# Patient Record
Sex: Female | Born: 1945 | Race: White | Hispanic: No | Smoking: Never smoker
Health system: Southern US, Community
[De-identification: ages and names within clinical notes are randomized; demographics above are authoritative.]

## PROBLEM LIST (undated history)

## (undated) DIAGNOSIS — I447 Left bundle-branch block, unspecified: Secondary | ICD-10-CM

## (undated) DIAGNOSIS — K635 Polyp of colon: Secondary | ICD-10-CM

## (undated) DIAGNOSIS — I77810 Thoracic aortic ectasia: Secondary | ICD-10-CM

## (undated) DIAGNOSIS — M1990 Unspecified osteoarthritis, unspecified site: Secondary | ICD-10-CM

## (undated) HISTORY — PX: CHOLECYSTECTOMY: SHX55

## (undated) HISTORY — PX: TONSILLECTOMY: SHX5217

## (undated) HISTORY — DX: Polyp of colon: K63.5

---

## 1998-09-30 ENCOUNTER — Emergency Department (HOSPITAL_COMMUNITY): Admission: EM | Admit: 1998-09-30 | Discharge: 1998-09-30 | Payer: Self-pay | Admitting: Emergency Medicine

## 1998-12-01 ENCOUNTER — Encounter: Payer: Self-pay | Admitting: General Surgery

## 1998-12-02 ENCOUNTER — Inpatient Hospital Stay (HOSPITAL_COMMUNITY): Admission: EM | Admit: 1998-12-02 | Discharge: 1998-12-03 | Payer: Self-pay | Admitting: Emergency Medicine

## 2001-05-23 ENCOUNTER — Encounter (INDEPENDENT_AMBULATORY_CARE_PROVIDER_SITE_OTHER): Payer: Self-pay | Admitting: *Deleted

## 2001-05-23 ENCOUNTER — Ambulatory Visit (HOSPITAL_COMMUNITY): Admission: RE | Admit: 2001-05-23 | Discharge: 2001-05-23 | Payer: Self-pay | Admitting: Gastroenterology

## 2001-07-30 ENCOUNTER — Other Ambulatory Visit: Admission: RE | Admit: 2001-07-30 | Discharge: 2001-07-30 | Payer: Self-pay | Admitting: Obstetrics and Gynecology

## 2001-08-30 ENCOUNTER — Encounter: Payer: Self-pay | Admitting: *Deleted

## 2001-08-30 ENCOUNTER — Encounter: Admission: RE | Admit: 2001-08-30 | Discharge: 2001-08-30 | Payer: Self-pay | Admitting: *Deleted

## 2001-11-13 ENCOUNTER — Ambulatory Visit (HOSPITAL_COMMUNITY): Admission: RE | Admit: 2001-11-13 | Discharge: 2001-11-13 | Payer: Self-pay | Admitting: Gastroenterology

## 2004-05-26 ENCOUNTER — Other Ambulatory Visit: Admission: RE | Admit: 2004-05-26 | Discharge: 2004-05-26 | Payer: Self-pay | Admitting: Family Medicine

## 2004-05-26 LAB — HM PAP SMEAR: HM Pap smear: NORMAL

## 2005-04-04 ENCOUNTER — Ambulatory Visit (HOSPITAL_COMMUNITY): Admission: RE | Admit: 2005-04-04 | Discharge: 2005-04-04 | Payer: Self-pay | Admitting: Gastroenterology

## 2007-01-25 ENCOUNTER — Ambulatory Visit: Payer: Self-pay | Admitting: Family Medicine

## 2007-09-24 ENCOUNTER — Ambulatory Visit: Payer: Self-pay | Admitting: Family Medicine

## 2007-12-18 ENCOUNTER — Ambulatory Visit: Payer: Self-pay | Admitting: Family Medicine

## 2007-12-27 LAB — CONVERTED CEMR LAB: Pap Smear: NORMAL

## 2008-02-14 ENCOUNTER — Ambulatory Visit: Payer: Self-pay | Admitting: Family Medicine

## 2008-05-02 ENCOUNTER — Ambulatory Visit: Payer: Self-pay | Admitting: Family Medicine

## 2008-05-30 ENCOUNTER — Ambulatory Visit: Payer: Self-pay | Admitting: Family Medicine

## 2009-07-14 ENCOUNTER — Ambulatory Visit: Payer: Self-pay | Admitting: Family Medicine

## 2009-12-07 ENCOUNTER — Encounter: Payer: Self-pay | Admitting: Internal Medicine

## 2010-02-03 ENCOUNTER — Ambulatory Visit: Payer: Self-pay | Admitting: Internal Medicine

## 2010-02-03 DIAGNOSIS — D126 Benign neoplasm of colon, unspecified: Secondary | ICD-10-CM

## 2010-02-03 DIAGNOSIS — G479 Sleep disorder, unspecified: Secondary | ICD-10-CM | POA: Insufficient documentation

## 2010-02-03 DIAGNOSIS — E785 Hyperlipidemia, unspecified: Secondary | ICD-10-CM

## 2010-02-03 DIAGNOSIS — E559 Vitamin D deficiency, unspecified: Secondary | ICD-10-CM | POA: Insufficient documentation

## 2010-02-07 DIAGNOSIS — Z860100 Personal history of colon polyps, unspecified: Secondary | ICD-10-CM | POA: Insufficient documentation

## 2010-07-30 ENCOUNTER — Encounter: Payer: Self-pay | Admitting: Cardiovascular Disease

## 2010-07-30 ENCOUNTER — Ambulatory Visit: Payer: Self-pay

## 2010-07-30 ENCOUNTER — Telehealth: Payer: Self-pay | Admitting: *Deleted

## 2010-07-30 ENCOUNTER — Ambulatory Visit: Payer: Self-pay | Admitting: Internal Medicine

## 2010-07-30 DIAGNOSIS — M79609 Pain in unspecified limb: Secondary | ICD-10-CM

## 2010-08-02 ENCOUNTER — Ambulatory Visit: Payer: Self-pay | Admitting: Family Medicine

## 2010-08-02 ENCOUNTER — Telehealth (INDEPENDENT_AMBULATORY_CARE_PROVIDER_SITE_OTHER): Payer: Self-pay | Admitting: *Deleted

## 2010-08-02 DIAGNOSIS — F329 Major depressive disorder, single episode, unspecified: Secondary | ICD-10-CM

## 2010-08-03 ENCOUNTER — Encounter: Payer: Self-pay | Admitting: Family Medicine

## 2010-09-13 ENCOUNTER — Telehealth (INDEPENDENT_AMBULATORY_CARE_PROVIDER_SITE_OTHER): Payer: Self-pay | Admitting: *Deleted

## 2010-09-14 ENCOUNTER — Encounter
Admission: RE | Admit: 2010-09-14 | Discharge: 2010-11-12 | Payer: Self-pay | Source: Home / Self Care | Admitting: Family Medicine

## 2010-09-16 ENCOUNTER — Encounter: Payer: Self-pay | Admitting: Internal Medicine

## 2010-12-07 ENCOUNTER — Encounter: Payer: Self-pay | Admitting: Internal Medicine

## 2011-01-25 NOTE — Assessment & Plan Note (Signed)
Summary: leg swelling and pain/ssc   Vital Signs:  Patient profile:   65 year old female Menstrual status:  postmenopausal Height:      61.25 inches (155.57 cm) Weight:      190 pounds (86.36 kg) O2 Sat:      96 % on Room air Temp:     98.3 degrees F (36.83 degrees C) oral Pulse rate:   79 / minute BP sitting:   118 / 82  (left arm) Cuff size:   regular  Vitals Entered By: Josph Macho RMA (August 02, 2010 1:59 PM)  O2 Flow:  Room air CC: Left leg swelling and pain/ pt states she has not picked up the Prednisone or Vicodin/ CF Is Patient Diabetic? No   History of Present Illness: Patient in today for evaluation of some persistent left leg pain. She has been in recently and had the pain and some swelling evaluated. DVT has been ruled out but her pain is still present. It is migratory and at times she points to her posterior left hip then she will point to her knee then behind her calf and now she says the pain is the worst around her ankle at present. Before she can say much more she starts to cry and she says she can barely function or manage any pain or stress right now because it is coming up on the anniversary of her dead daughter's birthday. Her daughter was murdered in her home in 2006 by a stalker and the patient was the one who found her. She has been through grief counselling but she not been ever tried an antidepressant that she can remember. She does not believe it would help because she is not depressed but rather she is grieving. Regarding the leg it began to hurt in posterior hip prior to an 18 day trip to [place] during the trip the pain worsened and became migratory as noted. She did suffer a [place]  She has a bruise and some discomfort on her left inner thigh. She denies nay increased pain or swelling in her calf since her doppler study was completed. No CP/palp/SOB  Current Medications (verified): 1)  Ambien 10 Mg Tabs (Zolpidem  Tartrate) .[name] Kitchen.. 1 By Mouth Once Daily 2)  Prednisone 20 Mg Tabs (Prednisone) .... 3 Once Daily X 1, 2 Once Daily X 2, 1 Once Daily X 2, 1/2 Once Daily X 2 3)  Vicodin 5-500 Mg Tabs (Hydrocodone-Acetaminophen) .[name] Kitchen.. 1 Q 4-6 Hours As Needed Pain  Allergies (verified): No Known Drug Allergies  Past History:  Past medical history reviewed for relevance to current acute and chronic problems. Social history (including risk factors) reviewed for relevance to current acute and chronic problems.  Past Medical History: Reviewed history from 02/03/2010 and no changes required. G3 P2 Colonic polyps, hx of  Social History: Reviewed history from 02/03/2010 and no changes required. Occupation: Retired  Tourist information centre manager Divorced  Never Smoked Alcohol use-no Drug use-no Regular exercise-yes Hh of 1   cat just died.   Review of Systems      See HPI  Physical Exam  General:  Well-developed,well-nourished,in no acute distress; alert,appropriate and cooperative throughout examination Head:  Normocephalic and atraumatic without obvious abnormalities. No apparent alopecia or balding. Mouth:  Oral mucosa and oropharynx without lesions or exudates.  Teeth in good repair. Neck:  No deformities, masses, or tenderness noted. Lungs:  Normal respiratory effort, chest expands symmetrically.  Lungs are clear to auscultation, no crackles or wheezes. Heart:  Normal rate and regular rhythm. S1 and S2 normal without gallop, murmur, click, rub or other extra sounds. Abdomen:  Bowel sounds positive,abdomen soft and non-tender without masses, organomegaly or hernias noted. Msk:  No deformity or scoliosis noted of thoracic or lumbar spine.  Mild pain with palpation over left posterior iliac joint. No pain with straight leg raise or int/ext rotation at b/l hips. No joint line tenderness or ligamentous laxity of knees. FROM in b/l LE. 1+ pedsal edema on left, 1 on right. Pulses:  R and L, posterior tibial pulses are full  and equal bilaterally Extremities:  No clubbing, cyanosis, edema, or deformity noted with normal full range of motion of all joints.   Neurologic:  No cranial nerve deficits noted. Station and gait are normal. Plantar reflexes are down-going bilaterally. DTRs are symmetrical throughout. Sensory, motor and coordinative functions appear intact. Psych:  dysphoric affect, tearful, and moderately anxious.     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 She is offered a trial course of an SSRI to try and declines for now. Will consider in the future. She does agree to some Diazepam for the pain and grief to use sporadically and she is set up to come back and see her PMD for further follow up next month. She is to calland come in sooner as needed  Complete Medication List: 1)  Ambien 10 Mg Tabs (Zolpidem tartrate) .[name] Kitchen.. 1 by mouth once daily 2)  Prednisone 20 Mg Tabs (Prednisone) .... 3 once daily x 1, 2 once daily x 2, 1 once daily x 2, 1/2 once daily x 2 3)  Vicodin 5-500 Mg Tabs (Hydrocodone-acetaminophen) .[name] Kitchen.. 1 q 4-6 hours as needed pain 4)  Naprosyn 375 Mg Tabs (Naproxen) .[name] Kitchen.. 1 tab by mouth two times a day as needed pain with food 5)  Diazepam 5 Mg Tabs (Diazepam) .... 1/2 to 2 tabs by mouth two times a day as needed pain and anxiety  Patient Instructions: 1)  Please schedule a follow-up appointment in 1 month. Dr Fabian Sharp 2)  Use Naproxen, stronger formof Aleve,  two times a day and then if pain  persists take the Valium, generic name is Diazepam, 1 to 2 x daily as needed  3)  Take 650 - 1000 mg of tylenol every 4-6 hours as needed for relief of pain or comfort of fever. Avoid taking more than 3000 mg in a 24 hour period( can cause liver damage in higher doses).  4)  Apply moist heat to Left hip andleg as needed and then gentle stretching Prescriptions: DIAZEPAM 5 MG TABS (DIAZEPAM) 1/2 to 2 tabs by mouth two times a day as needed pain and anxiety  #60 x 1   Entered and Authorized by:   Danise Edge MD   Signed by:   Danise Edge MD on 08/02/2010   Method used:   Print then Give to Patient   RxID:   [id number]  NAPROSYN 375 MG TABS (NAPROXEN) 1 tab by mouth two times a day as needed pain with food  #60 x 3   Entered and Authorized by:   Danise Edge MD   Signed by:   Danise Edge MD on 08/02/2010   Method used:   [facility]        [facility] [address] [phone]* (retail)       [address]       [place], [place]  [address]       Ph: [phone] or [phone]       Fax: [phone]   RxID:   [id number]

## 2011-01-25 NOTE — Assessment & Plan Note (Signed)
Summary: left swollen leg//ccm   Vital Signs:  Patient profile:   65 year old female Menstrual status:  postmenopausal Weight:      190 pounds Temp:     98.3 degrees F oral Pulse rate:   78 / minute BP sitting:   120 / 80  (left arm) Cuff size:   regular  Vitals Entered By: Romualdo Bolk, CMA (AAMA) (July 30, 2010 10:48 AM) CC: Left leg swollen and painful in the calf area. This has been going x 1 month. Pt states that it feels heavy. Pt states that occ. the pain radiates up to her hip and down to her ankle. Pt fell while in [place] 1 1/2 weeks ago but was having pain before then. Then 2 days later she couldn't walk on her leg.   History of Present Illness: Samantha [name] comes in today  for acute visit SDA  after onset of above. Just came back  from [place] .from a    21  days  .   trip .She has had left leg thigh  and poss buttock  pain  a bout a month  worse  left leg behind knee and upper post thigh .  When overseas  she  fell ;tripped at [place] about 2 weeks a go  but able to walk ok  then  . However  2 days later pain  in leg was so bad couldnt walk.   Since that time has been battling with the pain . Co of some swelling also  Has been taking a lot of aleve.   Pain 5/10  . Not getting better . feels swo,llen . knee ok .No bleeding . Just came home  from [place] 2 days.  ago.    Gets some swelling in the left leg . No hx of blood clots or Cp ,sob,  cough .     Preventive Screening-Counseling & Management  Alcohol-Tobacco     Alcohol drinks/day: 0     Smoking Status: never  Caffeine-Diet-Exercise     Caffeine use/day: 3     Does Patient Exercise: yes     Type of exercise: walking  Current Medications (verified): 1)  Ambien 10 Mg Tabs (Zolpidem Tartrate) .[name] Kitchen.. 1 By Mouth Once Daily  Allergies (verified): No Known Drug Allergies  Past History:  Past medical, surgical, family and social histories (including risk factors) reviewed, and no changes noted (except as noted  below).  Past Medical History: Reviewed history from 02/03/2010 and no changes required. G3 P2 Colonic polyps, hx of  Past Surgical History: Reviewed history from 02/03/2010 and no changes required. Cholecystectomy Tonsillectomy  Past History:  Care Management: Gastroenterology: [facility] [address] [facility] Dr Domingo Madeira RN gyne  Family History: Reviewed history from 02/03/2010 and no changes required. Father: Decease from asbestoes related disease  Mother: Heart attack  33  also had HBP. Siblings: Sister- Died from Colon Cancer   64 Daughter  murdered.  neg  clotting   Social History: Reviewed history from 02/03/2010 and no changes required. Occupation: Retired  Tourist information centre manager Divorced  Never Smoked Alcohol use-no Drug use-no Regular exercise-yes Hh of 1   cat just died.   Review of Systems       The patient complains of peripheral edema.  The patient denies anorexia, fever, weight loss, weight gain, vision loss, decreased hearing, hoarseness, chest pain, syncope, dyspnea on exertion, prolonged cough, abdominal pain, melena, hematochezia, severe indigestion/heartburn, hematuria, muscle weakness, transient blindness, unusual weight  change, abnormal bleeding, enlarged lymph nodes, and angioedema.    Physical Exam  General:  alert, well-developed, well-nourished, and well-hydrated.   Head:  normocephalic and atraumatic.   Eyes:  vision grossly intact, pupils equal, and pupils round.   Ears:  R ear normal, L ear normal, and no external deformities.   Neck:  No deformities, masses, or tenderness noted. Lungs:  Normal respiratory effort, chest expands symmetrically. Lungs are clear to auscultation, no crackles or wheezes. Heart:  Normal rate and regular rhythm. S1 and S2 normal without gallop, murmur, click, rub or other extra sounds. Msk:  no joint swelling, no joint warmth, and no redness over joints.    tender  medial and behind upper calf  Pulses:  nl cpa refill   Extremities:  tc to 1+ edema left foot leg .   Neurologic:  alert & oriented X3 and strength normal in all extremities.  mildy antalgic gait  Skin:  turgor normal and color normal.   bruising over medial left thigh above knee and medial below knee no hematoma and noredness or cord.   Psych:  Oriented X3, good eye contact, tearful, and slightly anxious.     Impression & Recommendations:  Problem # 1:  LEG PAIN, LEFT (ICD-729.5) doppler asap  then follow up plan    .   r/o DVT   could be neuro based pain.   plan follow up after results back .  no risk factors except travel.   Orders: Doppler Referral (Doppler)  Patient Instructions: 1)  we will contact you about the doppler test. if it is negative we can use pain medication and follow up  .   as appropriate.

## 2011-01-25 NOTE — Letter (Signed)
Summary: Generic Letter  [place] [facility]  [address] [place], [place] [address]   Phone: [phone]  Fax: [phone]    08/03/2010  MARIECLAIRE BETTENHAUSEN [address] [place], [place]  [address]   Dear Ms. Muller,  As discussed per our conversation as you requested a letter stating what was found in your xray: Plantar cancaneal spur, and some other bone spurs all consistent with arthritc change, but no acute fracture or subluxation in any xrays.  Any further questions please contact our office at [phone].      Sincerely,   Josph Macho RMA

## 2011-01-25 NOTE — Progress Notes (Signed)
Summary: pt referral

## 2011-01-25 NOTE — Progress Notes (Signed)
Summary: Pt wants a earlier appt than wed and x-ray done  Phone Note Call from Patient Call back at Home Phone [phone]   Caller: Patient Summary of Call: Pt is wanting to coming in before Wed. Pt is very concerned about having a broken bone or something. It is swollen, painful and heavy. Pt didn't taken predisone or vicodin. Pt would like to have a x-ray done if possible. Initial call taken by: Romualdo Bolk, CMA Duncan Dull),  August 02, 2010 10:47 AM  Follow-up for Phone Call        see if Dr. Abner Greenspan can do this, we are full today Follow-up by: Nelwyn Salisbury MD,  August 02, 2010 12:56 PM  Additional Follow-up for Phone Call Additional follow up Details #1::        Informed Carollee Herter to have pt come in now. Additional Follow-up by: Josph Macho RMA,  August 02, 2010 1:30 PM    Additional Follow-up for Phone Call Additional follow up Details #2::    Pt aware of appt now. Pt is on her way. Follow-up by: Romualdo Bolk, CMA (AAMA),  August 02, 2010 1:35 PM

## 2011-01-25 NOTE — Miscellaneous (Signed)
Summary: Orders Update  Clinical Lists Changes  Orders: Added new Test order of Venous Duplex Lower Extremity (Venous Duplex Lower) - Signed 

## 2011-01-25 NOTE — Assessment & Plan Note (Signed)
Summary: BRAND NEW PT/TO ESTABLISH/CJR....PT RSC-BUMPED / RSD RSC APPT...   Vital Signs:  Patient profile:   65 year old female Menstrual status:  postmenopausal Height:      61.25 inches Weight:      189 pounds BMI:     35.55 Pulse rate:   72 / minute BP sitting:   120 / 80  (right arm) Cuff size:   regular  Vitals Entered By: Romualdo Bolk, CMA (AAMA) (February 03, 2010 2:24 PM) CC: New Pt to get establish LMP - Character: age 62     Menstrual Status postmenopausal Last PAP Result normal   History of Present Illness: Tehilla Coffel comesin today  to establish    as a new patient. has seen Dr Susann Givens in the past  2 years.   [place] since retiring.    Had seen Dr Orson Aloe in the remote past. Last  time for a check up. LIPIds  Have been up some but hasnt been on meds  Weight gain   Weight watcher just  started.   this past month. implementing healthy lifestyle intervention   Sleep :  hasnt been very good since the murder  of her young adult daughter in her place of residence     Take sleep aids once in a while.    ambien as needed.    2 x per week.   Coping and not stuck in depression.  has some support system.   Labs done per gyne 12 10 Low vit d 11 and elevated lipids  226 tg 163 hdl 61   ldl132 ratio is 3.7   Preventive Care Screening  Colonoscopy:    Date:  12/27/2007    Results:  normal   Mammogram:    Date:  12/27/2007    Results:  normal   Pap Smear:    Date:  12/27/2007    Results:  normal    Contraindications/Deferment of Procedures/Staging:    Test/Procedure: TD vaccine    Reason for deferment: declined   Preventive Screening-Counseling & Management  Alcohol-Tobacco     Alcohol drinks/day: 0     Smoking Status: never  Caffeine-Diet-Exercise     Caffeine use/day: 3     Does Patient Exercise: yes     Type of exercise: walking  Hep-HIV-STD-Contraception     Sun Exposure-Excessive:  no  Safety-Violence-Falls     Seat Belt Use: yes     Firearms in the Home: no firearms in the home     Smoke Detectors: yes      Drug Use:  no.        Blood Transfusions:  no.    Current Medications (verified): 1)  Ambien 10 Mg Tabs (Zolpidem Tartrate) .[name] Kitchen.. 1 By Mouth Once Daily  Allergies (verified): No Known Drug Allergies  Past History:  Past Medical History: G3 P2 Colonic polyps, hx of  Past Surgical History: Cholecystectomy Tonsillectomy  Past History:  Care Management: Gastroenterology: [facility] Dr Domingo Madeira RN gyne  Family History: Father: Decease from asbestoes related disease  Mother: Heart attack  38  also had HBP. Siblings: Sister- Died from Colon Cancer   59 Daughter  murdered.  Social History: Occupation: Retired  Tourist information centre manager Divorced  Never Smoked Alcohol use-no Drug use-no Regular exercise-yes Hh of 1   cat just died.  Smoking Status:  never Caffeine use/day:  3 Does Patient  Exercise:  yes Occupation:  employed Drug Use:  no Risk analyst Use:  yes Sun Exposure-Excessive:  no Blood Transfusions:  no  Review of Systems  The patient denies anorexia, fever, weight loss, weight gain, vision loss, decreased hearing, hoarseness, chest pain, syncope, dyspnea on exertion, peripheral edema, prolonged cough, headaches, hemoptysis, abdominal pain, melena, hematochezia, severe indigestion/heartburn, hematuria, muscle weakness, transient blindness, difficulty walking, depression, unusual weight change, abnormal bleeding, enlarged lymph nodes, angioedema, and breast masses.    Physical Exam  General:  Well-developed,well-nourished,in no acute distress; alert,appropriate and cooperative throughout examination Head:  normocephalic and atraumatic.   Eyes:  vision grossly intact and pupils equal.   Ears:  R ear normal and L ear normal.   Mouth:  low lying palate  no lesions Neck:  No deformities, masses, or tenderness noted. Lungs:  Normal  respiratory effort, chest expands symmetrically. Lungs are clear to auscultation, no crackles or wheezes.no dullness.   Heart:  Normal rate and regular rhythm. S1 and S2 normal without gallop, murmur, click, rub or other extra sounds.no lifts.   Abdomen:  Bowel sounds positive,abdomen soft and non-tender without masses, organomegaly or hernias noted. Msk:  no joint warmth and no redness over joints.   Pulses:  pulses intact without delay   Extremities:  no clubbing cyanosis or edema  Neurologic:  alert & oriented X3, strength normal in all extremities, and gait normal.   Skin:  turgor normal, color normal, no ecchymoses, and no petechiae.   Cervical Nodes:  No lymphadenopathy noted Psych:  Oriented X3, memory intact for recent and remote, normally interactive, good eye contact, not anxious appearing, and not depressed appearing.   labs reviewed   Impression & Recommendations:  Problem # 1:  HYPERLIPIDEMIA (ICD-272.4) Labs done per gyne 12 10 Low vit d 11 and elevated lipids  226 tg 163 hdl 61   ldl132 ratio is 3.7    rec lifestyle intervention   Problem # 2:  SLEEP DISORDER (ICD-780.50) prob reactive and  habit  . disc options but can rx her meds .   lifestyle intervention and sleep hygiene. Murder of her adult child  could have initiated  sleep diruption.  coping seemingly normal for the situation  Problem # 3:  VITAMIN D DEFICIENCY (ICD-268.9) rx by gyne   very low level.     Problem # 4:  Preventive Health Care (ICD-V70.0) disc  zostavax to be given today.  Complete Medication List: 1)  Ambien 10 Mg Tabs (Zolpidem tartrate) .[name] Kitchen.. 1 by mouth once daily  Other Orders: Zoster (Shingles) Vaccine Live [phone]) Admin 1st Vaccine (60454)  Patient Instructions: 1)  agree  with  healthy diet and weight loss.  2)  Get tdap when  you are ready.  3)  rov in 6 months  Prescriptions: AMBIEN 10 MG TABS (ZOLPIDEM TARTRATE) 1 by mouth once daily  #30 x 3   Entered and Authorized by:   Madelin Headings MD   Signed by:   Madelin Headings MD on 02/03/2010   Method used:   Print then Give to Patient   RxID:   [id number]    Immunizations Administered:  Zostavax # 1:    Vaccine Type: Zostavax    Site: right deltoid    Mfr: Merck    Dose: 0.5 ml    Route: [place]    Given by: Romualdo Bolk, CMA (AAMA)    Exp. Date: 01/13/2011    Lot #: [id number]

## 2011-01-25 NOTE — Progress Notes (Signed)
Summary: Doppler neg for DVT  Phone Note From Other Clinic   Caller: [facility] Summary of Call: Neg. for DVT- Pt is aware of this. Initial call taken by: Romualdo Bolk, CMA (AAMA),  July 30, 2010 3:47 PM  Follow-up for Phone Call        rec we try a pain med  and  prednisone incase this is a pinched nerve. prednisone 20 mg   3 by mouth   day 1 then 2 by mouth for 2 days then 1 by mouth once daily for 2 days then 1/2 by mouth once daily for 2 days . disp 15#  also  vicodin 5 500   1 by mouth q4-6 hours as needed pain disp 20   return office visit    with Dr Clent Ridges. next week(he is aware)   as I will be out of office  Follow-up by: Madelin Headings MD,  July 30, 2010 4:00 PM  Additional Follow-up for Phone Call Additional follow up Details #1::        Pt aware and  appt made. [facility] Additional Follow-up by: Romualdo Bolk, CMA (AAMA),  July 30, 2010 4:40 PM    New/Updated Medications: PREDNISONE 20 MG TABS (PREDNISONE) 3 once daily x 1, 2 once daily x 2, 1 once daily x 2, 1/2 once daily x 2 VICODIN 5-500 MG TABS (HYDROCODONE-ACETAMINOPHEN) 1 q 4-6 hours as needed pain Prescriptions: VICODIN 5-500 MG TABS (HYDROCODONE-ACETAMINOPHEN) 1 q 4-6 hours as needed pain  #20 x 0   Entered by:   Romualdo Bolk, CMA (AAMA)   Authorized by:   Madelin Headings MD   Signed by:   Romualdo Bolk, CMA (AAMA) on 07/30/2010   Method used:   Telephoned to ...       [facility] [address] [phone]* (retail)       [address]       [place], [place]  [address]       Ph: [phone] or [phone]       Fax: [phone]   RxID:   [id number] PREDNISONE 20 MG TABS (PREDNISONE) 3 once daily x 1, 2 once daily x 2, 1 once daily x 2, 1/2 once daily x 2  #15 x 0   Entered by:   Romualdo Bolk, CMA (AAMA)   Authorized by:   Madelin Headings MD   Signed by:   Romualdo Bolk, CMA (AAMA) on 07/30/2010   Method used:   Telephoned to  ...       [facility] [address] [phone]* (retail)       [address]       [place], [place]  [address]       Ph: [phone] or [phone]       Fax: [phone]   RxID:   [id number]

## 2011-01-25 NOTE — Miscellaneous (Signed)
Summary: Initial Summary for PT Services/[facility]  Initial Summary for PT Services/[facility]   Imported By: Maryln Gottron 09/20/2010 13:00:44  _____________________________________________________________________  External Attachment:    Type:   Image     Comment:   External Document

## 2011-01-27 NOTE — Miscellaneous (Signed)
Summary: Initial Summary for PT Services/[facility]   Initial Summary for PT Services/[facility]   Imported By: Maryln Gottron 12/10/2010 10:39:13  _____________________________________________________________________  External Attachment:    Type:   Image     Comment:   External Document

## 2011-05-13 NOTE — Op Note (Signed)
[name], [name]                 ACCOUNT NO.:  [id number]   MEDICAL RECORD NO.:  [id number]          PATIENT TYPE:  AMB   LOCATION:  ENDO                         FACILITY:  [facility]   PHYSICIAN:  Anselmo Rod, M.D.  DATE OF BIRTH:  1946-09-14   DATE OF PROCEDURE:  04/04/2005  DATE OF DISCHARGE:                                 OPERATIVE REPORT   PROCEDURE PERFORMED:  Screening colonoscopy.   ENDOSCOPIST:  Charna Elizabeth, M.D.   INSTRUMENT USED:  Olympus video colonoscope.   INDICATIONS FOR PROCEDURE:  The patient is a 65 year old white female with a  history of tubulovillous adenoma and a family history of colon cancer in her  sister with metastatic disease.  Undergoing repeat colonoscopy to rule out  recurrent polyps.   PREPROCEDURE PREPARATION:  Informed consent was procured from the patient.  The patient was fasted for eight hours prior to the procedure and prepped  with a bottle of magnesium citrate and a gallon of GoLYTELY the night prior  to the procedure.  The patient had lost her prep instructions and was  sipping her prep before the procedure.   PREPROCEDURE PHYSICAL:  The patient had stable vital signs.  Neck supple.  Chest clear to auscultation.  S1 and S2 regular.  Abdomen soft with normal  bowel sounds.   DESCRIPTION OF PROCEDURE:  The patient was placed in left lateral decubitus  position and sedated with 80 mg of Demerol and 10 mg of Versed in slow  incremental doses.  Once the patient was adequately sedated and maintained  on low flow oxygen and continuous cardiac monitoring, the Olympus video  colonoscope was advanced from the rectum to cecum with difficulty.  There  was a large amount of residual stool in the colon.  Multiple washes were  done.  Small lesions could be missed.  The appendicular orifice and  ileocecal valve were clearly visualized and photographed.  The terminal  ileum appeared healthy and without lesions.  Retroflexion in the rectum  revealed  small internal hemorrhoids.  No masses, polyps, erosions,  ulcerations or diverticula were seen.  The patient tolerated the procedure  well without complication.   IMPRESSION:  1.  Essentially unrevealing colonoscopy up to terminal ileum except for      small internal hemorrhoids.  2.  No masses, polyps, or diverticula were seen.  3.  Large amount of residual stool in the colon.  Multiple washes were done.      Small lesions could be missed.   RECOMMENDATIONS:  1.  Continue high fiber diet with liberal fluid intake.  2.  Repeat colonoscopy is recommended in the next five years unless the      patient develops any abnormal symptoms in the interim.  3.  Outpatient followup as need arises in the future.      JNM/MEDQ  D:  04/04/2005  T:  04/04/2005  Job:  [id number]   cc:   Talmadge Coventry, M.D.  [address]  [place]  [place] [address]  Fax: [phone]

## 2011-05-13 NOTE — Procedures (Signed)
[place]  Patient:    Samantha [name], Samantha [name] Visit Number: [id number] MRN: [id number]          Service Type: END Location: ENDO Attending Physician:  Charna Elizabeth Dictated by:   Anselmo Rod, M.D. Proc. Date: 11/13/01 Admit Date:  11/13/2001   CC:         Heather Roberts, M.D.   Procedure Report  DATE OF BIRTH:  January 22, 1946.  PROCEDURE:  Flexible sigmoidoscopy.  ENDOSCOPIST:  Anselmo Rod, M.D.  INSTRUMENT USED:  Olympus video colonoscope.  INDICATION FOR PROCEDURE:  Follow-up flexible sigmoidoscopy being done in a 65 year old white female when a large tubulovillous adenoma was removed in May of this year.  Flexible sigmoidoscopy is being done to rule out recurrence.  PREPROCEDURE PREPARATION:  Informed consent was procured from the patient. The patient was fasted for eight hours prior to the procedure and prepped with a bottle of Fleets Phospho-Soda the night prior to the procedure.  PREPROCEDURE PHYSICAL:  VITAL SIGNS:  The patient had stable vital signs.  NECK:  Supple.  CHEST:  Clear to auscultation.  S1, S2 regular.  ABDOMEN:  Soft with normal bowel sounds.  DESCRIPTION OF PROCEDURE:  The patient was placed in the left lateral decubitus position.  No sedation was used.  Once the patient was adequately positioned, the Olympus video colonoscope was advanced from the rectum to 50 cm without difficulty.  The prep was excellent.  No masses, polyps, erosions, or ulcerations were seen.  The patient had some early left-sided diverticulosis.  IMPRESSION: 1. Left-sided diverticulosis. 2. No masses or polyps seen.  RECOMMENDATIONS:  Repeat colorectal cancer screening is recommended in the next three years unless the patient were to develop any abnormal symptoms in the interim. Dictated by:   Anselmo Rod, M.D. Attending Physician:  Charna Elizabeth DD:  11/13/01 TD:  11/13/01 Job: [id number]

## 2011-07-07 ENCOUNTER — Encounter: Payer: Self-pay | Admitting: Family Medicine

## 2012-01-20 ENCOUNTER — Encounter: Payer: Self-pay | Admitting: Internal Medicine

## 2012-01-20 ENCOUNTER — Ambulatory Visit (INDEPENDENT_AMBULATORY_CARE_PROVIDER_SITE_OTHER): Payer: Medicare Other | Admitting: Internal Medicine

## 2012-01-20 VITALS — BP 130/90 | HR 94 | Temp 98.7°F | Ht 61.5 in | Wt 193.0 lb

## 2012-01-20 DIAGNOSIS — Z8249 Family history of ischemic heart disease and other diseases of the circulatory system: Secondary | ICD-10-CM

## 2012-01-20 DIAGNOSIS — J069 Acute upper respiratory infection, unspecified: Secondary | ICD-10-CM

## 2012-01-20 DIAGNOSIS — M546 Pain in thoracic spine: Secondary | ICD-10-CM

## 2012-01-20 DIAGNOSIS — G479 Sleep disorder, unspecified: Secondary | ICD-10-CM

## 2012-01-20 DIAGNOSIS — R0609 Other forms of dyspnea: Secondary | ICD-10-CM

## 2012-01-20 NOTE — Patient Instructions (Signed)
Will arrange cardiology opinion  About your sx Get a chest x ray at your convenience  I suspect that you viral rti will get worse  Before getting better . Call if  High fever or inc breathing difficulties.  Plan fasting labs   cpx in a month  Or so in the meantime

## 2012-01-20 NOTE — Progress Notes (Signed)
Subjective:    Patient ID: Samantha [name], female    DOB: 1946-07-13, 66 y.o.   MRN: [id number]  HPI Patient comes in today for SDA  For acute problem evaluation. Onset of cold this am   But that is not why she is here. Didn't get  Flu shot made her sick last time. Not feeling well recently and not taking care of herself very well Needs check up and hasnt been seen in a while but having DOE with some exertion . [name] Kitchen A little dizzy  Pressure in back when going up hills. For about 6-12 months . Notes  A change when going up steps 3 flights ; Flat   walking  [facility]   Describes the mid thoracic back discomfort as  squeezing  Pressure and some sob ? Of nausea. No reg exercise .  Recently  Travel and cough exposure.   Mom mi age 62  72 yo nephew had MI  Hx of abn chest? And then mri showed fatty liver   Review of Systems ROS:  GEN/ HEENTNo fever, significant weight changes sweats headaches vision problems hearing changes, CV/ PULM; No chest pain, syncope. Has some chronic swelling in legs no changes GI /GU: No adominal pain, vomiting, change in bowel habits. No blood in the stool. . Lots of burping   SKIN/HEME: ,no acute skin rashes suspicious lesions or bleeding. No lymphadenopathy, nodules, masses.  NEURO/ PSYCH:  No neurologic signs such as weakness numbness : Take ambien for sleep 1/2  For a few years   IMM/ Allergy: No unusual infections.  [name] Kitchen   REST of 12 system review negative as per hpi  Past history family history social history reviewed in the electronic medical record.   Past Surgical History  Procedure Date  . Cholecystectomy   . Tonsillectomy     Family History  Problem Relation Age of Onset  . Heart attack Mother     66  . Hypertension Mother   . Cancer Sister     colon - 22    No Known Allergies  Current Outpatient Prescriptions on File Prior to Visit  Medication Sig Dispense Refill  . zolpidem (AMBIEN CR) 6.25 MG CR tablet Take 6.25 mg by mouth at bedtime as needed.           BP 130/90  Pulse 94  Temp(Src) 98.7 F (37.1 C) (Oral)  Ht 5' 1.5" (1.562 m)  Wt 193 lb (87.544 kg)  BMI 35.88 kg/m2  SpO2 98%        Objective:   Physical Exam WDWN in nad  HEENT: Normocephalic ;atraumatic , Eyes;  PERRL, EOMs  Full, lids and conjunctiva clear,,Ears: no deformities, canals nl, TM landmarks normal, Nose: no deformity or discharge  Mouth : OP clear without lesion or edema . Neck: Supple without adenopathy or masses or bruits Chest:  Clear to A&P without wheezes rales or rhonchi CV:  S1-S2 no gallops or murmurs peripheral perfusion is normal Abdomen:  Sof,t normal bowel sounds without hepatosplenomegaly, no guarding rebound or masses no CVA tenderness No focal neur deficits   EKG nsr  Ns t wave anterior leads with dec r wave no qs  Otherwise no acute changes     Assessment & Plan:   Exertional thoracic pain with DOE  Slow change over the past year.  Many causes possible but concern as a CV cause. Has  Remote hx of stress test over 5 years ago. No CLD  fam hx of cad  mom Plan to get cards opinion  Labs and check up soon   Recent rti early poss viral  Call if fever flu like illness or alarm features  Hx of elevated lipids   Will get labs in near future and FU   Sleep has been taking ambien per other  Hx of reactive insomina

## 2012-01-23 ENCOUNTER — Telehealth: Payer: Self-pay | Admitting: Family Medicine

## 2012-01-23 NOTE — Telephone Encounter (Signed)
[facility] Triage Call Report Triage Record Num: [id number] Operator: Griselda Miner Patient Name: Samantha [name] Call Date & Time: 01/21/2012 4:18:32PM Patient Phone: [phone] PCP: Neta Mends. Panosh Patient Gender: Female PCP Fax : [phone] Patient DOB: [birth year] [facility] Name: [facility] Reason for Call: Caller: [name]/Patient; PCP: Madelin Headings.; CB#: [phone]; Call regarding Abd pain;Had it yesterday but didn't mention to provider Saw provider yesterday cause not feeling well-states may have cough for a week; Afebrile,no N&V or diarrhea. Pain is across stomach. Took ASA and pain is better ( level 5 now), pain level is 8 on 0-10 scale without ASA; Has not eaten because is gassy and burping-took tums without relief; Abd is bloated and hard --tight like a drum denies appetite. Drinking well; last BM 01/20/12; lacks energy and just not feeling well. Triaged using Abdominal Pain with disposition to see ED immediately r/t to unbearable pain. Pt kept asking what was wrong and what the nurse thought-instructed to go to ED for evaluation since pain is constant and is described as at 8 without medication. Patient has a friend that will take her and states she will go. Protocol(s) Used: Abdominal Pain Recommended Outcome per Protocol: See ED Immediately Reason for Outcome: Unbearable abdominal/pelvic pain Care Advice: ~ Allow the patient to be in a position of comfort. ~ Do not eat or drink anything until evaluated by provider. Call EMS 911 if signs and symptoms of shock develop (such as unable to stand due to faintness, dizziness, or lightheadedness; new onset of confusion; slow to respond or difficult to awaken; skin is pale, gray, cool, or moist to touch; severe weakness; loss of consciousness). ~ ~ IMMEDIATE ACTION 01/21/2012 4:38:33PM Page 1 of 1 [email or web address]

## 2012-01-31 ENCOUNTER — Ambulatory Visit (INDEPENDENT_AMBULATORY_CARE_PROVIDER_SITE_OTHER)
Admission: RE | Admit: 2012-01-31 | Discharge: 2012-01-31 | Disposition: A | Discharge status: Home / Self Care | Payer: Medicare Other | Source: Ambulatory Visit | Attending: Internal Medicine | Admitting: Internal Medicine

## 2012-01-31 DIAGNOSIS — R0609 Other forms of dyspnea: Secondary | ICD-10-CM

## 2012-01-31 DIAGNOSIS — R0989 Other specified symptoms and signs involving the circulatory and respiratory systems: Secondary | ICD-10-CM

## 2012-01-31 DIAGNOSIS — M546 Pain in thoracic spine: Secondary | ICD-10-CM

## 2012-01-31 DIAGNOSIS — J069 Acute upper respiratory infection, unspecified: Secondary | ICD-10-CM

## 2012-02-01 ENCOUNTER — Other Ambulatory Visit: Payer: Self-pay | Admitting: Internal Medicine

## 2012-02-01 MED ORDER — LEVOFLOXACIN 750 MG PO TABS: 750.0000 mg | ORAL_TABLET | Freq: Every day | ORAL | Status: DC

## 2012-02-01 NOTE — Progress Notes (Signed)
Quick Note:  Spoke to pt and she is having a cough at this time but no fever at this time. I told pt to let me talk to Dr. Fabian Sharp to see what she wants [place] to do either the levaquin or CT then we would call her back. ______

## 2012-02-01 NOTE — Progress Notes (Signed)
Quick Note:  Per Dr. Fabian Sharp- have pt to take the levaquin and see her in 2 weeks. Left message on machine about this. Rx sent to pt's pharmacy. ______

## 2012-02-03 ENCOUNTER — Encounter: Payer: Self-pay | Admitting: Internal Medicine

## 2012-02-09 ENCOUNTER — Ambulatory Visit (INDEPENDENT_AMBULATORY_CARE_PROVIDER_SITE_OTHER): Payer: Medicare Other | Admitting: Cardiovascular Disease

## 2012-02-09 ENCOUNTER — Encounter: Payer: Self-pay | Admitting: Cardiovascular Disease

## 2012-02-09 VITALS — BP 132/84 | HR 81 | Ht 61.5 in | Wt 187.6 lb

## 2012-02-09 DIAGNOSIS — I209 Angina pectoris, unspecified: Secondary | ICD-10-CM

## 2012-02-09 DIAGNOSIS — R0989 Other specified symptoms and signs involving the circulatory and respiratory systems: Secondary | ICD-10-CM

## 2012-02-09 NOTE — Progress Notes (Signed)
    Samantha [name] Date of Birth  11/28/1946 [facility]      Office  [address]    [address]   [address] [place], [place]  [address]    [place], [place]  [address] [phone]  Fax  [phone]  [phone]  Fax [phone]  Problem List: 1. Exertional angina ( intrascapular pain)  History of Present Illness:  Samantha [name] is a 66 y.o. former Runner, broadcasting/film/video who presents today with a complaint of exertional angina. She has been having exertional intrascapular pain for the past several months. He typically hurts if she climbs several flights of stairs or walks up a hill.  There is no associated dyspnea or syncope.  I've seen her in the remote past. We'll perform a stress test results which cannot be found today but she reports that they were normal.   Current Outpatient Prescriptions on File Prior to Visit  Medication Sig Dispense Refill  . zolpidem (AMBIEN CR) 6.25 MG CR tablet Take 6.25 mg by mouth at bedtime as needed.          No Known Allergies  Past Medical History  Diagnosis Date  . Colonic polyp     Past Surgical History  Procedure Date  . Cholecystectomy   . Tonsillectomy     History  Smoking status  . Never Smoker   Smokeless tobacco  . Not on file    History  Alcohol Use No    Family History  Problem Relation Age of Onset  . Heart attack Mother     27  . Hypertension Mother   . Cancer Sister     colon - 60  . Other Daughter     murdered    Reviw of Systems:  Reviewed in the HPI.  All other systems are negative.  Physical Exam: Blood pressure 132/84, pulse 81, height 5' 1.5" (1.562 m), weight 187 lb 9.6 oz (85.095 kg). General: Well developed, well nourished, in no acute distress.  Head: Normocephalic, atraumatic, sclera non-icteric, mucus membranes are moist,   Neck: Supple. Negative for carotid bruits. JVD not elevated.  Lungs: Clear bilaterally to auscultation without wheezes, rales, or rhonchi. Breathing is  unlabored.  Heart: RRR with S1 S2. No murmurs, rubs, or gallops appreciated.  Abdomen: Soft, non-tender, non-distended with normoactive bowel sounds. No hepatomegaly. No rebound/guarding. No obvious abdominal masses.  Msk:  Strength and tone appear normal for age.  Extremities: No clubbing or cyanosis. No edema.  Distal pedal pulses are 2+ and equal bilaterally.  Neuro: Alert and oriented X 3. Moves all extremities spontaneously.  Psych:  Responds to questions appropriately with a normal affect.  ECG: NSR, no ST  Assessment / Plan:

## 2012-02-09 NOTE — Assessment & Plan Note (Addendum)
Samantha [name] presents with exertional intrascapular pain.  Her symptoms are worrisome for angina.  She has had a stress myoview in the past which was reportedly unremarkable. We'll schedule her for a repeat stress Myoview study. I'll see her again in the office on an as-needed basis follow.

## 2012-02-09 NOTE — Patient Instructions (Signed)
Your physician has requested that you have en exercise stress myoview. For further information please visit [email or web address] Please follow instruction sheet, as given.  Your physician recommends that you schedule a follow-up appointment in: AS NEEDED BASIS// WE WILL CALL YOU WITH STRESS TEST RESULTS

## 2012-02-10 ENCOUNTER — Other Ambulatory Visit (HOSPITAL_COMMUNITY)
Admission: RE | Admit: 2012-02-10 | Discharge: 2012-02-10 | Disposition: A | Discharge status: Home / Self Care | Payer: Medicare Other | Source: Ambulatory Visit | Attending: Internal Medicine | Admitting: Internal Medicine

## 2012-02-10 ENCOUNTER — Encounter: Payer: Self-pay | Admitting: Internal Medicine

## 2012-02-10 ENCOUNTER — Ambulatory Visit (INDEPENDENT_AMBULATORY_CARE_PROVIDER_SITE_OTHER): Payer: Medicare Other | Admitting: Internal Medicine

## 2012-02-10 VITALS — BP 110/80 | HR 72 | Ht 61.25 in | Wt 186.0 lb

## 2012-02-10 DIAGNOSIS — R0609 Other forms of dyspnea: Secondary | ICD-10-CM

## 2012-02-10 DIAGNOSIS — G479 Sleep disorder, unspecified: Secondary | ICD-10-CM

## 2012-02-10 DIAGNOSIS — Z124 Encounter for screening for malignant neoplasm of cervix: Secondary | ICD-10-CM | POA: Insufficient documentation

## 2012-02-10 DIAGNOSIS — F329 Major depressive disorder, single episode, unspecified: Secondary | ICD-10-CM

## 2012-02-10 DIAGNOSIS — R0989 Other specified symptoms and signs involving the circulatory and respiratory systems: Secondary | ICD-10-CM

## 2012-02-10 DIAGNOSIS — Z23 Encounter for immunization: Secondary | ICD-10-CM

## 2012-02-10 DIAGNOSIS — Z01419 Encounter for gynecological examination (general) (routine) without abnormal findings: Secondary | ICD-10-CM

## 2012-02-10 DIAGNOSIS — E785 Hyperlipidemia, unspecified: Secondary | ICD-10-CM

## 2012-02-10 DIAGNOSIS — E669 Obesity, unspecified: Secondary | ICD-10-CM

## 2012-02-10 DIAGNOSIS — Z0000 Encounter for general adult medical examination without abnormal findings: Secondary | ICD-10-CM

## 2012-02-10 DIAGNOSIS — R9389 Abnormal findings on diagnostic imaging of other specified body structures: Secondary | ICD-10-CM

## 2012-02-10 DIAGNOSIS — R918 Other nonspecific abnormal finding of lung field: Secondary | ICD-10-CM

## 2012-02-10 DIAGNOSIS — G478 Other sleep disorders: Secondary | ICD-10-CM

## 2012-02-10 DIAGNOSIS — M546 Pain in thoracic spine: Secondary | ICD-10-CM

## 2012-02-10 LAB — CBC WITH DIFFERENTIAL/PLATELET
Basophils Absolute: 0 10*3/uL (ref 0.0–0.1)
Hemoglobin: 12.9 g/dL (ref 12.0–15.0)
Lymphocytes Relative: 21.3 % (ref 12.0–46.0)
Monocytes Relative: 4.9 % (ref 3.0–12.0)
Neutro Abs: 7.7 10*3/uL (ref 1.4–7.7)
RBC: 4.12 Mil/uL (ref 3.87–5.11)
RDW: 12.8 % (ref 11.5–14.6)
WBC: 10.7 10*3/uL — ABNORMAL HIGH (ref 4.5–10.5)

## 2012-02-10 LAB — LIPID PANEL
Cholesterol: 220 mg/dL — ABNORMAL HIGH (ref 0–200)
HDL: 56.7 mg/dL (ref >39.00)
Total CHOL/HDL Ratio: 4
Triglycerides: 167 mg/dL — ABNORMAL HIGH (ref 0.0–149.0)
VLDL: 33.4 mg/dL (ref 0.0–40.0)

## 2012-02-10 LAB — BASIC METABOLIC PANEL
BUN: 8 mg/dL (ref 6–23)
Calcium: 9 mg/dL (ref 8.4–10.5)
Creatinine, Ser: 0.6 mg/dL (ref 0.4–1.2)
GFR: 108.62 mL/min (ref >60.00)
Glucose, Bld: 97 mg/dL (ref 70–99)

## 2012-02-10 LAB — TSH: TSH: 2.15 u[IU]/mL (ref 0.35–5.50)

## 2012-02-10 LAB — HEPATIC FUNCTION PANEL
AST: 19 U/L (ref 0–37)
Albumin: 3.5 g/dL (ref 3.5–5.2)

## 2012-02-10 LAB — LDL CHOLESTEROL, DIRECT: Direct LDL: 140.9 mg/dL

## 2012-02-10 NOTE — Progress Notes (Signed)
Subjective:    Patient ID: Samantha [name], female    DOB: Nov 07, 1946, 66 y.o.   MRN: [id number]  HPI Patient comes in today for preventive visit and follow-up of medical issues. Update of her history since her last visit.  Cough and resp is a lot better .    Back pain some better . But still there .  Saw cards and to have stess test  Also. Brings a old report chest ct in 2009 per dr Samantha [name] ; was normal.  This is a welcome to medicare check  Weight mood problematic  With decrease pleasure and not a lot to look forward to after her child died  Participated in hospice groups very helpful. Not isolated but lives alone in the home where her child was murdered. Does feel stuck at times not interested in meds as "every on e is on an antidepressant" and she has never had that problem  Samantha [name] for sleep     Hearing:  Ok   Vision:  No limitations at present . Wears glasses   Safety:  Has smoke detector and wears seat belts.  No firearms. No excess sun exposure. Sees dentist regularly.  Falls:  NOt in 6 months  Advance directive :  Reviewed    Memory: Felt to be good  , no concern from her or her family.  Depression: see hpi   Nutrition: Eats well balanced diet; adequate calcium and vitamin D. No swallowing chewiing problems. Eats too much . No motivation to  Lose weight.  Injury: no major injuries in the last six months.  Other healthcare providers:  Reviewed today .  Social:  HH of 1 . No pets.  Died   Preventive parameters: up-to-date on colonoscopy, mammogram,  Needs pap  and   ADLS:   There are no problems or need for assistance  driving, feeding, obtaining food, dressing, toileting and bathing, managing money using phone. She is independent.  Retired . teacher    Review of Systems ROS:  GEN/ HEENT: No fever, significant weight changes sweats headaches vision problems hearing changes, CV/ PULM; No chest pain now minor cough  No , syncope,edema  change in exercise  tolerance. GI /GU: No adominal pain, vomiting, change in bowel habits. No blood in the stool. No significant GU symptoms. SKIN/HEME: ,no acute skin rashes suspicious lesions or bleeding. No lymphadenopathy, nodules, masses.  NEURO/ PSYCH:  No neurologic signs such as weakness numbness.  IMM/ Allergy: No unusual infections.  Allergy .   REST of 12 system review negative except as per HPI   Outpatient Encounter Prescriptions as of 02/10/2012  Medication Sig Dispense Refill  . zolpidem (AMBIEN CR) 6.25 MG CR tablet Take 6.25 mg by mouth at bedtime as needed.         Past Medical History  Diagnosis Date  . Colonic polyp     History   Social History  . Marital Status: Divorced    Spouse Name: N/A    Number of Children: N/A  . Years of Education: N/A   Occupational History  . Not on file.   Social History Main Topics  . Smoking status: Never Smoker   . Smokeless tobacco: Not on file  . Alcohol Use: No  . Drug Use: No  . Sexually Active:    Other Topics Concern  . Not on file   Social History Narrative   Occupation: Retired Samantha [name] PHDDivorcedRegular exercise - not nowHh of 1 Samantha [name] murdered in her house  Past Surgical History  Procedure Date  . Cholecystectomy   . Tonsillectomy     Family History  Problem Relation Age of Onset  . Heart attack Mother     50  . Hypertension Mother   . Cancer Sister     colon - 33  . Other Samantha [name]     murdered    No Known Allergies  Current Outpatient Prescriptions on File Prior to Visit  Medication Sig Dispense Refill  . zolpidem (AMBIEN CR) 6.25 MG CR tablet Take 6.25 mg by mouth at bedtime as needed.          BP 110/80  Pulse 72  Ht 5' 1.25" (1.556 m)  Wt 186 lb (84.369 kg)  BMI 34.86 kg/m2       Objective:   Physical Exam Physical Exam: Vital signs reviewed WUJ:WJXB is a well-developed well-nourished alert cooperative  white female who appears her stated age in no acute distress.  HEENT: normocephalic  atraumatic , Eyes: PERRL EOM's full, conjunctiva clear, Nares: paten,t no deformity discharge or tenderness., Ears: no deformity EAC's clear TMs with normal landmarks. Mouth: clear OP, no lesions, edema.  Moist mucous membranes. Dentition in adequate repair. NECK: supple without masses, thyromegaly or bruits. CHEST/PULM:  Clear to auscultation and percussion breath sounds equal no wheeze , rales or rhonchi. No chest wall deformities or tenderness. Breast: normal by inspection . No dimpling, discharge, masses, tenderness or discharge . CV: PMI is nondisplaced, S1 S2 no gallops, murmurs, rubs. Peripheral pulses are full without delay.No JVD .  ABDOMEN: Bowel sounds normal nontender  No guard or rebound, no hepato splenomegal no CVA tenderness.  No hernia. Extremtities:  No clubbing cyanosis or edema, no acute joint swelling or redness no focal atrophy NEURO:  Oriented x3, cranial nerves 3-12 appear to be intact, no obvious focal weakness,gait within normal limits no abnormal reflexes or asymmetrical SKIN: No acute rashes normal turgor, color, no bruising or petechiae. PSYCH: Oriented, good eye contact, no obvious depression anxiety, cognition and judgment appear normal. LN: no cervical axillary inguinal adenopathy Pelvic: NL ext GU, labia clear without lesions or rash . Vagina no lesions .Cervix: clear  UTERUS: Neg hard to size from large abd wall CMT Adnexa:  clear no masses . PAP done  Rectal no masses stool heme negative     Assessment & Plan:  Preventive Health Care Counseled regarding healthy nutrition, exercise, sleep, injury prevention, calcium vit d and healthy weight . Obesity  Many factors counseled  Reactive mood complicated bereavement  Not suicidal Sleep related  No high risk behavior Back pain Abnormal cxray

## 2012-02-10 NOTE — Patient Instructions (Signed)
Get chest x fray in 2 weeks  If still abnormal we should do a chest ct . Will notify you  of labs when available. Weight loss as we discussed will help you feel better exercise may help. Consider med such as Wellbutrin for depressive sx . This may help you.

## 2012-02-11 ENCOUNTER — Encounter: Payer: Self-pay | Admitting: Internal Medicine

## 2012-02-11 DIAGNOSIS — E669 Obesity, unspecified: Secondary | ICD-10-CM | POA: Insufficient documentation

## 2012-02-11 DIAGNOSIS — Z01419 Encounter for gynecological examination (general) (routine) without abnormal findings: Secondary | ICD-10-CM | POA: Insufficient documentation

## 2012-02-11 NOTE — Assessment & Plan Note (Signed)
To get a stress test to r/o  cv causes

## 2012-02-11 NOTE — Assessment & Plan Note (Signed)
Get x ray in 2 weeks    May need Chest ct    Ct from  2009 for sob was negative

## 2012-02-11 NOTE — Assessment & Plan Note (Signed)
Coping but seems to be stuck . No other risk factors   Counseled. Increase exercise  Consider wellbutrin  May help the obesity issue also. But pt doesn't want to take meds .  At this point but will think about it,

## 2012-02-14 ENCOUNTER — Telehealth: Payer: Self-pay | Admitting: Internal Medicine

## 2012-02-14 NOTE — Telephone Encounter (Signed)
See result note  And send her copy

## 2012-02-14 NOTE — Telephone Encounter (Signed)
Pt would like to know results of labs & pap. Also would like a copy

## 2012-02-15 NOTE — Progress Notes (Signed)
Quick Note:  Pt aware of results. ______ 

## 2012-02-17 ENCOUNTER — Telehealth: Payer: Self-pay | Admitting: Cardiovascular Disease

## 2012-02-17 NOTE — Telephone Encounter (Signed)
Pt wants to perform a regular stress test instead of a stress nuclear study. We will go ahead and schedule her for an exercise stress test.

## 2012-02-17 NOTE — Telephone Encounter (Signed)
New problem:  Patient calling has a question, can she have an echo first instead of Nuclear stress test.

## 2012-02-17 NOTE — Telephone Encounter (Signed)
Would like to have a regular ETT instead of a nuclear stress test. Will forward to Dr Elease Hashimoto for reveiw

## 2012-02-20 ENCOUNTER — Telehealth: Payer: Self-pay | Admitting: *Deleted

## 2012-02-20 NOTE — Telephone Encounter (Signed)
Pt declined stress myoview per prior notes, requested exercise tolerance, Dr Elease Hashimoto agreed, order placed.

## 2012-02-20 NOTE — Telephone Encounter (Signed)
Schedule for 03/02/12 @ 8:30 with PA.  Patient is aware.

## 2012-02-21 ENCOUNTER — Telehealth: Payer: Self-pay | Admitting: *Deleted

## 2012-02-21 ENCOUNTER — Ambulatory Visit (HOSPITAL_COMMUNITY): Payer: Medicare Other

## 2012-02-21 NOTE — Telephone Encounter (Signed)
Jasmine December is taking care of the appt.

## 2012-02-21 NOTE — Telephone Encounter (Signed)
Will forward to Dr. Harvie Bridge nurse, Sable Feil, RN.

## 2012-03-02 ENCOUNTER — Ambulatory Visit (INDEPENDENT_AMBULATORY_CARE_PROVIDER_SITE_OTHER)
Admission: RE | Admit: 2012-03-02 | Discharge: 2012-03-02 | Disposition: A | Discharge status: Home / Self Care | Payer: Medicare Other | Source: Ambulatory Visit | Attending: Internal Medicine | Admitting: Internal Medicine

## 2012-03-02 ENCOUNTER — Ambulatory Visit (INDEPENDENT_AMBULATORY_CARE_PROVIDER_SITE_OTHER): Payer: Medicare Other | Admitting: Physician Assistant

## 2012-03-02 DIAGNOSIS — E669 Obesity, unspecified: Secondary | ICD-10-CM

## 2012-03-02 DIAGNOSIS — G479 Sleep disorder, unspecified: Secondary | ICD-10-CM

## 2012-03-02 DIAGNOSIS — R079 Chest pain, unspecified: Secondary | ICD-10-CM

## 2012-03-02 DIAGNOSIS — R918 Other nonspecific abnormal finding of lung field: Secondary | ICD-10-CM

## 2012-03-02 DIAGNOSIS — Z23 Encounter for immunization: Secondary | ICD-10-CM

## 2012-03-02 DIAGNOSIS — F329 Major depressive disorder, single episode, unspecified: Secondary | ICD-10-CM

## 2012-03-02 DIAGNOSIS — Z0000 Encounter for general adult medical examination without abnormal findings: Secondary | ICD-10-CM

## 2012-03-02 DIAGNOSIS — G478 Other sleep disorders: Secondary | ICD-10-CM

## 2012-03-02 DIAGNOSIS — R0989 Other specified symptoms and signs involving the circulatory and respiratory systems: Secondary | ICD-10-CM

## 2012-03-02 DIAGNOSIS — R9389 Abnormal findings on diagnostic imaging of other specified body structures: Secondary | ICD-10-CM

## 2012-03-02 DIAGNOSIS — M546 Pain in thoracic spine: Secondary | ICD-10-CM

## 2012-03-02 DIAGNOSIS — E785 Hyperlipidemia, unspecified: Secondary | ICD-10-CM

## 2012-03-02 DIAGNOSIS — Z01419 Encounter for gynecological examination (general) (routine) without abnormal findings: Secondary | ICD-10-CM

## 2012-03-02 NOTE — Progress Notes (Signed)
Exercise Treadmill Test  Pre-Exercise Testing Evaluation Rhythm: normal sinus  Rate: 77   PR:  .16 QRS:  .08  QT:  .38 QTc: .43     Test  Exercise Tolerance Test Ordering MD: Leodis Sias, MD  Interpreting MD:  Tereso Newcomer PA-C  Unique Test No: 1  Treadmill:  1  Indication for ETT: chest pain - rule out ischemia  Contraindication to ETT: No   Stress Modality: exercise - treadmill  Cardiac Imaging Performed: non   Protocol: standard Bruce - maximal  Max BP: 174/88  Max MPHR (bpm):  155 85% MPR (bpm):  131  MPHR obtained (bpm):  139 % MPHR obtained:  89%  Reached 85% MPHR (min:sec):  3:36 Total Exercise Time (min-sec):  5:15  Workload in METS:  7.0 Borg Scale: 17  Reason ETT Terminated:  patient's desire to stop    ST Segment Analysis At Rest: normal ST segments - no evidence of significant ST depression With Exercise: no evidence of significant ST depression  Other Information Arrhythmia:  No Angina during ETT:  absent (0) Quality of ETT:  diagnostic  ETT Interpretation:  normal - no evidence of ischemia by ST analysis  Comments: Fair exercise tolerance. No chest pain. Normal BP response to exercise. No ST-T changes to suggest ischemia.   Recommendations: Follow up with Dr. Delane Ginger as directed. Tereso Newcomer, PA-C  9:48 AM 03/02/2012

## 2012-03-08 NOTE — Progress Notes (Signed)
Quick Note:  Pt aware of results. Pt states that the last time she had a cxr the same thing happen, she got a CT Scan everything done was normal. She only had fatty liver. Pt doesn't want to get pulmonary referral. This was done 2-3 years ago. ______

## 2012-03-14 NOTE — Progress Notes (Signed)
Quick Note:  Pt aware of results. ______ 

## 2012-03-30 ENCOUNTER — Encounter: Payer: Self-pay | Admitting: Internal Medicine

## 2012-03-30 ENCOUNTER — Ambulatory Visit (INDEPENDENT_AMBULATORY_CARE_PROVIDER_SITE_OTHER): Payer: Medicare Other | Admitting: Internal Medicine

## 2012-03-30 VITALS — BP 130/78 | HR 82 | Temp 98.2°F | Wt 183.0 lb

## 2012-03-30 DIAGNOSIS — G478 Other sleep disorders: Secondary | ICD-10-CM

## 2012-03-30 DIAGNOSIS — R0989 Other specified symptoms and signs involving the circulatory and respiratory systems: Secondary | ICD-10-CM

## 2012-03-30 DIAGNOSIS — R0609 Other forms of dyspnea: Secondary | ICD-10-CM

## 2012-03-30 DIAGNOSIS — G479 Sleep disorder, unspecified: Secondary | ICD-10-CM

## 2012-03-30 DIAGNOSIS — R9389 Abnormal findings on diagnostic imaging of other specified body structures: Secondary | ICD-10-CM

## 2012-03-30 DIAGNOSIS — R918 Other nonspecific abnormal finding of lung field: Secondary | ICD-10-CM

## 2012-03-30 DIAGNOSIS — F329 Major depressive disorder, single episode, unspecified: Secondary | ICD-10-CM

## 2012-03-30 MED ORDER — ZOLPIDEM TARTRATE ER 6.25 MG PO TBCR: 6.2500 mg | EXTENDED_RELEASE_TABLET | Freq: Every evening | ORAL | Status: DC | PRN

## 2012-03-30 NOTE — Patient Instructions (Addendum)
Continue  Increasing walking and weight loss .  rov in 6 months .  Will check  Into the x ray reading  As we discussed

## 2012-03-31 ENCOUNTER — Encounter: Payer: Self-pay | Admitting: Internal Medicine

## 2012-03-31 NOTE — Progress Notes (Signed)
  Subjective:    Patient ID: Samantha [name], female    DOB: December 13, 1946, 66 y.o.   MRN: [id number]  HPI Patient comes in today for follow up of  multiple medical problems.  Since last visit she is still taking the ambien to help sleep. She doesn't really feel stuck at this point has a hard time when returns after travel to the place of her daughters murder( her own home)   But she is not suicidal is walking now  And going out ot activitiies.  DOE and thorax pain : has had neg stress test.   Chest x ray : brought copies of old cts nl after an cxray showed a shadow in the past.    Review of Systems NO fever syncope bleeding new sx. Knows needs to lose weight. Past history family history social history reviewed in the electronic medical record.     Objective:   Physical Exam BP 130/78  Pulse 82  Temp(Src) 98.2 F (36.8 C) (Oral)  Wt 183 lb (83.008 kg)  SpO2 96% WDWN innad Looks well today  Oriented x 3. Normal cognition, attention, speech. Not anxious or depressed appearing   Good eye contact . reveiwed data labs from last visit and x ray report  Lab Results  Component Value Date   WBC 10.7* 02/10/2012   HGB 12.9 02/10/2012   HCT 38.6 02/10/2012   PLT 230.0 02/10/2012   GLUCOSE 97 02/10/2012   CHOL 220* 02/10/2012   TRIG 167.0* 02/10/2012   HDL 56.70 02/10/2012   LDLDIRECT 140.9 02/10/2012   ALT 14 02/10/2012   AST 19 02/10/2012   NA 140 02/10/2012   K 4.0 02/10/2012   CL 106 02/10/2012   CREATININE 0.6 02/10/2012   BUN 8 02/10/2012   CO2 26 02/10/2012   TSH 2.15 02/10/2012   Clinical Data: Follow up pneumonia. No complaints.  CHEST - 2 VIEW  Comparison: 01/31/2012 and CT from 05/30/2008  Findings: Two views of the chest were obtained. Stable appearance  of the heart and mediastinum. Again noted are densities in the  medial right lower lobe that probably represent a combination of  vascular structures and bone. No evidence for airspace disease.  Upper lungs are clear. Trachea is  midline. Bony structures are  intact.  IMPRESSION:  No acute chest findings.  Original Report Authenticated By: Richarda Overlie, M.D.  Wt Readings from Last 3 Encounters:  03/30/12 183 lb (83.008 kg)  02/10/12 186 lb (84.369 kg)  02/09/12 187 lb 9.6 oz (85.095 kg)        Assessment & Plan:   MOOD reactive depression and bereavement  Better picture   No other alarm features  dsic support  Sleep  Risk benefit of medication discussed. Will refill meds   "abnormal cxray" may not be abnormal after all.  Disc options Will plan to get radiology to review at some point.   Probably does not need FU/  DOE  Poss deconditioning pt doesn't think needs further evaluation atthis time  Will folllow. Obesity  Agree weight loss will help.   Total visit > 50% spent counseling and coordinating care

## 2012-03-31 NOTE — Assessment & Plan Note (Signed)
Coping and doing better today  Remains active in activities .

## 2012-03-31 NOTE — Assessment & Plan Note (Signed)
Risk benefit of medication discussed. Wants to continue on meds as such

## 2012-03-31 NOTE — Assessment & Plan Note (Signed)
Continue exercise disc consideration of PFTs but pt wants to continue on conditioning intervention

## 2012-03-31 NOTE — Assessment & Plan Note (Signed)
Fu c xray seems to be ok but comment on abnormal shadows  Had normal CT scan in the past .

## 2012-04-02 ENCOUNTER — Ambulatory Visit: Payer: Medicare Other | Admitting: Internal Medicine

## 2012-04-02 ENCOUNTER — Telehealth: Payer: Self-pay | Admitting: Family Medicine

## 2012-04-02 NOTE — Telephone Encounter (Signed)
Wants to remind Dr. Fabian Sharp to check with the radiologist about the shadow on her lung xray. Also, pt's father died of mesothelioma years ago. They lived in the [place] factory [place]. Several people from that town have been diagnosed. Is this a possibility for [facility]? Please look in to this, check with radiologist, and call pt back.

## 2012-04-04 NOTE — Telephone Encounter (Signed)
Left a message for return call.  

## 2012-04-04 NOTE — Telephone Encounter (Signed)
Tell patient  i discussed with radiologist and  He dint see any finding of significance or other abnormality .     No, this is  Not cw mesothelioma .   I would do nothing further unless  She has some symptoms   We could also  just  repeat an x ray in 6-12 months  To ensure   No changes .

## 2012-04-05 NOTE — Telephone Encounter (Signed)
Left a message for return call.  

## 2012-04-09 NOTE — Telephone Encounter (Signed)
Patient is calling returning Samantha [name]'s call and states it's okay to leave a message

## 2012-04-09 NOTE — Telephone Encounter (Signed)
Pt aware.

## 2012-10-10 ENCOUNTER — Ambulatory Visit (INDEPENDENT_AMBULATORY_CARE_PROVIDER_SITE_OTHER): Payer: Medicare Other | Admitting: Family Medicine

## 2012-10-10 ENCOUNTER — Encounter: Payer: Self-pay | Admitting: Family Medicine

## 2012-10-10 VITALS — BP 130/80 | Temp 98.0°F | Wt 161.0 lb

## 2012-10-10 DIAGNOSIS — G8929 Other chronic pain: Secondary | ICD-10-CM

## 2012-10-10 DIAGNOSIS — R1084 Generalized abdominal pain: Secondary | ICD-10-CM

## 2012-10-10 NOTE — Progress Notes (Signed)
  Subjective:    Patient ID: Samantha [name], female    DOB: December 26, 1946, 66 y.o.   MRN: [id number]  HPI Samantha [name] is a 66 year old single female nonsmoker who comes in with a three-week history of abdominal discomfort and bloating  She states she felt well until about 3 weeks ago when she began having episodes of abdominal gas and bloating. She's had no fever nausea vomiting diarrhea or change in urinary habits. Since April she's been on a diet and is voluntarily lost 30 pounds. She's had her gallbladder removed and colon polyps removed by Dr. Loreta Ave. She's had no history of any GYN problems.   Review of Systems General and gastro-review of systems otherwise negative    Objective:   Physical Exam Well-developed well-nourished female in no acute distress examination of the abdomen was normal although she states she feels bloated. There were well-healed stab wounds from the previous laparoscopic cholecystectomy. Bowel sounds are normal I can palpate no masses or tenderness.       Assessment & Plan:  Abdominal bloating x3 weeks plan refer to gastroenterologist for diagnostic workup

## 2012-10-10 NOTE — Patient Instructions (Signed)
[facility] gastroenterologist now to get an appointment ASAP for a diagnostic workup to determine the cause of your symptoms

## 2012-10-16 ENCOUNTER — Other Ambulatory Visit: Payer: Self-pay | Admitting: Gastroenterology

## 2012-10-16 DIAGNOSIS — G8929 Other chronic pain: Secondary | ICD-10-CM

## 2012-10-22 ENCOUNTER — Ambulatory Visit
Admission: RE | Admit: 2012-10-22 | Discharge: 2012-10-22 | Disposition: A | Discharge status: Home / Self Care | Payer: Medicare Other | Source: Ambulatory Visit | Attending: Gastroenterology | Admitting: Gastroenterology

## 2012-10-22 DIAGNOSIS — R1032 Left lower quadrant pain: Secondary | ICD-10-CM

## 2012-10-22 DIAGNOSIS — G8929 Other chronic pain: Secondary | ICD-10-CM

## 2013-01-07 ENCOUNTER — Ambulatory Visit (INDEPENDENT_AMBULATORY_CARE_PROVIDER_SITE_OTHER): Payer: Medicare Other | Admitting: Family Medicine

## 2013-01-07 DIAGNOSIS — Z23 Encounter for immunization: Secondary | ICD-10-CM

## 2013-03-12 ENCOUNTER — Ambulatory Visit (INDEPENDENT_AMBULATORY_CARE_PROVIDER_SITE_OTHER): Payer: Medicare Other | Admitting: Internal Medicine

## 2013-03-12 ENCOUNTER — Encounter: Payer: Self-pay | Admitting: Internal Medicine

## 2013-03-12 VITALS — BP 114/78 | HR 70 | Temp 97.7°F | Wt 173.0 lb

## 2013-03-12 DIAGNOSIS — Z7289 Other problems related to lifestyle: Secondary | ICD-10-CM

## 2013-03-12 DIAGNOSIS — G479 Sleep disorder, unspecified: Secondary | ICD-10-CM

## 2013-03-12 DIAGNOSIS — E785 Hyperlipidemia, unspecified: Secondary | ICD-10-CM

## 2013-03-12 DIAGNOSIS — R141 Gas pain: Secondary | ICD-10-CM

## 2013-03-12 DIAGNOSIS — R140 Abdominal distension (gaseous): Secondary | ICD-10-CM | POA: Insufficient documentation

## 2013-03-12 DIAGNOSIS — F1090 Alcohol use, unspecified, uncomplicated: Secondary | ICD-10-CM

## 2013-03-12 DIAGNOSIS — F1010 Alcohol abuse, uncomplicated: Secondary | ICD-10-CM

## 2013-03-12 DIAGNOSIS — R42 Dizziness and giddiness: Secondary | ICD-10-CM

## 2013-03-12 LAB — BASIC METABOLIC PANEL
BUN: 12 mg/dL (ref 6–23)
CO2: 27 mEq/L (ref 19–32)
Chloride: 104 mEq/L (ref 96–112)
Creatinine, Ser: 0.6 mg/dL (ref 0.4–1.2)
Glucose, Bld: 86 mg/dL (ref 70–99)

## 2013-03-12 LAB — CBC WITH DIFFERENTIAL/PLATELET
Basophils Absolute: 0 10*3/uL (ref 0.0–0.1)
Eosinophils Absolute: 0.1 10*3/uL (ref 0.0–0.7)
Lymphs Abs: 2 10*3/uL (ref 0.7–4.0)
MCHC: 34.2 g/dL (ref 30.0–36.0)
MCV: 95.2 fl (ref 78.0–100.0)
Monocytes Absolute: 0.3 10*3/uL (ref 0.1–1.0)
Neutrophils Relative %: 65.4 % (ref 43.0–77.0)
Platelets: 244 10*3/uL (ref 150.0–400.0)
RDW: 12.6 % (ref 11.5–14.6)
WBC: 7.3 10*3/uL (ref 4.5–10.5)

## 2013-03-12 LAB — HEPATIC FUNCTION PANEL
Bilirubin, Direct: 0.2 mg/dL (ref 0.0–0.3)
Total Bilirubin: 1.4 mg/dL — ABNORMAL HIGH (ref 0.3–1.2)

## 2013-03-12 LAB — LIPID PANEL
Cholesterol: 220 mg/dL — ABNORMAL HIGH (ref 0–200)
HDL: 69.4 mg/dL (ref >39.00)
Total CHOL/HDL Ratio: 3
Triglycerides: 168 mg/dL — ABNORMAL HIGH (ref 0.0–149.0)

## 2013-03-12 LAB — TSH: TSH: 1.12 u[IU]/mL (ref 0.35–5.50)

## 2013-03-12 NOTE — Progress Notes (Signed)
Chief Complaint  Patient presents with  . Stomach pain    Started back in October.  Seen Dr. Tawanna Cooler for this who referred her.  Seen Dr. Loreta Ave  . Dizziness    HPI: Patient comes in today for SDA for  problem evaluation. Seen dr todd for abd sx in the fall  Was referred to GI doctor .   Bloating  An issue .  Saw Dr Loreta Ave and to do colonoscopy.  In April. After weight loss  Getting  some better and after exercise  ? If dizziness  If dehydrated. Usually when she moves her head a certain way. But no specific vertigo neurologic signs her mother had a  vascular event at 31 she worries about this. Wonders with the bloating as it's mostly upper [place] sometimes goes through to her back.  ? Blood test at some point.   ? No abd imaging  [place] etc  ? ? Imbalance vs light headed.  Taking ambien for a while 6 years and cuts them in half. ! Always been cautioned that you would not do this with a long-acting medicine.  She's also been having 3 alcoholic beverages a night..  No other medications.  Last ov with me   4 13   ROS: See pertinent positives and negatives per HPI. Denies any head trauma suicidality vision changes focal weakness or numbness. No blood in stool vomiting .    Past Medical History  Diagnosis Date  . Colonic polyp     Family History  Problem Relation Age of Onset  . Heart attack Mother     58  . Hypertension Mother   . Cancer Sister     colon - 55  . Other Daughter     murdered    History   Social History  . Marital Status: Divorced    Spouse Name: N/A    Number of Children: N/A  . Years of Education: N/A   Social History Main Topics  . Smoking status: Never Smoker   . Smokeless tobacco: None  . Alcohol Use: No  . Drug Use: No  . Sexually Active:    Other Topics Concern  . None   Social History Narrative   Occupation: Retired Equities trader   Divorced   Regular exercise -  Only ocass now   Hh of 1    etoh 1-2 per week   Daughter murdered in her house     Outpatient Encounter Prescriptions as of 03/12/2013  Medication Sig Dispense Refill  . zolpidem (AMBIEN CR) 6.25 MG CR tablet Take 1 tablet (6.25 mg total) by mouth at bedtime as needed.  90 tablet  1   No facility-administered encounter medications on file as of 03/12/2013.    EXAM:  BP 114/78  Pulse 70  Temp(Src) 97.7 F (36.5 C) (Oral)  Wt 173 lb (78.472 kg)  BMI 32.41 kg/m2  SpO2 98%  Body mass index is 32.41 kg/(m^2). Wt Readings from Last 3 Encounters:  03/12/13 173 lb (78.472 kg)  10/10/12 161 lb (73.029 kg)  03/30/12 183 lb (83.008 kg)    GENERAL: vitals reviewed and listed above, alert, oriented, appears well hydrated and in no acute distress  HEENT: atraumatic, conjunctiva  clear, no obvious abnormalities on inspection of external nose and ears OP : no lesion edema or exudate  EOMs are full she feels a bit dizzy when she lays down flat slight end gaze nystagmus at that point.  NECK: no obvious masses on inspection  palpation no bruits or masses  LUNGS: clear to auscultation bilaterally, no wheezes, rales or rhonchi, good air movement  CV: HRRR, no clubbing cyanosis or  peripheral edema nl cap refill  Abdomen soft without hepatomegaly guarding or rebound. Liver at the right costal margin MS: moves all extremities without noticeable focal  Abnormality   PSYCH: pleasant and cooperative, no obvious depression or anxiety.  NEURO: oriented x 3 CN 3-12 appear intact. No focal muscle weakness or atrophy. DTRs symmetrical. Gait WNL.  Grossly non focal. No tremor or abnormal movement.  ASSESSMENT AND PLAN:  Discussed the following assessment and plan:  Dizziness and giddiness - Plan: Basic metabolic panel, CBC with Differential, Hepatic function panel, Lipid panel, TSH, Hemoglobin A1c, Celiac panel 10  SLEEP DISORDER - Has now been on Ambien for years discussed risk. - Plan: Basic metabolic panel, CBC with Differential, Hepatic function panel, Lipid panel, TSH,  Hemoglobin A1c, Celiac panel 10  HYPERLIPIDEMIA - Plan: Basic metabolic panel, CBC with Differential, Hepatic function panel, Lipid panel, TSH, Hemoglobin A1c, Celiac panel 10  Bloating symptom - Under evaluation consider trying probiotics await evaluation ? if abd US done at some point. - Plan: Basic metabolic panel, CBC with Differential, Hepatic function panel, Lipid panel, TSH, Hemoglobin A1c, Celiac panel 10  Habitual alcohol use - No history of problems however with the Ambien and this is high risk discussed that this could cause dizziness and problems. Including sleep As above she could have some positional vertigo but discussed risk benefit of Ambien and did not take with alcohol. Consider other transitional meds in the future. -Patient advised to return or notify health care team  if symptoms worsen or persist or new concerns arise.  Patient Instructions  The dizziness sounds a little bit like positional vertigo but we will check for diabetes anemia other metabolic thinks it could add to dizziness and bloating.  I suggest you try a probiotic such as ALIGN once a day for a few weeks and see if it helps your bloating. But proceed with the colonoscopy.   Alcohol and Ambien can be dangerous can cause depression amnesia and next-day dizziness  and decreased reaction time also. Do not take alcohol and Ambien in the same evening .   It is good that you're trying to lose weight in a healthy manner.    Neta Mends. Magdaline Zollars M.D.

## 2013-03-12 NOTE — Patient Instructions (Addendum)
The dizziness sounds a little bit like positional vertigo but we will check for diabetes anemia other metabolic thinks it could add to dizziness and bloating.  I suggest you try a probiotic such as ALIGN once a day for a few weeks and see if it helps your bloating. But proceed with the colonoscopy.   Alcohol and Ambien can be dangerous can cause depression amnesia and next-day dizziness  and decreased reaction time also. Do not take alcohol and Ambien in the same evening .   It is good that you're trying to lose weight in a healthy manner.

## 2013-03-13 LAB — CELIAC PANEL 10
Gliadin IgA: 21.2 U/mL — ABNORMAL HIGH (ref <20)
Gliadin IgG: 144 U/mL — ABNORMAL HIGH (ref <20)
Tissue Transglut Ab: 10.4 U/mL (ref <20)

## 2013-03-19 ENCOUNTER — Telehealth: Payer: Self-pay | Admitting: Internal Medicine

## 2013-03-19 NOTE — Telephone Encounter (Signed)
Patient calling back with questions about lab work. Wants to know what triglyceride level, celiac abnormalities were. Questions answered.

## 2013-03-20 NOTE — Telephone Encounter (Signed)
FYI

## 2013-03-28 ENCOUNTER — Telehealth: Payer: Self-pay | Admitting: Internal Medicine

## 2013-03-28 NOTE — Telephone Encounter (Signed)
Sent to the patient by mail.

## 2013-03-28 NOTE — Telephone Encounter (Signed)
Patient Information:  Caller Name: Noheli  Phone: [phone]  Patient: Aquita, Simmering  Gender: Female  DOB: September 01, 1946  Age: 67 Years  PCP: Berniece Andreas [facility])  Office Follow Up:  Does the office need to follow up with this patient?: Yes  Instructions For The Office: Patient requests copies of all lab results from 03/12/13 to be mailed to her home address. Verified home address with patient per EMR.   Thank you.  RN Note:  Labs done   03/12/13 Result Notes    Notes Recorded by Nils Flack, CMA on 03/19/2013 at 10:47 AM Left message on personally identified home number informing the patient of all.  Instructed to call back if she had any questions. ------  Notes Recorded by Madelin Headings, MD on 03/14/2013 at 9:12 PM Inform pt that labs  Are normal except modestly elevated cholesterol  . No diabetes.  The celia panel had some mild abnormalities but not diagnostic of celiac .    Will send copy of labs to dr Loreta Ave also .   Keep follow up appt  Symptoms  Reason For Call & Symptoms: Caller requests to have labs mailed to her; states she has not received them as of 03/28/13.   No evidence that this has been mailed to her.  Reviewed Health History In EMR: Yes  Reviewed Medications In EMR: Yes  Reviewed Allergies In EMR: Yes  Reviewed Surgeries / Procedures: Yes  Date of Onset of Symptoms: Unknown  Guideline(s) Used:  No Protocol Available - Information Only  Disposition Per Guideline:   Discuss with PCP and Callback by Nurse Today  Reason For Disposition Reached:   Nursing judgment  Advice Given:  Call Back If:  New symptoms develop  You become worse.  Patient Will Follow Care Advice:  YES

## 2013-05-27 IMAGING — US US PELVIS COMPLETE
1 series · 13 of 25 positions shown · non-contrast
Comparison: None

CLINICAL DATA: Chronic bilateral lower abdominal pain

TRANSABDOMINAL AND TRANSVAGINAL ULTRASOUND OF PELVIS
TECHNIQUE: Both transabdominal and transvaginal ultrasound
examinations of the pelvis were performed. Transabdominal technique
was performed for global imaging of the pelvis including uterus,
ovaries, adnexal regions, and pelvic cul-de-sac.
It was necessary to proceed with endovaginal exam following the
transabdominal exam to visualize the the ovaries and adnexa and
endometrium to better extent.

[Series 1: us pelvis complete · 0.35mm/px · 13 of 44 slices shown]
[im 1/44]
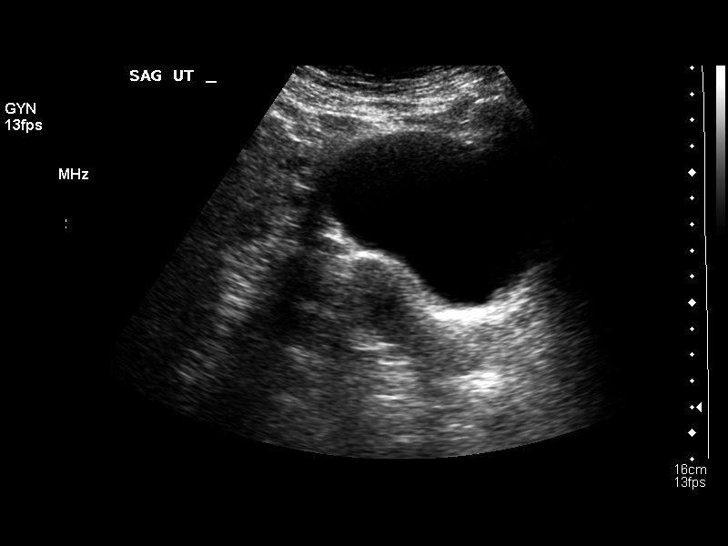
[im 4/44]
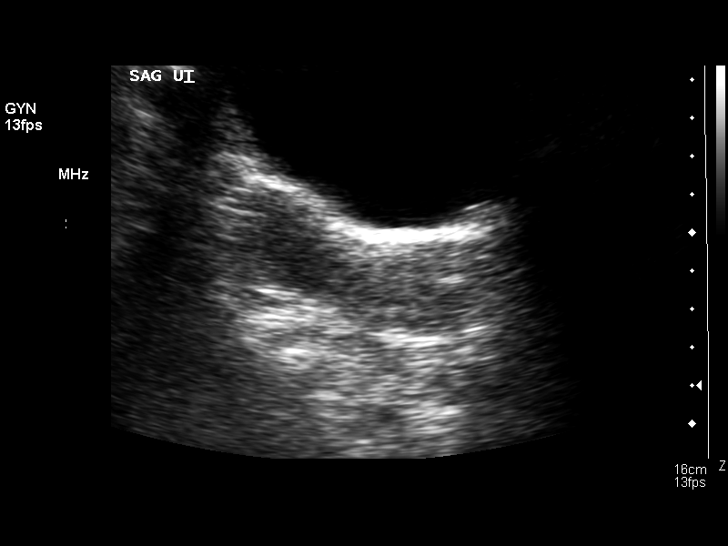
[im 8/44]
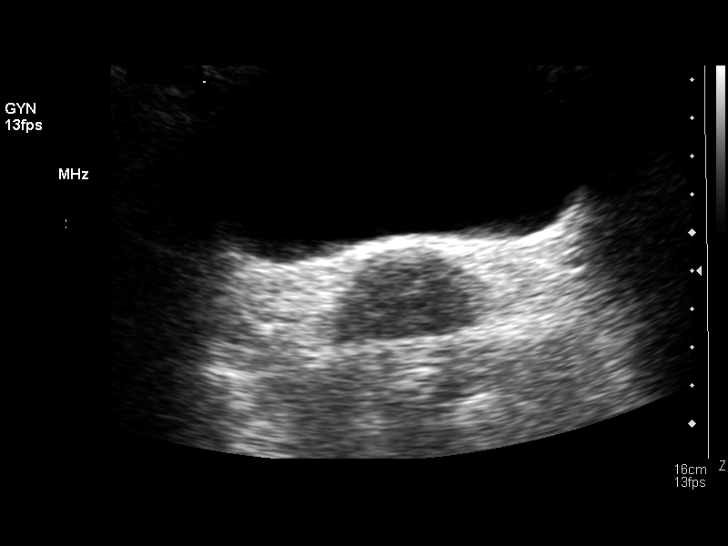
[im 11/44]
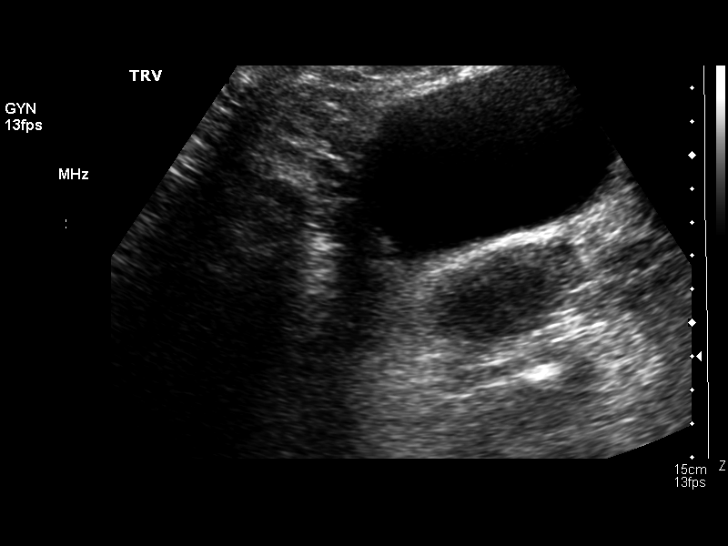
[im 15/44]
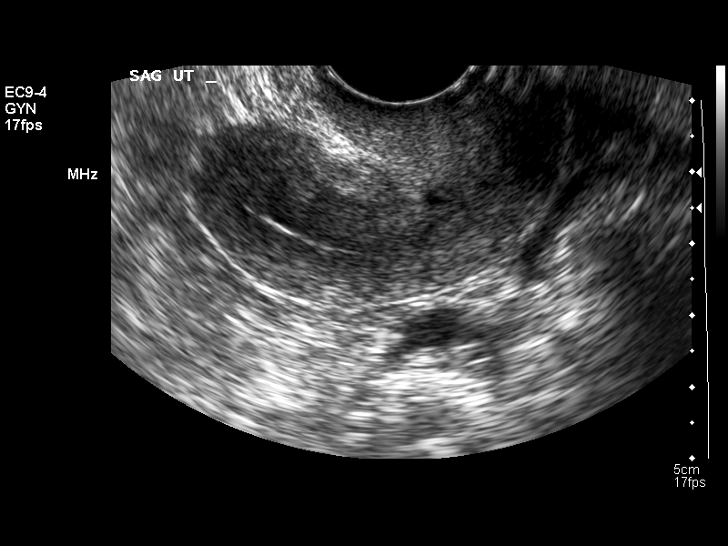
[im 18/44]
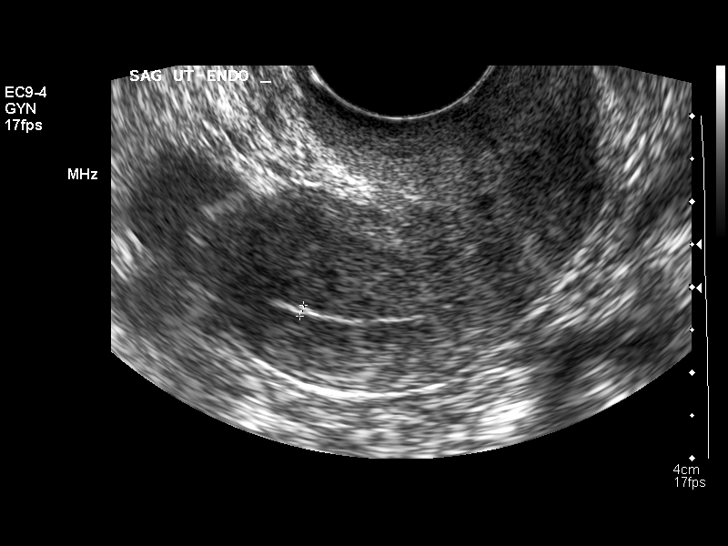
[im 22/44]
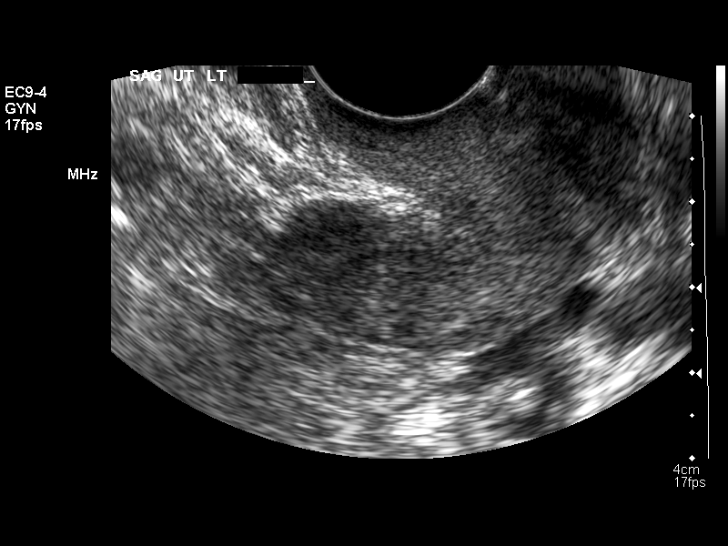
[im 26/44]
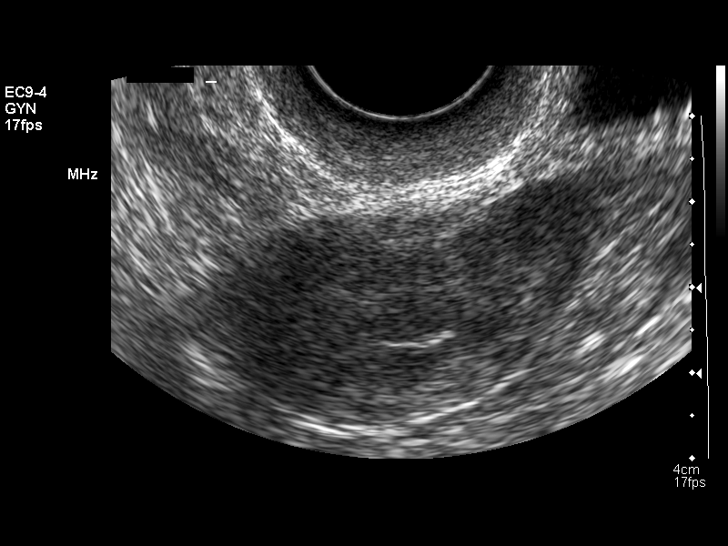
[im 29/44]
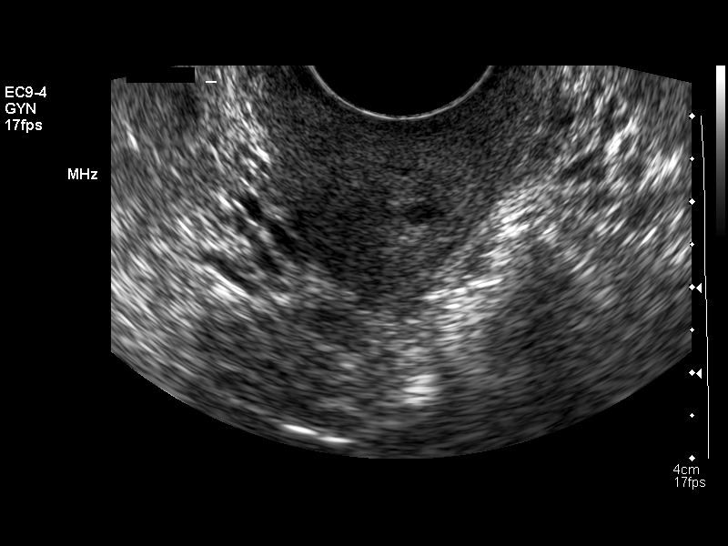
[im 33/44]
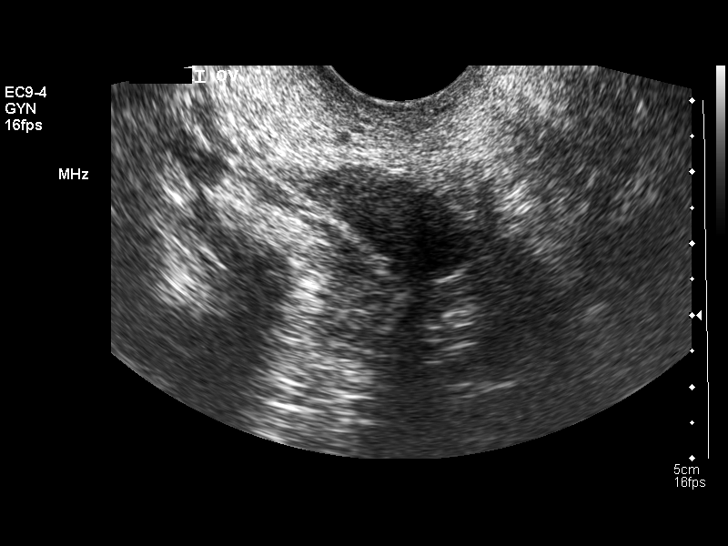
[im 36/44]
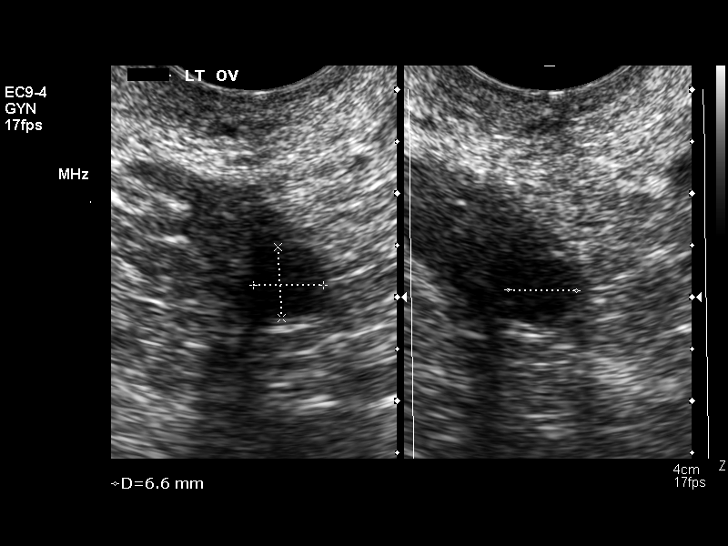
[im 40/44]
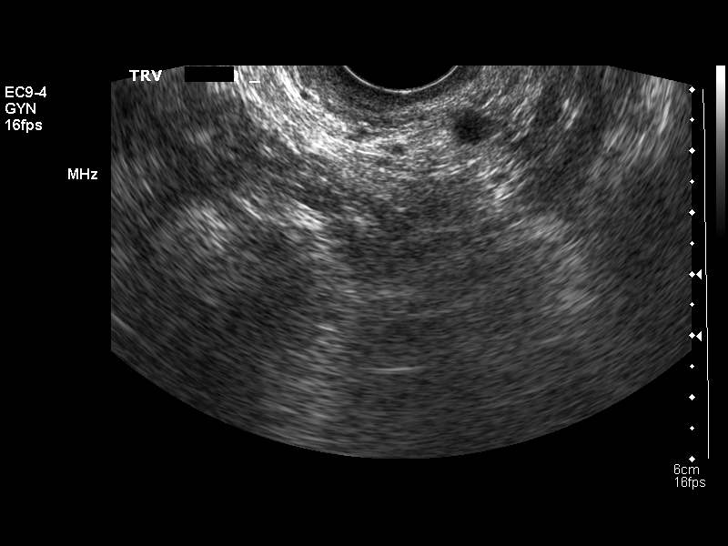
[im 44/44]
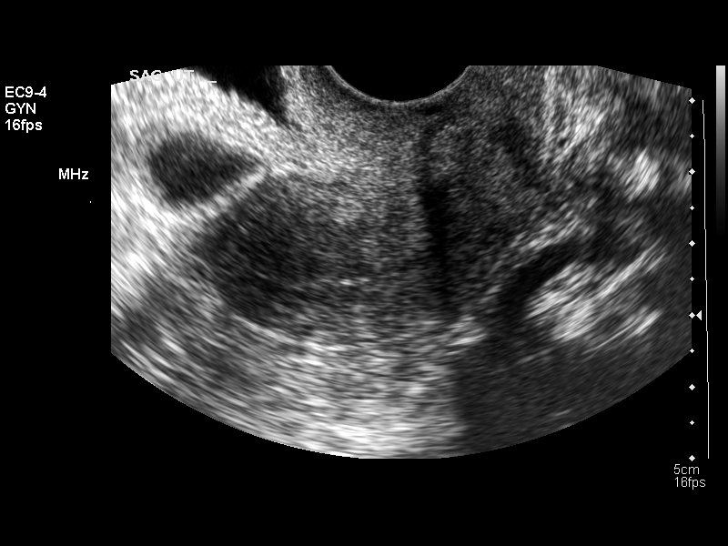

[13 of 25 positions shown; findings below may reference images not displayed]

FINDINGS: Uterus: The uterus is normal in size and echogenicity measuring
cm x 2.2 cm x 3.4 cm.  It is neutral position.  There is any 1.3 cm
x 1.2 cm x 1.4 cm nearly isoechoic subserosal fibroid along the
anterior upper uterine segment.

Endometrium: Normal in thickness and smooth measuring 1.3 mm.

Right ovary:  The right ovary was not visualized.  No adnexal
masses.

Left ovary: Left ovary seen on endovaginal imaging only.  It is
normal in overall size and echogenicity with normal blood flow by
color Doppler analysis.  It measures 23 mm x 11 mm x 24 mm.  No
adnexal masses.

Other findings: No free fluid
IMPRESSION: 1.4 cm uterine fibroid.  Uterus otherwise unremarkable.

Right ovary  was not visualized, but no right adnexal mass or
pelvic free fluid is seen.  Normal left ovary.

No findings to explain bilateral lower abdominal pain/pelvic pain.
No enlarged ovarian cysts are seen..

## 2013-10-14 ENCOUNTER — Ambulatory Visit (INDEPENDENT_AMBULATORY_CARE_PROVIDER_SITE_OTHER): Payer: Medicare Other | Admitting: Family Medicine

## 2013-10-14 DIAGNOSIS — Z23 Encounter for immunization: Secondary | ICD-10-CM

## 2013-10-15 ENCOUNTER — Ambulatory Visit: Payer: Medicare Other

## 2014-01-28 ENCOUNTER — Encounter: Payer: Medicare Other | Admitting: Internal Medicine

## 2014-03-12 ENCOUNTER — Other Ambulatory Visit: Payer: Medicare Other

## 2014-03-13 ENCOUNTER — Other Ambulatory Visit (INDEPENDENT_AMBULATORY_CARE_PROVIDER_SITE_OTHER): Payer: Medicare Other

## 2014-03-13 DIAGNOSIS — E785 Hyperlipidemia, unspecified: Secondary | ICD-10-CM

## 2014-03-13 DIAGNOSIS — Z0000 Encounter for general adult medical examination without abnormal findings: Secondary | ICD-10-CM

## 2014-03-13 LAB — HEPATIC FUNCTION PANEL
ALK PHOS: 73 U/L (ref 39–117)
ALT: 18 U/L (ref 0–35)
AST: 21 U/L (ref 0–37)
Albumin: 3.9 g/dL (ref 3.5–5.2)
BILIRUBIN DIRECT: 0.1 mg/dL (ref 0.0–0.3)
Total Bilirubin: 1.2 mg/dL (ref 0.3–1.2)
Total Protein: 7.1 g/dL (ref 6.0–8.3)

## 2014-03-13 LAB — BASIC METABOLIC PANEL
BUN: 9 mg/dL (ref 6–23)
CHLORIDE: 109 meq/L (ref 96–112)
CO2: 27 meq/L (ref 19–32)
CREATININE: 0.6 mg/dL (ref 0.4–1.2)
Calcium: 8.6 mg/dL (ref 8.4–10.5)
GFR: 110.08 mL/min (ref >60.00)
GLUCOSE: 92 mg/dL (ref 70–99)
Potassium: 3.8 mEq/L (ref 3.5–5.1)
Sodium: 142 mEq/L (ref 135–145)

## 2014-03-13 LAB — CBC WITH DIFFERENTIAL/PLATELET
BASOS PCT: 0.4 % (ref 0.0–3.0)
Basophils Absolute: 0 10*3/uL (ref 0.0–0.1)
EOS ABS: 0.1 10*3/uL (ref 0.0–0.7)
Eosinophils Relative: 1.5 % (ref 0.0–5.0)
HCT: 39 % (ref 36.0–46.0)
Hemoglobin: 13.1 g/dL (ref 12.0–15.0)
Lymphocytes Relative: 24.7 % (ref 12.0–46.0)
Lymphs Abs: 2.3 10*3/uL (ref 0.7–4.0)
MCHC: 33.7 g/dL (ref 30.0–36.0)
MCV: 94.8 fl (ref 78.0–100.0)
MONO ABS: 0.5 10*3/uL (ref 0.1–1.0)
Monocytes Relative: 5.4 % (ref 3.0–12.0)
NEUTROS ABS: 6.5 10*3/uL (ref 1.4–7.7)
NEUTROS PCT: 68 % (ref 43.0–77.0)
Platelets: 281 10*3/uL (ref 150.0–400.0)
RBC: 4.11 Mil/uL (ref 3.87–5.11)
RDW: 13.2 % (ref 11.5–14.6)
WBC: 9.5 10*3/uL (ref 4.5–10.5)

## 2014-03-13 LAB — LIPID PANEL
CHOL/HDL RATIO: 3
Cholesterol: 205 mg/dL — ABNORMAL HIGH (ref 0–200)
HDL: 65 mg/dL (ref >39.00)
LDL Cholesterol: 120 mg/dL — ABNORMAL HIGH (ref 0–99)
Triglycerides: 101 mg/dL (ref 0.0–149.0)
VLDL: 20.2 mg/dL (ref 0.0–40.0)

## 2014-03-13 LAB — TSH: TSH: 1.06 u[IU]/mL (ref 0.35–5.50)

## 2014-03-17 ENCOUNTER — Ambulatory Visit (INDEPENDENT_AMBULATORY_CARE_PROVIDER_SITE_OTHER): Payer: Medicare Other | Admitting: Internal Medicine

## 2014-03-17 ENCOUNTER — Encounter: Payer: Self-pay | Admitting: Internal Medicine

## 2014-03-17 VITALS — BP 130/84 | HR 83 | Temp 98.3°F | Ht 61.0 in | Wt 190.0 lb

## 2014-03-17 DIAGNOSIS — Z23 Encounter for immunization: Secondary | ICD-10-CM

## 2014-03-17 DIAGNOSIS — Z0000 Encounter for general adult medical examination without abnormal findings: Secondary | ICD-10-CM | POA: Insufficient documentation

## 2014-03-17 DIAGNOSIS — E785 Hyperlipidemia, unspecified: Secondary | ICD-10-CM

## 2014-03-17 DIAGNOSIS — E2839 Other primary ovarian failure: Secondary | ICD-10-CM

## 2014-03-17 NOTE — Patient Instructions (Signed)
Continue lifestyle intervention healthy eating and exercise . 150 minutes of exercise weeks  ,  Lose weight  To healthy levels. Avoid trans fats and processed foods;  Increase fresh fruits and veges to 5 servings per day. And avoid sweet beverages  Including tea and juice.  Get dexa scan  Will get your results . Otherwise  Preventive visit in a year .

## 2014-03-17 NOTE — Progress Notes (Signed)
Chief Complaint  Patient presents with  . Medicare Wellness    HPI: Patient comes in today for Preventive Medicare wellness visit . No major injuries, ed visits ,hospitalizations , new medications since last visit. She just came back from [place] and began needing healthier does her brother is eating healthier. His carbohydrates and sweets. Walking is exercise. Generally feels well but no she is to lose weight. Last bone density was 8 or 9 years ago. Is up-to-date on her colonoscopy family history of colon cancer Sr. died of it at age 66. Due for a mammogram.  Health Maintenance  Topic Date Due  . Mammogram  01/23/2014  . Influenza Vaccine  07/26/2014  . Tetanus/tdap  12/26/2016  . Colonoscopy  04/02/2023  . Pneumococcal Polysaccharide Vaccine Age 13 And Over  Completed  . Zostavax  Completed   Health Maintenance Review  Hearing: ok   Vision:  No limitations at present . Last eye check UTD glasses  [place]   Safety:  Has smoke detector and wears seat belts.  No firearms. No excess sun exposure. Sees dentist regularly.  Falls:  No   Advance directive :  Reviewed  Has one.  Memory: Felt to be good  , no concern from her or her family.   Depression: No anhedonia unusual crying or depressive symptoms  Nutrition: Eats well balanced diet; adequate calcium and vitamin D. No swallowing chewing problems.  Injury: no major injuries in the last six months.  Other healthcare providers:  Reviewed today .  Social:   h h of 1  No pets.   Preventive parameters: up-to-date  Reviewed   ADLS:   There are no problems or need for assistance  driving, feeding, obtaining food, dressing, toileting and bathing, managing money using phone. She is independent.  EXERCISE/ HABITS  Per week   Back to [facility]  No tobacco.    etoh wine at night. Sometimes 2/3+ bottle.   ROS: not taking sleep medicine now  GEN/ HEENT: No fever, significant weight changes sweats headaches vision problems hearing  changes, CV/ PULM; No chest pain shortness of breath cough, syncope,edema  change in exercise tolerance. GI /GU: No adominal pain, vomiting, change in bowel habits. No blood in the stool. No significant GU symptoms. SKIN/HEME: ,no acute skin rashes suspicious lesions or bleeding. No lymphadenopathy, nodules, masses.  NEURO/ PSYCH:  No neurologic signs such as weakness numbness. No depression anxiety. IMM/ Allergy: No unusual infections.  Allergy .   REST of 12 system review negative except as per HPI   Past Medical History  Diagnosis Date  . Colonic polyp     Family History  Problem Relation Age of Onset  . Heart attack Mother     65  . Hypertension Mother   . Cancer Sister     colon - 96  . Other Daughter     murdered  .      Brother has fatty liver no drinking History   Social History  . Marital Status: Divorced    Spouse Name: N/A    Number of Children: N/A  . Years of Education: N/A   Social History Main Topics  . Smoking status: Never Smoker   . Smokeless tobacco: None  . Alcohol Use: No  . Drug Use: No  . Sexual Activity:    Other Topics Concern  . None   Social History Narrative   Occupation: Retired Occupational hygienist   Divorced   Regular exercise -  Only ocass  now   Hh of 1    etoh 1-2 per week   Daughter murdered in her house    Outpatient Encounter Prescriptions as of 03/17/2014  Medication Sig  . [DISCONTINUED] zolpidem (AMBIEN CR) 6.25 MG CR tablet Take 1 tablet (6.25 mg total) by mouth at bedtime as needed.    EXAM:  BP 130/84  Pulse 83  Temp(Src) 98.3 F (36.8 C) (Oral)  Ht 5\' 1"  (1.549 m)  Wt 190 lb (86.183 kg)  BMI 35.92 kg/m2  SpO2 98%  Body mass index is 35.92 kg/(m^2).  Physical Exam: Vital signs reviewed IRC:VELF is a well-developed well-nourished alert cooperative   who appears stated age in no acute distress.  HEENT: normocephalic atraumatic , Eyes: PERRL EOM's full, conjunctiva clear, Nares: paten,t no deformity discharge or  tenderness., Ears: no deformity EAC's clear TMs with normal landmarks. Mouth: clear OP, no lesions, edema.  Moist mucous membranes. Dentition in adequate repair. NECK: supple without masses, thyromegaly or bruits. CHEST/PULM:  Clear to auscultation and percussion breath sounds equal no wheeze , rales or rhonchi. No chest wall deformities or tenderness. Breast: normal by inspection . No dimpling, discharge, masses, tenderness or discharge . CV: PMI is nondisplaced, S1 S2 no gallops, murmurs, rubs. Peripheral pulses are full without delay.No JVD .  ABDOMEN: Bowel sounds normal nontender  No guard or rebound, no hepato splenomegal no CVA tenderness.  No hernia. Extremtities:  No clubbing cyanosis or edema, no acute joint swelling or redness no focal atrophy NEURO:  Oriented x3, cranial nerves 3-12 appear to be intact, no obvious focal weakness,gait within normal limits no abnormal reflexes or asymmetrical SKIN: No acute rashes normal turgor, color, no bruising or petechiae. Some sun changes distal great toes with white discoloration distally no redness PSYCH: Oriented, good eye contact, no obvious depression anxiety, cognition and judgment appear normal. LN: no cervical axillary inguinal adenopathy No noted deficits in memory, attention, and speech.   Lab Results  Component Value Date   WBC 9.5 03/13/2014   HGB 13.1 03/13/2014   HCT 39.0 03/13/2014   PLT 281.0 03/13/2014   GLUCOSE 92 03/13/2014   CHOL 205* 03/13/2014   TRIG 101.0 03/13/2014   HDL 65.00 03/13/2014   LDLDIRECT 121.2 03/12/2013   LDLCALC 120* 03/13/2014   ALT 18 03/13/2014   AST 21 03/13/2014   NA 142 03/13/2014   K 3.8 03/13/2014   CL 109 03/13/2014   CREATININE 0.6 03/13/2014   BUN 9 03/13/2014   CO2 27 03/13/2014   TSH 1.06 03/13/2014   HGBA1C 4.9 03/12/2013    ASSESSMENT AND PLAN:  Discussed the following assessment and plan:  Visit for preventive health examination - prevnar get mammo  Other and unspecified hyperlipidemia -  better continue lsi  Estrogen deficiency - dexa scan - Plan: DG Bone Density  Need for vaccination with 13-polyvalent pneumococcal conjugate vaccine - Plan: Pneumococcal conjugate vaccine 13-valent  Patient Care Team: Burnis Medin, MD as PCP - General Thayer Headings, MD (Cardiology) Juanita Craver, MD as Attending Physician (Gastroenterology)  Patient Instructions  Continue lifestyle intervention healthy eating and exercise . 150 minutes of exercise weeks  ,  Lose weight  To healthy levels. Avoid trans fats and processed foods;  Increase fresh fruits and veges to 5 servings per day. And avoid sweet beverages  Including tea and juice.  Get dexa scan  Will get your results . Otherwise  Preventive visit in a year .     Standley Brooking. [employer]  M.D.  Pre visit review using our clinic review tool, if applicable. No additional management support is needed unless otherwise documented below in the visit note.

## 2014-09-08 ENCOUNTER — Encounter: Payer: Self-pay | Admitting: Internal Medicine

## 2014-09-08 ENCOUNTER — Ambulatory Visit (INDEPENDENT_AMBULATORY_CARE_PROVIDER_SITE_OTHER): Payer: Medicare Other | Admitting: Internal Medicine

## 2014-09-08 ENCOUNTER — Telehealth: Payer: Self-pay | Admitting: Internal Medicine

## 2014-09-08 VITALS — BP 132/80 | Temp 97.5°F | Wt 189.6 lb

## 2014-09-08 DIAGNOSIS — R609 Edema, unspecified: Secondary | ICD-10-CM

## 2014-09-08 DIAGNOSIS — IMO0002 Reserved for concepts with insufficient information to code with codable children: Secondary | ICD-10-CM

## 2014-09-08 DIAGNOSIS — R600 Localized edema: Secondary | ICD-10-CM | POA: Insufficient documentation

## 2014-09-08 DIAGNOSIS — M25461 Effusion, right knee: Secondary | ICD-10-CM

## 2014-09-08 DIAGNOSIS — S86919A Strain of unspecified muscle(s) and tendon(s) at lower leg level, unspecified leg, initial encounter: Secondary | ICD-10-CM | POA: Insufficient documentation

## 2014-09-08 DIAGNOSIS — M25469 Effusion, unspecified knee: Secondary | ICD-10-CM

## 2014-09-08 DIAGNOSIS — S86911A Strain of unspecified muscle(s) and tendon(s) at lower leg level, right leg, initial encounter: Secondary | ICD-10-CM

## 2014-09-08 NOTE — Progress Notes (Signed)
Pre visit review using our clinic review tool, if applicable. No additional management support is needed unless otherwise documented below in the visit note.  Chief Complaint  Patient presents with  . Knee Pain    Twisted her leg on the beach a month ago.  Has swelling from her rt knee to her rt ankle.  Painful.  . Joint Swelling    HPI: Patient Samantha [name]  comes in today for SDA for  new problem evaluation. Onset about 3 weeks ago when walking in sand.  ? No popping but felt acute pulling when walking on the sand when happened no falling  Using ice and aleve  But getting more painful  .  This week used more actioviy now limping  And noted whoile lef was more swlollen that left   Hx leg knee injury fell.   And saw  Has seen dr Emiliano Dyer in the past. ocass asa .  Wrapped  Was a contusion. No hx dvt no instability falling but more painful . Called ortho office and nothing available for 2-3 weeks  ROS: See pertinent positives and negatives per HPI.  Past Medical History  Diagnosis Date  . Colonic polyp     Family History  Problem Relation Age of Onset  . Heart attack Mother     4  . Hypertension Mother   . Cancer Sister     colon - 56  . Other Daughter     murdered  .       History   Social History  . Marital Status: Divorced    Spouse Name: N/A    Number of Children: N/A  . Years of Education: N/A   Social History Main Topics  . Smoking status: Never Smoker   . Smokeless tobacco: None  . Alcohol Use: No  . Drug Use: No  . Sexual Activity:    Other Topics Concern  . None   Social History Narrative   Occupation: Retired Occupational hygienist   Divorced   Regular exercise -  Only ocass now   [place] of 1    etoh 1-2 per week   Daughter murdered in her house    No outpatient encounter prescriptions on file as of 09/08/2014.    EXAM:  BP 132/80  Temp(Src) 97.5 F (36.4 C) (Oral)  Wt 189 lb 9.6 oz (86.002 kg)  Body mass index is 35.84 kg/(m^2).  GENERAL: vitals  reviewed and listed above, alert, oriented, appears well hydrated and in no acute distress limping  MS: antalgic gait  Right knee1+ effusion peripatellar and firmness tender pink area  medial inferior knee  1-2+ edema from knee down at ankle   Non tender   Neg homans or warmth . PSYCH: pleasant and cooperative, no obvious depression or anxiety  ASSESSMENT AND PLAN:  Discussed the following assessment and plan:  Knee swelling, right - Plan: Ambulatory referral to Orthopedic Surgery  Knee strain, right, initial encounter - Plan: Ambulatory referral to Orthopedic Surgery  Edema of right lower extremity - Plan: Ambulatory referral to Orthopedic Surgery Diff dx explained  Curious swelling ? Bakers cyst ? Other   Plan ortho this week but if progressing contact us  .  Expectant management  -Patient advised to return or notify health care team  if symptoms worsen ,persist or new concerns arise.  Patient Instructions  This may be a knee strain ? mcl other  Swelling not easily   Explained   Poss   Bakers cyst?  Doubt  Clots with the presentation.  Will do urgent referral . This week  In the interim.     Standley Brooking. Cleaster Shiffer M.D.

## 2014-09-08 NOTE — Telephone Encounter (Signed)
Patient Information:  Caller Name: Lakisha  Phone: [phone]  Patient: Samantha [name], Samantha [name]  Gender: Female  DOB: 1946/01/10  Age: 68 Years  PCP: Shanon Ace (Family Practice)  Office Follow Up:  Does the office need to follow up with this patient?: No  Instructions For The Office: N/A   Symptoms  Reason For Call & Symptoms: Pt is calling and states that she was at the beach approx 1 month ago and twisted right  knee;   right knee is swollen and painful;  able to walk;  Reviewed Health History In EMR: Yes  Reviewed Medications In EMR: Yes  Reviewed Allergies In EMR: Yes  Reviewed Surgeries / Procedures: Yes  Date of Onset of Symptoms: 08/08/2014  Treatments Tried: wrapping knee and keeping ice on it  Treatments Tried Worked: No  Guideline(s) Used:  Knee Injury  Disposition Per Guideline:   See Today in Office  Reason For Disposition Reached:   Patient wants to be seen  Advice Given:  Call Back If:  You become worse.  Patient Will Follow Care Advice:  YES  Appointment Scheduled:  09/08/2014 16:15:00 Appointment Scheduled Provider:  Shanon Ace (Family Practice) Pt requested the last appt for the day due to another appt that she has today.

## 2014-09-08 NOTE — Patient Instructions (Addendum)
This may be a knee strain ? mcl other  Swelling not easily   Explained   Poss   Bakers cyst?  Doubt  Clots with the presentation.  Will do urgent referral . This week  In the interim.

## 2014-09-09 ENCOUNTER — Other Ambulatory Visit: Payer: Self-pay | Admitting: Sports Medicine

## 2014-09-09 DIAGNOSIS — M25561 Pain in right knee: Secondary | ICD-10-CM

## 2014-09-20 ENCOUNTER — Other Ambulatory Visit: Payer: Medicare Other

## 2014-10-10 ENCOUNTER — Other Ambulatory Visit: Payer: Self-pay

## 2014-10-16 ENCOUNTER — Ambulatory Visit (INDEPENDENT_AMBULATORY_CARE_PROVIDER_SITE_OTHER): Payer: Medicare Other

## 2014-10-16 DIAGNOSIS — Z23 Encounter for immunization: Secondary | ICD-10-CM

## 2014-10-27 ENCOUNTER — Encounter: Payer: Self-pay | Admitting: Internal Medicine

## 2015-05-28 ENCOUNTER — Telehealth: Payer: Self-pay

## 2015-05-28 NOTE — Telephone Encounter (Signed)
[facility] so that she may schedule her mammagram

## 2015-05-28 NOTE — Telephone Encounter (Signed)
Left a message for pt to return call about scheduling a mammogram.

## 2015-06-04 LAB — HM MAMMOGRAPHY

## 2015-06-05 ENCOUNTER — Encounter: Payer: Self-pay | Admitting: Family Medicine

## 2015-08-17 ENCOUNTER — Encounter: Payer: Self-pay | Admitting: *Deleted

## 2016-06-22 ENCOUNTER — Encounter: Payer: Self-pay | Admitting: Internal Medicine

## 2016-06-22 ENCOUNTER — Ambulatory Visit (INDEPENDENT_AMBULATORY_CARE_PROVIDER_SITE_OTHER): Payer: Medicare Other | Admitting: Internal Medicine

## 2016-06-22 VITALS — BP 160/82 | Temp 97.7°F | Ht 60.5 in | Wt 185.8 lb

## 2016-06-22 DIAGNOSIS — IMO0001 Reserved for inherently not codable concepts without codable children: Secondary | ICD-10-CM

## 2016-06-22 DIAGNOSIS — R030 Elevated blood-pressure reading, without diagnosis of hypertension: Secondary | ICD-10-CM

## 2016-06-22 DIAGNOSIS — E2839 Other primary ovarian failure: Secondary | ICD-10-CM

## 2016-06-22 DIAGNOSIS — Z0000 Encounter for general adult medical examination without abnormal findings: Secondary | ICD-10-CM

## 2016-06-22 DIAGNOSIS — E669 Obesity, unspecified: Secondary | ICD-10-CM

## 2016-06-22 LAB — LIPID PANEL
CHOL/HDL RATIO: 4
CHOLESTEROL: 237 mg/dL — AB (ref 0–200)
HDL: 65.8 mg/dL (ref >39.00)
LDL Cholesterol: 146 mg/dL — ABNORMAL HIGH (ref 0–99)
NONHDL: 170.97
Triglycerides: 127 mg/dL (ref 0.0–149.0)
VLDL: 25.4 mg/dL (ref 0.0–40.0)

## 2016-06-22 LAB — CBC WITH DIFFERENTIAL/PLATELET
BASOS ABS: 0 10*3/uL (ref 0.0–0.1)
Basophils Relative: 0.4 % (ref 0.0–3.0)
Eosinophils Absolute: 0.2 10*3/uL (ref 0.0–0.7)
Eosinophils Relative: 1.6 % (ref 0.0–5.0)
HCT: 41 % (ref 36.0–46.0)
HEMOGLOBIN: 13.8 g/dL (ref 12.0–15.0)
Lymphocytes Relative: 24.7 % (ref 12.0–46.0)
Lymphs Abs: 2.8 10*3/uL (ref 0.7–4.0)
MCHC: 33.7 g/dL (ref 30.0–36.0)
MCV: 93.8 fl (ref 78.0–100.0)
Monocytes Absolute: 0.6 10*3/uL (ref 0.1–1.0)
Monocytes Relative: 5.2 % (ref 3.0–12.0)
NEUTROS PCT: 68.1 % (ref 43.0–77.0)
Neutro Abs: 7.6 10*3/uL (ref 1.4–7.7)
Platelets: 294 10*3/uL (ref 150.0–400.0)
RBC: 4.37 Mil/uL (ref 3.87–5.11)
RDW: 12.5 % (ref 11.5–15.5)
WBC: 11.2 10*3/uL — ABNORMAL HIGH (ref 4.0–10.5)

## 2016-06-22 LAB — BASIC METABOLIC PANEL
BUN: 9 mg/dL (ref 6–23)
CALCIUM: 9 mg/dL (ref 8.4–10.5)
CO2: 30 mEq/L (ref 19–32)
Chloride: 105 mEq/L (ref 96–112)
Creatinine, Ser: 0.55 mg/dL (ref 0.40–1.20)
GFR: 116.25 mL/min (ref >60.00)
Glucose, Bld: 86 mg/dL (ref 70–99)
Potassium: 4.1 mEq/L (ref 3.5–5.1)
SODIUM: 141 meq/L (ref 135–145)

## 2016-06-22 LAB — HEPATIC FUNCTION PANEL
ALBUMIN: 4.2 g/dL (ref 3.5–5.2)
ALK PHOS: 90 U/L (ref 39–117)
ALT: 12 U/L (ref 0–35)
AST: 15 U/L (ref 0–37)
Bilirubin, Direct: 0.1 mg/dL (ref 0.0–0.3)
Total Bilirubin: 0.8 mg/dL (ref 0.2–1.2)
Total Protein: 7 g/dL (ref 6.0–8.3)

## 2016-06-22 LAB — TSH: TSH: 1.19 u[IU]/mL (ref 0.35–4.50)

## 2016-06-22 NOTE — Progress Notes (Signed)
Pre visit review using our clinic review tool, if applicable. No additional management support is needed unless otherwise documented below in the visit note.  Chief Complaint  Patient presents with  . Medicare Wellness    annual exam  traveling     HPI: Samantha [name] 70 y.o. comes in today for Preventive Medicare wellness visit .Since last visit. hasays has ben ok  But has ms sx and  Joint pains but doing ok   Plans trip to [place] travel.   Health Maintenance  Topic Date Due  . DEXA SCAN  06/22/2017 (Originally 10/21/2011)  . Hepatitis C Screening  06/22/2017 (Originally 06-07-1946)  . INFLUENZA VACCINE  07/26/2016  . TETANUS/TDAP  12/26/2016  . MAMMOGRAM  06/03/2017  . COLONOSCOPY  04/02/2023  . ZOSTAVAX  Completed  . PNA vac Low Risk Adult  Completed   Health Maintenance Review LIFESTYLE:  TAD etoh 7 per week  Sugar beverages:  No    Sleep:   Pretty good    One wakening   MEDICARE DOCUMENT QUESTIONS  TO SCAN   Hearing: dec on right   Vision:  No limitations at present . Last eye check UTD  Safety:  Has smoke detector and wears seat belts.  No firearms. No excess sun exposure. Sees dentist regularly.  Falls:   Advance directive :  Reviewed  Has one.   Memory: Felt to be good  ocass names    , no concern from her or her family.  Depression: No anhedonia unusual crying or depressive symptoms  Nutrition: Eats well balanced diet; adequate calcium and vitamin D. No swallowing chewing problems.  Injury: no major injuries in the last six months.  Other healthcare providers:  Reviewed today .  Social:  Lives with alone  No pets.   Preventive parameters: up-to-date  Reviewed   ADLS:   There are no problems or need for assistance  driving, feeding, obtaining food, dressing, toileting and bathing, managing money using phone. She is independent.  ROS:  GEN/ HEENT: No fever, significant weight changes sweats headaches vision problems hearing changes, CV/ PULM; No chest  pain shortness of breath cough, syncope,edema  change in exercise tolerance. GI /GU: No adominal pain, vomiting, change in bowel habits. No blood in the stool. No significant GU symptoms. SKIN/HEME: ,no acute skin rashes suspicious lesions or bleeding. No lymphadenopathy, nodules, masses.  NEURO/ PSYCH:  No neurologic signs such as weakness numbness. No depression anxiety. IMM/ Allergy: No unusual infections.  Allergy .   REST of 12 system review negative except as per HPI   Past Medical History  Diagnosis Date  . Colonic polyp     Family History  Problem Relation Age of Onset  . Heart attack Mother     64  . Hypertension Mother   . Cancer Sister     colon - 71  . Other Daughter     murdered  .       Social History   Social History  . Marital Status: Divorced    Spouse Name: N/A  . Number of Children: N/A  . Years of Education: N/A   Social History Main Topics  . Smoking status: Never Smoker   . Smokeless tobacco: Never Used  . Alcohol Use: 0.0 oz/week    0 Standard drinks or equivalent per week     Comment: 1-2 glasses per night  . Drug Use: No  . Sexual Activity: Not Asked   Other Topics Concern  . None  Social History Narrative   Occupation: Retired Occupational hygienist   Divorced   Regular exercise -  Only ocass now   [place] of 1    etoh 1-2 per week   Daughter murdered in her house    No outpatient encounter prescriptions on file as of 06/22/2016.   No facility-administered encounter medications on file as of 06/22/2016.    EXAM:  BP 160/82 mmHg  Temp(Src) 97.7 F (36.5 C) (Oral)  Ht 5' 0.5" (1.537 m)  Wt 185 lb 12.8 oz (84.278 kg)  BMI 35.68 kg/m2  Body mass index is 35.68 kg/(m^2).  Physical Exam: Vital signs reviewed YBO:FBPZ is a well-developed well-nourished alert cooperative   who appears stated age in no acute distress. Glasses  HEENT: normocephalic atraumatic , Eyes: PERRL EOM's full, conjunctiva clear, Nares: paten,t no deformity discharge or  tenderness., Ears: no deformity EAC's clear TMs with normal landmarks. Mouth: clear OP, no lesions, edema.  Moist mucous membranes. Dentition in adequate repair. NECK: supple without masses, thyromegaly or bruits. CHEST/PULM:  Clear to auscultation and percussion breath sounds equal no wheeze , rales or rhonchi. No chest wall deformities or tenderness. CV: PMI is nondisplaced, S1 S2 no gallops, murmurs, rubs. Peripheral pulses are full without delay.No JVD . Breast: normal by inspection . No dimpling, discharge, masses, tenderness or discharge . ABDOMEN: Bowel sounds normal nontender  No guard or rebound, no hepato splenomegal no CVA tenderness.   Extremtities:  No clubbing cyanosis or edema, no acute joint swelling or redness no focal atrophy slow to lay on table no acute changes  NEURO:  Oriented x3, cranial nerves 3-12 appear to be intact, no obvious focal weakness,gait within normal limits no abnormal reflexes or asymmetrical SKIN: No acute rashes normal turgor, color, no bruising or petechiae. PSYCH: Oriented, good eye contact, no obvious depression anxiety, cognition and judgment appear normal. LN: no cervical axillary inguinal adenopathy No noted deficits in memory, attention, and speech.  ASSESSMENT AND PLAN:  Discussed the following assessment and plan:  Visit for preventive health examination - Plan: Basic metabolic panel, CBC with Differential/Platelet, Hepatic function panel, Lipid panel, TSH  Medicare annual wellness visit, subsequent  Estrogen deficiency - Plan: DG Bone Density  Elevated BP  Obesity dexa  Discussed  After initial declination may go ahead and  Get dexa .  Disc bp readings  And rx needed for heard health   Travel information given  Patient Care Team: Burnis Medin, MD as PCP - General Thayer Headings, MD (Cardiology) Juanita Craver, MD as Attending Physician (Gastroenterology)  Patient Instructions   reconsider getting dexa scan to look at risk of future  fracture  . Make appt  I will do the order    Continue weight bearing exercise  And calcum vit d in foods.    If bp over 140/90  We need to treat to decrease risk of heart disease and stroke.   Take blood pressure readings twice a day for 7- 10 days and then periodically .To ensure below 140/90   .Record Send in readings      Dash diet eating is healthy.   Will notify you  of labs when available.  ROV in 1-2 months with BP readings   And machine    Even a Small amount of healthy weight loss can help your BP  And health   consdier adding  Lisinopril 5 mg to take one per day  For HT.   Consider getting hep b and hepA  Vaccine series   For travel ( called twinrix) update your td vaccine ( medicare may not pay) and get on line to [email or web address] .[email or web address] travel site for  Advice for individual locations   Advice   Health Maintenance, Female Adopting a healthy lifestyle and getting preventive care can go a long way to promote health and wellness. Talk with your health care provider about what schedule of regular examinations is right for you. This is a good chance for you to check in with your provider about disease prevention and staying healthy. In between checkups, there are plenty of things you can do on your own. Experts have done a lot of research about which lifestyle changes and preventive measures are most likely to keep you healthy. Ask your health care provider for more information. WEIGHT AND DIET  Eat a healthy diet  Be sure to include plenty of vegetables, fruits, low-fat dairy products, and lean protein.  Do not eat a lot of foods high in solid fats, added sugars, or salt.  Get regular exercise. This is one of the most important things you can do for your health.  Most adults should exercise for at least 150 minutes each week. The exercise should increase your heart rate and make you sweat (moderate-intensity exercise).  Most adults should also do strengthening exercises at least twice a  week. This is in addition to the moderate-intensity exercise.  Maintain a healthy weight  Body mass index (BMI) is a measurement that can be used to identify possible weight problems. It estimates body fat based on height and weight. Your health care provider can help determine your BMI and help you achieve or maintain a healthy weight.  For females 67 years of age and older:   A BMI below 18.5 is considered underweight.  A BMI of 18.5 to 24.9 is normal.  A BMI of 25 to 29.9 is considered overweight.  A BMI of 30 and above is considered obese.  Watch levels of cholesterol and blood lipids  You should start having your blood tested for lipids and cholesterol at 70 years of age, then have this test every 5 years.  You may need to have your cholesterol levels checked more often if:  Your lipid or cholesterol levels are high.  You are older than 70 years of age.  You are at high risk for heart disease.  CANCER SCREENING   Lung Cancer  Lung cancer screening is recommended for adults 83-59 years old who are at high risk for lung cancer because of a history of smoking.  A yearly low-dose CT scan of the lungs is recommended for people who:  Currently smoke.  Have quit within the past 15 years.  Have at least a 30-pack-year history of smoking. A pack year is smoking an average of one pack of cigarettes a day for 1 year.  Yearly screening should continue until it has been 15 years since you quit.  Yearly screening should stop if you develop a health problem that would prevent you from having lung cancer treatment.  Breast Cancer  Practice breast self-awareness. This means understanding how your breasts normally appear and feel.  It also means doing regular breast self-exams. Let your health care provider know about any changes, no matter how small.  If you are in your 20s or 30s, you should have a clinical breast exam (CBE) by a health care provider every 1-3 years as part  of a regular health exam.  If you are  79 or older, have a CBE every year. Also consider having a breast X-ray (mammogram) every year.  If you have a family history of breast cancer, talk to your health care provider about genetic screening.  If you are at high risk for breast cancer, talk to your health care provider about having an MRI and a mammogram every year.  Breast cancer gene (BRCA) assessment is recommended for women who have family members with BRCA-related cancers. BRCA-related cancers include:  Breast.  Ovarian.  Tubal.  Peritoneal cancers.  Results of the assessment will determine the need for genetic counseling and BRCA1 and BRCA2 testing. Cervical Cancer Your health care provider may recommend that you be screened regularly for cancer of the pelvic organs (ovaries, uterus, and vagina). This screening involves a pelvic examination, including checking for microscopic changes to the surface of your cervix (Pap test). You may be encouraged to have this screening done every 3 years, beginning at age 66.  For women ages 28-65, health care providers may recommend pelvic exams and Pap testing every 3 years, or they may recommend the Pap and pelvic exam, combined with testing for human papilloma virus (HPV), every 5 years. Some types of HPV increase your risk of cervical cancer. Testing for HPV may also be done on women of any age with unclear Pap test results.  Other health care providers may not recommend any screening for nonpregnant women who are considered low risk for pelvic cancer and who do not have symptoms. Ask your health care provider if a screening pelvic exam is right for you.  If you have had past treatment for cervical cancer or a condition that could lead to cancer, you need Pap tests and screening for cancer for at least 20 years after your treatment. If Pap tests have been discontinued, your risk factors (such as having a new sexual partner) need to be reassessed to  determine if screening should resume. Some women have medical problems that increase the chance of getting cervical cancer. In these cases, your health care provider may recommend more frequent screening and Pap tests. Colorectal Cancer  This type of cancer can be detected and often prevented.  Routine colorectal cancer screening usually begins at 70 years of age and continues through 71 years of age.  Your health care provider may recommend screening at an earlier age if you have risk factors for colon cancer.  Your health care provider may also recommend using home test kits to check for hidden blood in the stool.  A small camera at the end of a tube can be used to examine your colon directly (sigmoidoscopy or colonoscopy). This is done to check for the earliest forms of colorectal cancer.  Routine screening usually begins at age 60.  Direct examination of the colon should be repeated every 5-10 years through 70 years of age. However, you may need to be screened more often if early forms of precancerous polyps or small growths are found. Skin Cancer  Check your skin from head to toe regularly.  Tell your health care provider about any new moles or changes in moles, especially if there is a change in a mole's shape or color.  Also tell your health care provider if you have a mole that is larger than the size of a pencil eraser.  Always use sunscreen. Apply sunscreen liberally and repeatedly throughout the day.  Protect yourself by wearing long sleeves, pants, a wide-brimmed hat, and sunglasses whenever you are outside. HEART DISEASE,  DIABETES, AND HIGH BLOOD PRESSURE   High blood pressure causes heart disease and increases the risk of stroke. High blood pressure is more likely to develop in:  People who have blood pressure in the high end of the normal range (130-139/85-89 mm Hg).  People who are overweight or obese.  People who are African American.  If you are 18-39 years of  age, have your blood pressure checked every 3-5 years. If you are 66 years of age or older, have your blood pressure checked every year. You should have your blood pressure measured twice--once when you are at a hospital or clinic, and once when you are not at a hospital or clinic. Record the average of the two measurements. To check your blood pressure when you are not at a hospital or clinic, you can use:  An automated blood pressure machine at a pharmacy.  A home blood pressure monitor.  If you are between 54 years and 65 years old, ask your health care provider if you should take aspirin to prevent strokes.  Have regular diabetes screenings. This involves taking a blood sample to check your fasting blood sugar level.  If you are at a normal weight and have a low risk for diabetes, have this test once every three years after 70 years of age.  If you are overweight and have a high risk for diabetes, consider being tested at a younger age or more often. PREVENTING INFECTION  Hepatitis B  If you have a higher risk for hepatitis B, you should be screened for this virus. You are considered at high risk for hepatitis B if:  You were born in a country where hepatitis B is common. Ask your health care provider which countries are considered high risk.  Your parents were born in a high-risk country, and you have not been immunized against hepatitis B (hepatitis B vaccine).  You have HIV or AIDS.  You use needles to inject street drugs.  You live with someone who has hepatitis B.  You have had sex with someone who has hepatitis B.  You get hemodialysis treatment.  You take certain medicines for conditions, including cancer, organ transplantation, and autoimmune conditions. Hepatitis C  Blood testing is recommended for:  Everyone born from [birth year] through 1965.  Anyone with known risk factors for hepatitis C. Sexually transmitted infections (STIs)  You should be screened for sexually  transmitted infections (STIs) including gonorrhea and chlamydia if:  You are sexually active and are younger than 70 years of age.  You are older than 70 years of age and your health care provider tells you that you are at risk for this type of infection.  Your sexual activity has changed since you were last screened and you are at an increased risk for chlamydia or gonorrhea. Ask your health care provider if you are at risk.  If you do not have HIV, but are at risk, it may be recommended that you take a prescription medicine daily to prevent HIV infection. This is called pre-exposure prophylaxis (PrEP). You are considered at risk if:  You are sexually active and do not regularly use condoms or know the HIV status of your partner(s).  You take drugs by injection.  You are sexually active with a partner who has HIV. Talk with your health care provider about whether you are at high risk of being infected with HIV. If you choose to begin PrEP, you should first be tested for HIV. You should then  be tested every 3 months for as long as you are taking PrEP.  PREGNANCY   If you are premenopausal and you may become pregnant, ask your health care provider about preconception counseling.  If you may become pregnant, take 400 to 800 micrograms (mcg) of folic acid every day.  If you want to prevent pregnancy, talk to your health care provider about birth control (contraception). OSTEOPOROSIS AND MENOPAUSE   Osteoporosis is a disease in which the bones lose minerals and strength with aging. This can result in serious bone fractures. Your risk for osteoporosis can be identified using a bone density scan.  If you are 87 years of age or older, or if you are at risk for osteoporosis and fractures, ask your health care provider if you should be screened.  Ask your health care provider whether you should take a calcium or vitamin D supplement to lower your risk for osteoporosis.  Menopause may have  certain physical symptoms and risks.  Hormone replacement therapy may reduce some of these symptoms and risks. Talk to your health care provider about whether hormone replacement therapy is right for you.  HOME CARE INSTRUCTIONS   Schedule regular health, dental, and eye exams.  Stay current with your immunizations.   Do not use any tobacco products including cigarettes, chewing tobacco, or electronic cigarettes.  If you are pregnant, do not drink alcohol.  If you are breastfeeding, limit how much and how often you drink alcohol.  Limit alcohol intake to no more than 1 drink per day for nonpregnant women. One drink equals 12 ounces of beer, 5 ounces of wine, or 1 ounces of hard liquor.  Do not use street drugs.  Do not share needles.  Ask your health care provider for help if you need support or information about quitting drugs.  Tell your health care provider if you often feel depressed.  Tell your health care provider if you have ever been abused or do not feel safe at home.   This information is not intended to replace advice given to you by your health care provider. Make sure you discuss any questions you have with your health care provider.   Document Released: 06/27/2011 Document Revised: 01/02/2015 Document Reviewed: 11/13/2013 Elsevier Interactive Patient Education 2016 [place] DASH stands for "Dietary Approaches to Stop Hypertension." The DASH eating plan is a healthy eating plan that has been shown to reduce high blood pressure (hypertension). Additional health benefits may include reducing the risk of type 2 diabetes mellitus, heart disease, and stroke. The DASH eating plan may also help with weight loss. WHAT DO I NEED TO KNOW ABOUT THE DASH EATING PLAN? For the DASH eating plan, you will follow these general guidelines:  Choose foods with a percent daily value for sodium of less than 5% (as listed on the food label).  Use salt-free  seasonings or herbs instead of table salt or sea salt.  Check with your health care provider or pharmacist before using salt substitutes.  Eat lower-sodium products, often labeled as "lower sodium" or "no salt added."  Eat fresh foods.  Eat more vegetables, fruits, and low-fat dairy products.  Choose whole grains. Look for the word "whole" as the first word in the ingredient list.  Choose fish and skinless chicken or [place] more often than red meat. Limit fish, poultry, and meat to 6 oz (170 g) each day.  Limit sweets, desserts, sugars, and sugary drinks.  Choose heart-healthy fats.  Limit  cheese to 1 oz (28 g) per day.  Eat more home-cooked food and less restaurant, buffet, and fast food.  Limit fried foods.  Cook foods using methods other than frying.  Limit canned vegetables. If you do use them, rinse them well to decrease the sodium.  When eating at a restaurant, ask that your food be prepared with less salt, or no salt if possible. WHAT FOODS CAN I EAT? Seek help from a dietitian for individual calorie needs. Grains Whole grain or whole wheat bread. Brown rice. Whole grain or whole wheat pasta. Quinoa, bulgur, and whole grain cereals. Low-sodium cereals. Corn or whole wheat flour tortillas. Whole grain cornbread. Whole grain crackers. Low-sodium crackers. Vegetables Fresh or frozen vegetables (raw, steamed, roasted, or grilled). Low-sodium or reduced-sodium tomato and vegetable juices. Low-sodium or reduced-sodium tomato sauce and paste. Low-sodium or reduced-sodium canned vegetables.  Fruits All fresh, canned (in natural juice), or frozen fruits. Meat and Other Protein Products Ground beef (85% or leaner), grass-fed beef, or beef trimmed of fat. Skinless chicken or [place]. Ground chicken or [place]. Pork trimmed of fat. All fish and seafood. Eggs. Dried beans, peas, or lentils. Unsalted nuts and seeds. Unsalted canned beans. Dairy Low-fat dairy products, such as skim or  1% milk, 2% or reduced-fat cheeses, low-fat ricotta or cottage cheese, or plain low-fat yogurt. Low-sodium or reduced-sodium cheeses. Fats and Oils Tub margarines without trans fats. Light or reduced-fat mayonnaise and salad dressings (reduced sodium). Avocado. Safflower, olive, or canola oils. Natural peanut or almond butter. Other Unsalted popcorn and pretzels. The items listed above may not be a complete list of recommended foods or beverages. Contact your dietitian for more options. WHAT FOODS ARE NOT RECOMMENDED? Grains White bread. White pasta. White rice. Refined cornbread. Bagels and croissants. Crackers that contain trans fat. Vegetables Creamed or fried vegetables. Vegetables in a cheese sauce. Regular canned vegetables. Regular canned tomato sauce and paste. Regular tomato and vegetable juices. Fruits Dried fruits. Canned fruit in light or heavy syrup. Fruit juice. Meat and Other Protein Products Fatty cuts of meat. Ribs, chicken wings, bacon, sausage, bologna, salami, chitterlings, fatback, hot dogs, bratwurst, and packaged luncheon meats. Salted nuts and seeds. Canned beans with salt. Dairy Whole or 2% milk, cream, half-and-half, and cream cheese. Whole-fat or sweetened yogurt. Full-fat cheeses or blue cheese. Nondairy creamers and whipped toppings. Processed cheese, cheese spreads, or cheese curds. Condiments Onion and garlic salt, seasoned salt, table salt, and sea salt. Canned and packaged gravies. Worcestershire sauce. Tartar sauce. Barbecue sauce. Teriyaki sauce. Soy sauce, including reduced sodium. Steak sauce. Fish sauce. Oyster sauce. Cocktail sauce. Horseradish. Ketchup and mustard. Meat flavorings and tenderizers. Bouillon cubes. Hot sauce. Tabasco sauce. Marinades. Taco seasonings. Relishes. Fats and Oils Butter, stick margarine, lard, shortening, ghee, and bacon fat. Coconut, palm kernel, or palm oils. Regular salad dressings. Other Pickles and olives. Salted popcorn  and pretzels. The items listed above may not be a complete list of foods and beverages to avoid. Contact your dietitian for more information. WHERE CAN I FIND MORE INFORMATION? National Heart, Lung, and Blood Institute: travelstabloid.com   This information is not intended to replace advice given to you by your health care provider. Make sure you discuss any questions you have with your health care provider.   Document Released: 12/01/2011 Document Revised: 01/02/2015 Document Reviewed: 10/16/2013 Elsevier Interactive Patient Education 2016 [place]. Susane Bey M.D.

## 2016-06-22 NOTE — Patient Instructions (Addendum)
reconsider getting dexa scan to look at risk of future fracture  . Make appt  I will do the order    Continue weight bearing exercise  And calcum vit d in foods.    If bp over 140/90  We need to treat to decrease risk of heart disease and stroke.   Take blood pressure readings twice a day for 7- 10 days and then periodically .To ensure below 140/90   .Record Send in readings      Dash diet eating is healthy.   Will notify you  of labs when available.  ROV in 1-2 months with BP readings   And machine    Even a Small amount of healthy weight loss can help your BP  And health   consdier adding  Lisinopril 5 mg to take one per day  For HT.   Consider getting hep b and hepA  Vaccine series   For travel ( called twinrix) update your td vaccine ( medicare may not pay) and get on line to dcd [place] travel site for  Advice for individual locations   Advice   Health Maintenance, Female Adopting a healthy lifestyle and getting preventive care can go a long way to promote health and wellness. Talk with your health care provider about what schedule of regular examinations is right for you. This is a good chance for you to check in with your provider about disease prevention and staying healthy. In between checkups, there are plenty of things you can do on your own. Experts have done a lot of research about which lifestyle changes and preventive measures are most likely to keep you healthy. Ask your health care provider for more information. WEIGHT AND DIET  Eat a healthy diet  Be sure to include plenty of vegetables, fruits, low-fat dairy products, and lean protein.  Do not eat a lot of foods high in solid fats, added sugars, or salt.  Get regular exercise. This is one of the most important things you can do for your health.  Most adults should exercise for at least 150 minutes each week. The exercise should increase your heart rate and make you sweat (moderate-intensity exercise).  Most  adults should also do strengthening exercises at least twice a week. This is in addition to the moderate-intensity exercise.  Maintain a healthy weight  Body mass index (BMI) is a measurement that can be used to identify possible weight problems. It estimates body fat based on height and weight. Your health care provider can help determine your BMI and help you achieve or maintain a healthy weight.  For females 65 years of age and older:   A BMI below 18.5 is considered underweight.  A BMI of 18.5 to 24.9 is normal.  A BMI of 25 to 29.9 is considered overweight.  A BMI of 30 and above is considered obese.  Watch levels of cholesterol and blood lipids  You should start having your blood tested for lipids and cholesterol at 70 years of age, then have this test every 5 years.  You may need to have your cholesterol levels checked more often if:  Your lipid or cholesterol levels are high.  You are older than 69 years of age.  You are at high risk for heart disease.  CANCER SCREENING   Lung Cancer  Lung cancer screening is recommended for adults 52-25 years old who are at high risk for lung cancer because of a history of smoking.  A yearly  low-dose CT scan of the lungs is recommended for people who:  Currently smoke.  Have quit within the past 15 years.  Have at least a 30-pack-year history of smoking. A pack year is smoking an average of one pack of cigarettes a day for 1 year.  Yearly screening should continue until it has been 15 years since you quit.  Yearly screening should stop if you develop a health problem that would prevent you from having lung cancer treatment.  Breast Cancer  Practice breast self-awareness. This means understanding how your breasts normally appear and feel.  It also means doing regular breast self-exams. Let your health care provider know about any changes, no matter how small.  If you are in your 20s or 30s, you should have a clinical  breast exam (CBE) by a health care provider every 1-3 years as part of a regular health exam.  If you are 81 or older, have a CBE every year. Also consider having a breast X-ray (mammogram) every year.  If you have a family history of breast cancer, talk to your health care provider about genetic screening.  If you are at high risk for breast cancer, talk to your health care provider about having an MRI and a mammogram every year.  Breast cancer gene (BRCA) assessment is recommended for women who have family members with BRCA-related cancers. BRCA-related cancers include:  Breast.  Ovarian.  Tubal.  Peritoneal cancers.  Results of the assessment will determine the need for genetic counseling and BRCA1 and BRCA2 testing. Cervical Cancer Your health care provider may recommend that you be screened regularly for cancer of the pelvic organs (ovaries, uterus, and vagina). This screening involves a pelvic examination, including checking for microscopic changes to the surface of your cervix (Pap test). You may be encouraged to have this screening done every 3 years, beginning at age 70.  For women ages 13-65, health care providers may recommend pelvic exams and Pap testing every 3 years, or they may recommend the Pap and pelvic exam, combined with testing for human papilloma virus (HPV), every 5 years. Some types of HPV increase your risk of cervical cancer. Testing for HPV may also be done on women of any age with unclear Pap test results.  Other health care providers may not recommend any screening for nonpregnant women who are considered low risk for pelvic cancer and who do not have symptoms. Ask your health care provider if a screening pelvic exam is right for you.  If you have had past treatment for cervical cancer or a condition that could lead to cancer, you need Pap tests and screening for cancer for at least 20 years after your treatment. If Pap tests have been discontinued, your risk  factors (such as having a new sexual partner) need to be reassessed to determine if screening should resume. Some women have medical problems that increase the chance of getting cervical cancer. In these cases, your health care provider may recommend more frequent screening and Pap tests. Colorectal Cancer  This type of cancer can be detected and often prevented.  Routine colorectal cancer screening usually begins at 70 years of age and continues through 70 years of age.  Your health care provider may recommend screening at an earlier age if you have risk factors for colon cancer.  Your health care provider may also recommend using home test kits to check for hidden blood in the stool.  A small camera at the end of a tube can  be used to examine your colon directly (sigmoidoscopy or colonoscopy). This is done to check for the earliest forms of colorectal cancer.  Routine screening usually begins at age 27.  Direct examination of the colon should be repeated every 5-10 years through 70 years of age. However, you may need to be screened more often if early forms of precancerous polyps or small growths are found. Skin Cancer  Check your skin from head to toe regularly.  Tell your health care provider about any new moles or changes in moles, especially if there is a change in a mole's shape or color.  Also tell your health care provider if you have a mole that is larger than the size of a pencil eraser.  Always use sunscreen. Apply sunscreen liberally and repeatedly throughout the day.  Protect yourself by wearing long sleeves, pants, a wide-brimmed hat, and sunglasses whenever you are outside. HEART DISEASE, DIABETES, AND HIGH BLOOD PRESSURE   High blood pressure causes heart disease and increases the risk of stroke. High blood pressure is more likely to develop in:  People who have blood pressure in the high end of the normal range (130-139/85-89 mm Hg).  People who are overweight or  obese.  People who are African American.  If you are 34-40 years of age, have your blood pressure checked every 3-5 years. If you are 4 years of age or older, have your blood pressure checked every year. You should have your blood pressure measured twice--once when you are at a hospital or clinic, and once when you are not at a hospital or clinic. Record the average of the two measurements. To check your blood pressure when you are not at a hospital or clinic, you can use:  An automated blood pressure machine at a pharmacy.  A home blood pressure monitor.  If you are between 47 years and 14 years old, ask your health care provider if you should take aspirin to prevent strokes.  Have regular diabetes screenings. This involves taking a blood sample to check your fasting blood sugar level.  If you are at a normal weight and have a low risk for diabetes, have this test once every three years after 70 years of age.  If you are overweight and have a high risk for diabetes, consider being tested at a younger age or more often. PREVENTING INFECTION  Hepatitis B  If you have a higher risk for hepatitis B, you should be screened for this virus. You are considered at high risk for hepatitis B if:  You were born in a country where hepatitis B is common. Ask your health care provider which countries are considered high risk.  Your parents were born in a high-risk country, and you have not been immunized against hepatitis B (hepatitis B vaccine).  You have HIV or AIDS.  You use needles to inject street drugs.  You live with someone who has hepatitis B.  You have had sex with someone who has hepatitis B.  You get hemodialysis treatment.  You take certain medicines for conditions, including cancer, organ transplantation, and autoimmune conditions. Hepatitis C  Blood testing is recommended for:  Everyone born from [birth year] through 1965.  Anyone with known risk factors for hepatitis  C. Sexually transmitted infections (STIs)  You should be screened for sexually transmitted infections (STIs) including gonorrhea and chlamydia if:  You are sexually active and are younger than 70 years of age.  You are older than 70 years of age  and your health care provider tells you that you are at risk for this type of infection.  Your sexual activity has changed since you were last screened and you are at an increased risk for chlamydia or gonorrhea. Ask your health care provider if you are at risk.  If you do not have HIV, but are at risk, it may be recommended that you take a prescription medicine daily to prevent HIV infection. This is called pre-exposure prophylaxis (PrEP). You are considered at risk if:  You are sexually active and do not regularly use condoms or know the HIV status of your partner(s).  You take drugs by injection.  You are sexually active with a partner who has HIV. Talk with your health care provider about whether you are at high risk of being infected with HIV. If you choose to begin PrEP, you should first be tested for HIV. You should then be tested every 3 months for as long as you are taking PrEP.  PREGNANCY   If you are premenopausal and you may become pregnant, ask your health care provider about preconception counseling.  If you may become pregnant, take 400 to 800 micrograms (mcg) of folic acid every day.  If you want to prevent pregnancy, talk to your health care provider about birth control (contraception). OSTEOPOROSIS AND MENOPAUSE   Osteoporosis is a disease in which the bones lose minerals and strength with aging. This can result in serious bone fractures. Your risk for osteoporosis can be identified using a bone density scan.  If you are 19 years of age or older, or if you are at risk for osteoporosis and fractures, ask your health care provider if you should be screened.  Ask your health care provider whether you should take a calcium or  vitamin D supplement to lower your risk for osteoporosis.  Menopause may have certain physical symptoms and risks.  Hormone replacement therapy may reduce some of these symptoms and risks. Talk to your health care provider about whether hormone replacement therapy is right for you.  HOME CARE INSTRUCTIONS   Schedule regular health, dental, and eye exams.  Stay current with your immunizations.   Do not use any tobacco products including cigarettes, chewing tobacco, or electronic cigarettes.  If you are pregnant, do not drink alcohol.  If you are breastfeeding, limit how much and how often you drink alcohol.  Limit alcohol intake to no more than 1 drink per day for nonpregnant women. One drink equals 12 ounces of beer, 5 ounces of wine, or 1 ounces of hard liquor.  Do not use street drugs.  Do not share needles.  Ask your health care provider for help if you need support or information about quitting drugs.  Tell your health care provider if you often feel depressed.  Tell your health care provider if you have ever been abused or do not feel safe at home.   This information is not intended to replace advice given to you by your health care provider. Make sure you discuss any questions you have with your health care provider.   Document Released: 06/27/2011 Document Revised: 01/02/2015 Document Reviewed: 11/13/2013 Elsevier Interactive Patient Education 2016 [place] DASH stands for "Dietary Approaches to Stop Hypertension." The DASH eating plan is a healthy eating plan that has been shown to reduce high blood pressure (hypertension). Additional health benefits may include reducing the risk of type 2 diabetes mellitus, heart disease, and stroke. The DASH eating plan  may also help with weight loss. WHAT DO I NEED TO KNOW ABOUT THE DASH EATING PLAN? For the DASH eating plan, you will follow these general guidelines:  Choose foods with a percent daily value  for sodium of less than 5% (as listed on the food label).  Use salt-free seasonings or herbs instead of table salt or sea salt.  Check with your health care provider or pharmacist before using salt substitutes.  Eat lower-sodium products, often labeled as "lower sodium" or "no salt added."  Eat fresh foods.  Eat more vegetables, fruits, and low-fat dairy products.  Choose whole grains. Look for the word "whole" as the first word in the ingredient list.  Choose fish and skinless chicken or [place] more often than red meat. Limit fish, poultry, and meat to 6 oz (170 g) each day.  Limit sweets, desserts, sugars, and sugary drinks.  Choose heart-healthy fats.  Limit cheese to 1 oz (28 g) per day.  Eat more home-cooked food and less restaurant, buffet, and fast food.  Limit fried foods.  Cook foods using methods other than frying.  Limit canned vegetables. If you do use them, rinse them well to decrease the sodium.  When eating at a restaurant, ask that your food be prepared with less salt, or no salt if possible. WHAT FOODS CAN I EAT? Seek help from a dietitian for individual calorie needs. Grains Whole grain or whole wheat bread. Brown rice. Whole grain or whole wheat pasta. Quinoa, bulgur, and whole grain cereals. Low-sodium cereals. Corn or whole wheat flour tortillas. Whole grain cornbread. Whole grain crackers. Low-sodium crackers. Vegetables Fresh or frozen vegetables (raw, steamed, roasted, or grilled). Low-sodium or reduced-sodium tomato and vegetable juices. Low-sodium or reduced-sodium tomato sauce and paste. Low-sodium or reduced-sodium canned vegetables.  Fruits All fresh, canned (in natural juice), or frozen fruits. Meat and Other Protein Products Ground beef (85% or leaner), grass-fed beef, or beef trimmed of fat. Skinless chicken or [place]. Ground chicken or [place]. Pork trimmed of fat. All fish and seafood. Eggs. Dried beans, peas, or lentils. Unsalted nuts and  seeds. Unsalted canned beans. Dairy Low-fat dairy products, such as skim or 1% milk, 2% or reduced-fat cheeses, low-fat ricotta or cottage cheese, or plain low-fat yogurt. Low-sodium or reduced-sodium cheeses. Fats and Oils Tub margarines without trans fats. Light or reduced-fat mayonnaise and salad dressings (reduced sodium). Avocado. Safflower, olive, or canola oils. Natural peanut or almond butter. Other Unsalted popcorn and pretzels. The items listed above may not be a complete list of recommended foods or beverages. Contact your dietitian for more options. WHAT FOODS ARE NOT RECOMMENDED? Grains White bread. White pasta. White rice. Refined cornbread. Bagels and croissants. Crackers that contain trans fat. Vegetables Creamed or fried vegetables. Vegetables in a cheese sauce. Regular canned vegetables. Regular canned tomato sauce and paste. Regular tomato and vegetable juices. Fruits Dried fruits. Canned fruit in light or heavy syrup. Fruit juice. Meat and Other Protein Products Fatty cuts of meat. Ribs, chicken wings, bacon, sausage, bologna, salami, chitterlings, fatback, hot dogs, bratwurst, and packaged luncheon meats. Salted nuts and seeds. Canned beans with salt. Dairy Whole or 2% milk, cream, half-and-half, and cream cheese. Whole-fat or sweetened yogurt. Full-fat cheeses or blue cheese. Nondairy creamers and whipped toppings. Processed cheese, cheese spreads, or cheese curds. Condiments Onion and garlic salt, seasoned salt, table salt, and sea salt. Canned and packaged gravies. Worcestershire sauce. Tartar sauce. Barbecue sauce. Teriyaki sauce. Soy sauce, including reduced sodium. Steak sauce. Fish sauce. [employer]  sauce. Cocktail sauce. Horseradish. Ketchup and mustard. Meat flavorings and tenderizers. Bouillon cubes. Hot sauce. Tabasco sauce. Marinades. Taco seasonings. Relishes. Fats and Oils Butter, stick margarine, lard, shortening, ghee, and bacon fat. Coconut, palm kernel, or  palm oils. Regular salad dressings. Other Pickles and olives. Salted popcorn and pretzels. The items listed above may not be a complete list of foods and beverages to avoid. Contact your dietitian for more information. WHERE CAN I FIND MORE INFORMATION? National Heart, Lung, and Blood Institute: travelstabloid.com   This information is not intended to replace advice given to you by your health care provider. Make sure you discuss any questions you have with your health care provider.   Document Released: 12/01/2011 Document Revised: 01/02/2015 Document Reviewed: 10/16/2013 Elsevier Interactive Patient Education [employer].

## 2016-08-12 ENCOUNTER — Encounter: Payer: Self-pay | Admitting: Internal Medicine

## 2016-09-07 ENCOUNTER — Encounter: Payer: Self-pay | Admitting: Family Medicine

## 2016-10-21 ENCOUNTER — Ambulatory Visit (INDEPENDENT_AMBULATORY_CARE_PROVIDER_SITE_OTHER): Payer: Medicare Other | Admitting: Family Medicine

## 2016-10-21 DIAGNOSIS — Z23 Encounter for immunization: Secondary | ICD-10-CM | POA: Diagnosis not present

## 2016-11-28 ENCOUNTER — Encounter: Payer: Self-pay | Admitting: Internal Medicine

## 2017-01-24 ENCOUNTER — Telehealth: Payer: Self-pay | Admitting: Internal Medicine

## 2017-01-24 NOTE — Telephone Encounter (Signed)
Pt traveling to [place] and would like a Hep A injection. Would like to know anything else she needs for that trip.  Pt also wants to know if she needs the PNA vaccine. Pt had one in 2013 and 2015, any more?

## 2017-01-26 NOTE — Telephone Encounter (Signed)
Ok to get hepatitis vaccine    If she didn't have hep b vaccine in the past   Can get twin rx      If did get hep b vaccine  In past then hep a vaccine  And booster in 6 months.   Also she what  Is her BP readings  Last visit  Was up . And  Need fu readings to make sure  In healthy range .    Have her check the CDC web site for where she is going to see if any other travel advice

## 2017-01-26 NOTE — Telephone Encounter (Signed)
Left a message for a return call on listed #.

## 2017-01-27 NOTE — Telephone Encounter (Signed)
PT has been sch °

## 2017-01-27 NOTE — Telephone Encounter (Signed)
Left a message for a return call.

## 2017-01-31 ENCOUNTER — Ambulatory Visit (INDEPENDENT_AMBULATORY_CARE_PROVIDER_SITE_OTHER): Payer: Medicare Other | Admitting: *Deleted

## 2017-01-31 DIAGNOSIS — Z23 Encounter for immunization: Secondary | ICD-10-CM

## 2017-04-25 ENCOUNTER — Other Ambulatory Visit: Payer: Self-pay | Admitting: Family Medicine

## 2017-04-25 DIAGNOSIS — E785 Hyperlipidemia, unspecified: Secondary | ICD-10-CM

## 2017-04-25 DIAGNOSIS — E669 Obesity, unspecified: Secondary | ICD-10-CM

## 2017-04-26 ENCOUNTER — Other Ambulatory Visit (INDEPENDENT_AMBULATORY_CARE_PROVIDER_SITE_OTHER): Payer: Medicare Other

## 2017-04-26 DIAGNOSIS — E669 Obesity, unspecified: Secondary | ICD-10-CM | POA: Diagnosis not present

## 2017-04-26 DIAGNOSIS — E785 Hyperlipidemia, unspecified: Secondary | ICD-10-CM

## 2017-04-26 LAB — LIPID PANEL
CHOL/HDL RATIO: 3
Cholesterol: 224 mg/dL — ABNORMAL HIGH (ref 0–200)
HDL: 70.3 mg/dL (ref >39.00)
LDL CALC: 121 mg/dL — AB (ref 0–99)
NONHDL: 153.43
Triglycerides: 163 mg/dL — ABNORMAL HIGH (ref 0.0–149.0)
VLDL: 32.6 mg/dL (ref 0.0–40.0)

## 2017-04-26 LAB — BASIC METABOLIC PANEL
BUN: 13 mg/dL (ref 6–23)
CALCIUM: 9.2 mg/dL (ref 8.4–10.5)
CO2: 27 meq/L (ref 19–32)
Chloride: 107 mEq/L (ref 96–112)
Creatinine, Ser: 0.51 mg/dL (ref 0.40–1.20)
GFR: 126.53 mL/min (ref >60.00)
Glucose, Bld: 87 mg/dL (ref 70–99)
Potassium: 4.3 mEq/L (ref 3.5–5.1)
SODIUM: 140 meq/L (ref 135–145)

## 2017-04-26 LAB — CBC WITH DIFFERENTIAL/PLATELET
BASOS ABS: 0 10*3/uL (ref 0.0–0.1)
Basophils Relative: 0.4 % (ref 0.0–3.0)
Eosinophils Absolute: 0.2 10*3/uL (ref 0.0–0.7)
Eosinophils Relative: 2 % (ref 0.0–5.0)
HEMATOCRIT: 39.6 % (ref 36.0–46.0)
Hemoglobin: 13.3 g/dL (ref 12.0–15.0)
LYMPHS PCT: 24.6 % (ref 12.0–46.0)
Lymphs Abs: 2.1 10*3/uL (ref 0.7–4.0)
MCHC: 33.6 g/dL (ref 30.0–36.0)
MCV: 94.6 fl (ref 78.0–100.0)
MONOS PCT: 4.9 % (ref 3.0–12.0)
Monocytes Absolute: 0.4 10*3/uL (ref 0.1–1.0)
NEUTROS ABS: 5.8 10*3/uL (ref 1.4–7.7)
Neutrophils Relative %: 68.1 % (ref 43.0–77.0)
PLATELETS: 246 10*3/uL (ref 150.0–400.0)
RBC: 4.19 Mil/uL (ref 3.87–5.11)
RDW: 13 % (ref 11.5–15.5)
WBC: 8.5 10*3/uL (ref 4.0–10.5)

## 2017-04-26 LAB — HEPATIC FUNCTION PANEL
ALBUMIN: 3.8 g/dL (ref 3.5–5.2)
ALT: 12 U/L (ref 0–35)
AST: 13 U/L (ref 0–37)
Alkaline Phosphatase: 78 U/L (ref 39–117)
Bilirubin, Direct: 0.2 mg/dL (ref 0.0–0.3)
TOTAL PROTEIN: 6.4 g/dL (ref 6.0–8.3)
Total Bilirubin: 1.1 mg/dL (ref 0.2–1.2)

## 2017-04-26 LAB — TSH: TSH: 1.53 u[IU]/mL (ref 0.35–4.50)

## 2017-05-01 NOTE — Progress Notes (Signed)
Chief Complaint  Patient presents with  . Annual Exam    HPI: Samantha [name] 71 y.o. comes in today for  medicare wellness and  Preventive Medicare wellness exam  .Since last visit. No change in health   Health Maintenance  Topic Date Due  . DEXA SCAN  06/22/2017 (Originally 10/21/2011)  . Hepatitis C Screening  06/22/2017 (Originally [birth year])  . MAMMOGRAM  06/03/2017  . INFLUENZA VACCINE  07/26/2017  . COLONOSCOPY  04/02/2023  . TETANUS/TDAP  10/21/2026  . PNA vac Low Risk Adult  Completed   Health Maintenance Review LIFESTYLE:  TAD  See below  Sugar beverages:n Sleep:7-8 hours    MEDICARE DOCUMENT QUESTIONS  TO SCAN   Hearing: ok  Vision:  No limitations at present . Last eye check UTD  Safety:  Has smoke detector and wears seat belts.  No firearms. No excess sun exposure. Sees dentist regularly.  Falls: n sig   Went travel to [place] and [place] this year  Advance directive :  Reviewed  Has one.  Memory: Felt to be good  , no concern from her or her family.  Depression: No anhedonia unusual crying or depressive symptoms  Nutrition: Eats well balanced diet; adequate calcium and vitamin D. No swallowing chewing problems.  Injury: no major injuries in the last six months.  Other healthcare providers:  Reviewed today .  Social:  Lives alone  Divorced . No pets.   Preventive parameters: up-to-date  Reviewed   ADLS:   There are no problems or need for assistance  driving, feeding, obtaining food, dressing, toileting and bathing, managing money using phone. She is independent.  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 GI /GU: No adominal pain, vomiting, change in  bowel habits. No blood in the stool. No significant GU symptoms. SKIN/HEME: ,no acute skin rashes suspicious lesions or bleeding. No lymphadenopathy, nodules, masses.  NEURO/ PSYCH:  No neurologic signs such as weakness numbness. No depression anxiety. IMM/ Allergy: No unusual infections.  Allergy .   REST of 12 system review negative except as per HPI   Past Medical History:  Diagnosis Date  . Colonic polyp     Family History  Problem Relation Age of Onset  . Heart attack Mother        50  . Hypertension Mother   . Cancer Sister        colon - 77  . Other Daughter        murdered  .  () Unknown     Social History   Social History  . Marital status: Divorced    Spouse name: N/A  . Number of children: N/A  . Years of education: N/A   Social History Main Topics  . Smoking status: Never Smoker  . Smokeless tobacco: Never Used  . Alcohol use 6.0 oz/week    10 Glasses of wine per week  . Drug use: No  . Sexual activity: Not Asked   Other Topics Concern  . 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   No facility-administered encounter medications on file as of 05/03/2017.     EXAM:  BP 134/78 (BP Location: Right Arm)   Temp 98.2 F (36.8 C) (Oral)   Ht 5' 0.25" (1.53 m)   Wt 190 lb (86.2 kg)   BMI 36.80 kg/m   Body mass index is 36.8 kg/m.  Physical Exam: Vital signs reviewed glasses  QVZ:DGLO is a well-developed well-nourished alert cooperative   who appears stated age in no acute distress.  HEENT: normocephalic atraumatic , Eyes: PERRL EOM's full, conjunctiva clear, Nares: paten,t no deformity discharge or tenderness., Ears: no deformity EAC's clear TMs with normal landmarks. Mouth: clear OP, no lesions, edema.  Moist mucous  membranes. Dentition in adequate repair. NECK: supple without masses, thyromegaly or bruits. CHEST/PULM:  Clear to auscultation and percussion breath sounds equal no wheeze , rales or rhonchi. No chest wall deformities or tenderness. CV: PMI is nondisplaced, S1 S2 no gallops, murmurs, rubs. Peripheral pulses are full without delay.No JVD .  Breast: normal by inspection . No dimpling, discharge, masses, tenderness or discharge . ABDOMEN: Bowel sounds normal nontender  No guard or rebound, no hepato splenomegal no CVA tenderness.   Extremtities:  No clubbing cyanosis or edema, no acute joint swelling or redness no focal atrophy NEURO:  Oriented x3, cranial nerves 3-12 appear to be intact, no obvious focal weakness,gait within normal limits no abnormal reflexes or asymmetrical SKIN: No acute rashes normal turgor, color, no bruising or petechiae. PSYCH: Oriented, good eye contact, no obvious depression anxiety, cognition and judgment appear normal. LN: no cervical axillary inguinal adenopathy No noted deficits in memory, attention, and speech.   Lab Results  Component Value Date   WBC 8.5 04/26/2017   HGB 13.3 04/26/2017   HCT 39.6 04/26/2017   PLT 246.0 04/26/2017   GLUCOSE 87 04/26/2017   CHOL 224 (H) 04/26/2017   TRIG 163.0 (H) 04/26/2017   HDL 70.30 04/26/2017   LDLDIRECT 121.2 03/12/2013   LDLCALC 121 (H) 04/26/2017   ALT 12 04/26/2017   AST 13 04/26/2017   NA 140 04/26/2017   K 4.3 04/26/2017   CL 107 04/26/2017   CREATININE 0.51 04/26/2017   BUN 13 04/26/2017   CO2 27 04/26/2017   TSH 1.53 04/26/2017   HGBA1C 4.9 03/12/2013   Labs reviewed with patient ASSESSMENT AND PLAN:  Discussed the following assessment and plan:  Visit for preventive health examination  Hyperlipidemia, unspecified hyperlipidemia type  Medicare annual wellness visit, subsequent  BMI 36.0-36.9,adult Counseled. Diet changes  For weight control  To hel lipids and metabolic parameteres  immuniz  reviewed  rx for shingrix  Patient Care Team: Panosh, Standley Brooking, MD as PCP - General Nahser, Wonda Cheng, MD (Cardiology) Juanita Craver, MD as Attending Physician (Gastroenterology)  Patient Instructions  Attention to healthy eating and activity. Peak 3 things to work on Consider [employer] Can get the new shingles vaccine at a pharmacy under Medicare part D. Checkup and exam in a year or as needed. Can get a bone density when you're ready.   Mediterranean Diet A Mediterranean diet refers to food and lifestyle choices that are based on the traditions of countries located on the [employer]. This way of eating has been shown to help prevent certain conditions  and improve outcomes for people who have chronic diseases, like kidney disease and heart disease. What are tips for following this plan? Lifestyle   Cook and eat meals together with your family, when possible.  Drink enough fluid to keep your urine clear or pale yellow.  Be physically active every day. This includes:  Aerobic exercise like running or swimming.  Leisure activities like gardening, walking, or housework.  Get 7-8 hours of sleep each night.  If recommended by your health care provider, drink red wine in moderation. This means 1 glass a day for nonpregnant women and 2 glasses a day for men. A glass of wine equals 5 oz (150 mL). Reading food labels   Check the serving size of packaged foods. For foods such as rice and pasta, the serving size refers to the amount of cooked product, not dry.  Check the total fat in packaged foods. Avoid foods that have saturated fat or trans fats.  Check the ingredients list for added sugars, such as corn syrup. Shopping   At the grocery store, buy most of your food from the areas near the walls of the store. This includes:  Fresh fruits and vegetables (produce).  Grains, beans, nuts, and seeds. Some of these may be available in unpackaged forms or large amounts (in  bulk).  Fresh seafood.  Poultry and eggs.  Low-fat dairy products.  Buy whole ingredients instead of prepackaged foods.  Buy fresh fruits and vegetables in-season from local farmers markets.  Buy frozen fruits and vegetables in resealable bags.  If you do not have access to quality fresh seafood, buy precooked frozen shrimp or canned fish, such as tuna, salmon, or sardines.  Buy small amounts of raw or cooked vegetables, salads, or olives from the deli or salad bar at your store.  Stock your pantry so you always have certain foods on hand, such as olive oil, canned tuna, canned tomatoes, rice, pasta, and beans. Cooking   Cook foods with extra-virgin olive oil instead of using butter or other vegetable oils.  Have meat as a side dish, and have vegetables or grains as your main dish. This means having meat in small portions or adding small amounts of meat to foods like pasta or stew.  Use beans or vegetables instead of meat in common dishes like chili or lasagna.  Experiment with different cooking methods. Try roasting or broiling vegetables instead of steaming or sauteing them.  Add frozen vegetables to soups, stews, pasta, or rice.  Add nuts or seeds for added healthy fat at each meal. You can add these to yogurt, salads, or vegetable dishes.  Marinate fish or vegetables using olive oil, lemon juice, garlic, and fresh herbs. Meal planning   Plan to eat 1 vegetarian meal one day each week. Try to work up to 2 vegetarian meals, if possible.  Eat seafood 2 or more times a week.  Have healthy snacks readily available, such as:  Vegetable sticks with hummus.  Greek yogurt.  Fruit and nut trail mix.  Eat balanced meals throughout the week. This includes:  Fruit: 2-3 servings a day  Vegetables: 4-5 servings a day  Low-fat dairy: 2 servings a day  Fish, poultry, or lean meat: 1 serving a day  Beans and legumes: 2 or more servings a week  Nuts and seeds: 1-2  servings a day  Whole grains: 6-8 servings a day  Extra-virgin olive oil: 3-4 servings a day  Limit red meat and sweets to only a  few servings a month What are my food choices?  Mediterranean diet  Recommended  Grains: Whole-grain pasta. Brown rice. Bulgar wheat. Polenta. Couscous. Whole-wheat bread. [place].  Vegetables: Artichokes. Beets. Broccoli. Cabbage. Carrots. Eggplant. Green beans. Chard. Kale. Spinach. Onions. Leeks. Peas. Squash. Tomatoes. Peppers. Radishes.  Fruits: Apples. Apricots. Avocado. Berries. Bananas. Cherries. Dates. Figs. Grapes. Lemons. Melon. Oranges. Peaches. Plums. Pomegranate.  Meats and other protein foods: Beans. Almonds. Sunflower seeds. Pine nuts. Peanuts. [place]. Salmon. Scallops. Shrimp. [place]. Tilapia. Clams. Oysters. Eggs.  Dairy: Low-fat milk. Cheese. Greek yogurt.  Beverages: Water. Red wine. Herbal tea.  Fats and oils: Extra virgin olive oil. Avocado oil. Grape seed oil.  Sweets and desserts: [place] yogurt with honey. Baked apples. Poached pears. Trail mix.  Seasoning and other foods: Basil. Cilantro. Coriander. Cumin. Mint. Parsley. Sage. Rosemary. Tarragon. Garlic. Oregano. Thyme. Pepper. Balsalmic vinegar. Tahini. Hummus. Tomato sauce. Olives. Mushrooms.  Limit these  Grains: Prepackaged pasta or rice dishes. Prepackaged cereal with added sugar.  Vegetables: Deep fried potatoes (french fries).  Fruits: Fruit canned in syrup.  Meats and other protein foods: Beef. Pork. Lamb. Poultry with skin. Hot dogs. [place] Salines.  Dairy: Ice cream. Sour cream. Whole milk.  Beverages: Juice. Sugar-sweetened soft drinks. Beer. Liquor and spirits.  Fats and oils: Butter. Canola oil. Vegetable oil. Beef fat (tallow). Lard.  Sweets and desserts: Cookies. Cakes. Pies. Candy.  Seasoning and other foods: Mayonnaise. Premade sauces and marinades.  The items listed may not be a complete list. Talk with your dietitian about what dietary choices are  right for you. Summary  The Mediterranean diet includes both food and lifestyle choices.  Eat a variety of fresh fruits and vegetables, beans, nuts, seeds, and whole grains.  Limit the amount of red meat and sweets that you eat.  Talk with your health care provider about whether it is safe for you to drink red wine in moderation. This means 1 glass a day for nonpregnant women and 2 glasses a day for men. A glass of wine equals 5 oz (150 mL). This information is not intended to replace advice given to you by your health care provider. Make sure you discuss any questions you have with your health care provider. Document Released: 08/04/2016 Document Revised: 09/06/2016 Document Reviewed: 08/04/2016 Elsevier Interactive Patient Education  2017 [place] Maintenance, Female Adopting a healthy lifestyle and getting preventive care can go a long way to promote health and wellness. Talk with your health care provider about what schedule of regular examinations is right for you. This is a good chance for you to check in with your provider about disease prevention and staying healthy. In between checkups, there are plenty of things you can do on your own. Experts have done a lot of research about which lifestyle changes and preventive measures are most likely to keep you healthy. Ask your health care provider for more information. Weight and diet Eat a healthy diet  Be sure to include plenty of vegetables, fruits, low-fat dairy products, and lean protein.  Do not eat a lot of foods high in solid fats, added sugars, or salt.  Get regular exercise. This is one of the most important things you can do for your health.  Most adults should exercise for at least 150 minutes each week. The exercise should increase your heart rate and make you sweat (moderate-intensity exercise).  Most adults should also do strengthening exercises at least twice a week. This is in addition to the  moderate-intensity exercise. Maintain a healthy weight  Body mass index (BMI) is a measurement that can be used to identify possible weight problems. It estimates body fat based on height and weight. Your health care provider can help determine your BMI and help you achieve or maintain a healthy weight.  For females 49 years of age and older:  A BMI below 18.5 is considered underweight.  A BMI of 18.5 to 24.9 is normal.  A BMI of 25 to 29.9 is considered overweight.  A BMI of 30 and above is considered obese. Watch levels of cholesterol and blood lipids  You should start having your blood tested for lipids and cholesterol at 71 years of age, then have this test every 5 years.  You may need to have your cholesterol levels checked more often if:  Your lipid or cholesterol levels are high.  You are older than 71 years of age.  You are at high risk for heart disease. Cancer screening Lung Cancer  Lung cancer screening is recommended for adults 26-75 years old who are at high risk for lung cancer because of a history of smoking.  A yearly low-dose CT scan of the lungs is recommended for people who:  Currently smoke.  Have quit within the past 15 years.  Have at least a 30-pack-year history of smoking. A pack year is smoking an average of one pack of cigarettes a day for 1 year.  Yearly screening should continue until it has been 15 years since you quit.  Yearly screening should stop if you develop a health problem that would prevent you from having lung cancer treatment. Breast Cancer  Practice breast self-awareness. This means understanding how your breasts normally appear and feel.  It also means doing regular breast self-exams. Let your health care provider know about any changes, no matter how small.  If you are in your 20s or 30s, you should have a clinical breast exam (CBE) by a health care provider every 1-3 years as part of a regular health exam.  If you are 87 or  older, have a CBE every year. Also consider having a breast X-ray (mammogram) every year.  If you have a family history of breast cancer, talk to your health care provider about genetic screening.  If you are at high risk for breast cancer, talk to your health care provider about having an MRI and a mammogram every year.  Breast cancer gene (BRCA) assessment is recommended for women who have family members with BRCA-related cancers. BRCA-related cancers include:  Breast.  Ovarian.  Tubal.  Peritoneal cancers.  Results of the assessment will determine the need for genetic counseling and BRCA1 and BRCA2 testing. Cervical Cancer  Your health care provider may recommend that you be screened regularly for cancer of the pelvic organs (ovaries, uterus, and vagina). This screening involves a pelvic examination, including checking for microscopic changes to the surface of your cervix (Pap test). You may be encouraged to have this screening done every 3 years, beginning at age 50.  For women ages 74-65, health care providers may recommend pelvic exams and Pap testing every 3 years, or they may recommend the Pap and pelvic exam, combined with testing for human papilloma virus (HPV), every 5 years. Some types of HPV increase your risk of cervical cancer. Testing for HPV may also be done on women of any age with unclear Pap test results.  Other health care providers may not recommend any screening for nonpregnant women who are  considered low risk for pelvic cancer and who do not have symptoms. Ask your health care provider if a screening pelvic exam is right for you.  If you have had past treatment for cervical cancer or a condition that could lead to cancer, you need Pap tests and screening for cancer for at least 20 years after your treatment. If Pap tests have been discontinued, your risk factors (such as having a new sexual partner) need to be reassessed to determine if screening should resume. Some  women have medical problems that increase the chance of getting cervical cancer. In these cases, your health care provider may recommend more frequent screening and Pap tests. Colorectal Cancer  This type of cancer can be detected and often prevented.  Routine colorectal cancer screening usually begins at 71 years of age and continues through 71 years of age.  Your health care provider may recommend screening at an earlier age if you have risk factors for colon cancer.  Your health care provider may also recommend using home test kits to check for hidden blood in the stool.  A small camera at the end of a tube can be used to examine your colon directly (sigmoidoscopy or colonoscopy). This is done to check for the earliest forms of colorectal cancer.  Routine screening usually begins at age 20.  Direct examination of the colon should be repeated every 5-10 years through 71 years of age. However, you may need to be screened more often if early forms of precancerous polyps or small growths are found. Skin Cancer  Check your skin from head to toe regularly.  Tell your health care provider about any new moles or changes in moles, especially if there is a change in a mole's shape or color.  Also tell your health care provider if you have a mole that is larger than the size of a pencil eraser.  Always use sunscreen. Apply sunscreen liberally and repeatedly throughout the day.  Protect yourself by wearing long sleeves, pants, a wide-brimmed hat, and sunglasses whenever you are outside. Heart disease, diabetes, and high blood pressure  High blood pressure causes heart disease and increases the risk of stroke. High blood pressure is more likely to develop in:  People who have blood pressure in the high end of the normal range (130-139/85-89 mm Hg).  People who are overweight or obese.  People who are African American.  If you are 83-44 years of age, have your blood pressure checked every  3-5 years. If you are 32 years of age or older, have your blood pressure checked every year. You should have your blood pressure measured twice-once when you are at a hospital or clinic, and once when you are not at a hospital or clinic. Record the average of the two measurements. To check your blood pressure when you are not at a hospital or clinic, you can use:  An automated blood pressure machine at a pharmacy.  A home blood pressure monitor.  If you are between 89 years and 11 years old, ask your health care provider if you should take aspirin to prevent strokes.  Have regular diabetes screenings. This involves taking a blood sample to check your fasting blood sugar level.  If you are at a normal weight and have a low risk for diabetes, have this test once every three years after 71 years of age.  If you are overweight and have a high risk for diabetes, consider being tested at a younger age or  more often. Preventing infection Hepatitis B  If you have a higher risk for hepatitis B, you should be screened for this virus. You are considered at high risk for hepatitis B if:  You were born in a country where hepatitis B is common. Ask your health care provider which countries are considered high risk.  Your parents were born in a high-risk country, and you have not been immunized against hepatitis B (hepatitis B vaccine).  You have HIV or AIDS.  You use needles to inject street drugs.  You live with someone who has hepatitis B.  You have had sex with someone who has hepatitis B.  You get hemodialysis treatment.  You take certain medicines for conditions, including cancer, organ transplantation, and autoimmune conditions. Hepatitis C  Blood testing is recommended for:  Everyone born from [birth year] through 1965.  Anyone with known risk factors for hepatitis C. Sexually transmitted infections (STIs)  You should be screened for sexually transmitted infections (STIs) including  gonorrhea and chlamydia if:  You are sexually active and are younger than 71 years of age.  You are older than 71 years of age and your health care provider tells you that you are at risk for this type of infection.  Your sexual activity has changed since you were last screened and you are at an increased risk for chlamydia or gonorrhea. Ask your health care provider if you are at risk.  If you do not have HIV, but are at risk, it may be recommended that you take a prescription medicine daily to prevent HIV infection. This is called pre-exposure prophylaxis (PrEP). You are considered at risk if:  You are sexually active and do not regularly use condoms or know the HIV status of your partner(s).  You take drugs by injection.  You are sexually active with a partner who has HIV. Talk with your health care provider about whether you are at high risk of being infected with HIV. If you choose to begin PrEP, you should first be tested for HIV. You should then be tested every 3 months for as long as you are taking PrEP. Pregnancy  If you are premenopausal and you may become pregnant, ask your health care provider about preconception counseling.  If you may become pregnant, take 400 to 800 micrograms (mcg) of folic acid every day.  If you want to prevent pregnancy, talk to your health care provider about birth control (contraception). Osteoporosis and menopause  Osteoporosis is a disease in which the bones lose minerals and strength with aging. This can result in serious bone fractures. Your risk for osteoporosis can be identified using a bone density scan.  If you are 1 years of age or older, or if you are at risk for osteoporosis and fractures, ask your health care provider if you should be screened.  Ask your health care provider whether you should take a calcium or vitamin D supplement to lower your risk for osteoporosis.  Menopause may have certain physical symptoms and risks.  Hormone  replacement therapy may reduce some of these symptoms and risks. Talk to your health care provider about whether hormone replacement therapy is right for you. Follow these instructions at home:  Schedule regular health, dental, and eye exams.  Stay current with your immunizations.  Do not use any tobacco products including cigarettes, chewing tobacco, or electronic cigarettes.  If you are pregnant, do not drink alcohol.  If you are breastfeeding, limit how much and how often you drink  alcohol.  Limit alcohol intake to no more than 1 drink per day for nonpregnant women. One drink equals 12 ounces of beer, 5 ounces of wine, or 1 ounces of hard liquor.  Do not use street drugs.  Do not share needles.  Ask your health care provider for help if you need support or information about quitting drugs.  Tell your health care provider if you often feel depressed.  Tell your health care provider if you have ever been abused or do not feel safe at home. This information is not intended to replace advice given to you by your health care provider. Make sure you discuss any questions you have with your health care provider. Document Released: 06/27/2011 Document Revised: 05/19/2016 Document Reviewed: 09/15/2015 Elsevier Interactive Patient Education  2017 [place] K. Panosh M.D.

## 2017-05-03 ENCOUNTER — Ambulatory Visit (INDEPENDENT_AMBULATORY_CARE_PROVIDER_SITE_OTHER): Payer: Medicare Other | Admitting: Internal Medicine

## 2017-05-03 ENCOUNTER — Encounter: Payer: Self-pay | Admitting: Internal Medicine

## 2017-05-03 VITALS — BP 134/78 | Temp 98.2°F | Ht 60.25 in | Wt 190.0 lb

## 2017-05-03 DIAGNOSIS — Z6836 Body mass index (BMI) 36.0-36.9, adult: Secondary | ICD-10-CM

## 2017-05-03 DIAGNOSIS — E785 Hyperlipidemia, unspecified: Secondary | ICD-10-CM

## 2017-05-03 DIAGNOSIS — Z0000 Encounter for general adult medical examination without abnormal findings: Secondary | ICD-10-CM

## 2017-05-03 NOTE — Patient Instructions (Signed)
Attention to healthy eating and activity. Peak 3 things to work on Consider [employer] Can get the new shingles vaccine at a pharmacy under Medicare part D. Checkup and exam in a year or as needed. Can get a bone density when you're ready.   Mediterranean Diet A Mediterranean diet refers to food and lifestyle choices that are based on the traditions of countries located on the [employer]. This way of eating has been shown to help prevent certain conditions and improve outcomes for people who have chronic diseases, like kidney disease and heart disease. What are tips for following this plan? Lifestyle   Cook and eat meals together with your family, when possible.  Drink enough fluid to keep your urine clear or pale yellow.  Be physically active every day. This includes:  Aerobic exercise like running or swimming.  Leisure activities like gardening, walking, or housework.  Get 7-8 hours of sleep each night.  If recommended by your health care provider, drink red wine in moderation. This means 1 glass a day for nonpregnant women and 2 glasses a day for men. A glass of wine equals 5 oz (150 mL). Reading food labels   Check the serving size of packaged foods. For foods such as rice and pasta, the serving size refers to the amount of cooked product, not dry.  Check the total fat in packaged foods. Avoid foods that have saturated fat or trans fats.  Check the ingredients list for added sugars, such as corn syrup. Shopping   At the grocery store, buy most of your food from the areas near the walls of the store. This includes:  Fresh fruits and vegetables (produce).  Grains, beans, nuts, and seeds. Some of these may be available in unpackaged forms or large amounts (in bulk).  Fresh seafood.  Poultry and eggs.  Low-fat dairy products.  Buy whole ingredients instead of prepackaged foods.  Buy fresh fruits and vegetables in-season from local farmers markets.  Buy  frozen fruits and vegetables in resealable bags.  If you do not have access to quality fresh seafood, buy precooked frozen shrimp or canned fish, such as tuna, salmon, or sardines.  Buy small amounts of raw or cooked vegetables, salads, or olives from the deli or salad bar at your store.  Stock your pantry so you always have certain foods on hand, such as olive oil, canned tuna, canned tomatoes, rice, pasta, and beans. Cooking   Cook foods with extra-virgin olive oil instead of using butter or other vegetable oils.  Have meat as a side dish, and have vegetables or grains as your main dish. This means having meat in small portions or adding small amounts of meat to foods like pasta or stew.  Use beans or vegetables instead of meat in common dishes like chili or lasagna.  Experiment with different cooking methods. Try roasting or broiling vegetables instead of steaming or sauteing them.  Add frozen vegetables to soups, stews, pasta, or rice.  Add nuts or seeds for added healthy fat at each meal. You can add these to yogurt, salads, or vegetable dishes.  Marinate fish or vegetables using olive oil, lemon juice, garlic, and fresh herbs. Meal planning   Plan to eat 1 vegetarian meal one day each week. Try to work up to 2 vegetarian meals, if possible.  Eat seafood 2 or more times a week.  Have healthy snacks readily available, such as:  Vegetable sticks with hummus.  Greek yogurt.  Fruit and nut  trail mix.  Eat balanced meals throughout the week. This includes:  Fruit: 2-3 servings a day  Vegetables: 4-5 servings a day  Low-fat dairy: 2 servings a day  Fish, poultry, or lean meat: 1 serving a day  Beans and legumes: 2 or more servings a week  Nuts and seeds: 1-2 servings a day  Whole grains: 6-8 servings a day  Extra-virgin olive oil: 3-4 servings a day  Limit red meat and sweets to only a few servings a month What are my food choices?  Mediterranean  diet  Recommended  Grains: Whole-grain pasta. Brown rice. Bulgar wheat. Polenta. Couscous. Whole-wheat bread. [place].  Vegetables: Artichokes. Beets. Broccoli. Cabbage. Carrots. Eggplant. Green beans. Chard. Kale. Spinach. Onions. Leeks. Peas. Squash. Tomatoes. Peppers. Radishes.  Fruits: Apples. Apricots. Avocado. Berries. Bananas. Cherries. Dates. Figs. Grapes. Lemons. Melon. Oranges. Peaches. Plums. Pomegranate.  Meats and other protein foods: Beans. Almonds. Sunflower seeds. Pine nuts. Peanuts. [place]. Salmon. Scallops. Shrimp. [place]. Tilapia. Clams. Oysters. Eggs.  Dairy: Low-fat milk. Cheese. Greek yogurt.  Beverages: Water. Red wine. Herbal tea.  Fats and oils: Extra virgin olive oil. Avocado oil. Grape seed oil.  Sweets and desserts: [place] yogurt with honey. Baked apples. Poached pears. Trail mix.  Seasoning and other foods: Basil. Cilantro. Coriander. Cumin. Mint. Parsley. Sage. Rosemary. Tarragon. Garlic. Oregano. Thyme. Pepper. Balsalmic vinegar. Tahini. Hummus. Tomato sauce. Olives. Mushrooms.  Limit these  Grains: Prepackaged pasta or rice dishes. Prepackaged cereal with added sugar.  Vegetables: Deep fried potatoes (french fries).  Fruits: Fruit canned in syrup.  Meats and other protein foods: Beef. Pork. Lamb. Poultry with skin. Hot dogs. [place] Salines.  Dairy: Ice cream. Sour cream. Whole milk.  Beverages: Juice. Sugar-sweetened soft drinks. Beer. Liquor and spirits.  Fats and oils: Butter. Canola oil. Vegetable oil. Beef fat (tallow). Lard.  Sweets and desserts: Cookies. Cakes. Pies. Candy.  Seasoning and other foods: Mayonnaise. Premade sauces and marinades.  The items listed may not be a complete list. Talk with your dietitian about what dietary choices are right for you. Summary  The Mediterranean diet includes both food and lifestyle choices.  Eat a variety of fresh fruits and vegetables, beans, nuts, seeds, and whole grains.  Limit the amount of red  meat and sweets that you eat.  Talk with your health care provider about whether it is safe for you to drink red wine in moderation. This means 1 glass a day for nonpregnant women and 2 glasses a day for men. A glass of wine equals 5 oz (150 mL). This information is not intended to replace advice given to you by your health care provider. Make sure you discuss any questions you have with your health care provider. Document Released: 08/04/2016 Document Revised: 09/06/2016 Document Reviewed: 08/04/2016 Elsevier Interactive Patient Education  2017 [place] Maintenance, Female Adopting a healthy lifestyle and getting preventive care can go a long way to promote health and wellness. Talk with your health care provider about what schedule of regular examinations is right for you. This is a good chance for you to check in with your provider about disease prevention and staying healthy. In between checkups, there are plenty of things you can do on your own. Experts have done a lot of research about which lifestyle changes and preventive measures are most likely to keep you healthy. Ask your health care provider for more information. Weight and diet Eat a healthy diet  Be sure to include plenty of vegetables, fruits, low-fat  dairy products, and lean protein.  Do not eat a lot of foods high in solid fats, added sugars, or salt.  Get regular exercise. This is one of the most important things you can do for your health.  Most adults should exercise for at least 150 minutes each week. The exercise should increase your heart rate and make you sweat (moderate-intensity exercise).  Most adults should also do strengthening exercises at least twice a week. This is in addition to the moderate-intensity exercise. Maintain a healthy weight  Body mass index (BMI) is a measurement that can be used to identify possible weight problems. It estimates body fat based on height and weight. Your health  care provider can help determine your BMI and help you achieve or maintain a healthy weight.  For females 26 years of age and older:  A BMI below 18.5 is considered underweight.  A BMI of 18.5 to 24.9 is normal.  A BMI of 25 to 29.9 is considered overweight.  A BMI of 30 and above is considered obese. Watch levels of cholesterol and blood lipids  You should start having your blood tested for lipids and cholesterol at 71 years of age, then have this test every 5 years.  You may need to have your cholesterol levels checked more often if:  Your lipid or cholesterol levels are high.  You are older than 71 years of age.  You are at high risk for heart disease. Cancer screening Lung Cancer  Lung cancer screening is recommended for adults 75-8 years old who are at high risk for lung cancer because of a history of smoking.  A yearly low-dose CT scan of the lungs is recommended for people who:  Currently smoke.  Have quit within the past 15 years.  Have at least a 30-pack-year history of smoking. A pack year is smoking an average of one pack of cigarettes a day for 1 year.  Yearly screening should continue until it has been 15 years since you quit.  Yearly screening should stop if you develop a health problem that would prevent you from having lung cancer treatment. Breast Cancer  Practice breast self-awareness. This means understanding how your breasts normally appear and feel.  It also means doing regular breast self-exams. Let your health care provider know about any changes, no matter how small.  If you are in your 20s or 30s, you should have a clinical breast exam (CBE) by a health care provider every 1-3 years as part of a regular health exam.  If you are 30 or older, have a CBE every year. Also consider having a breast X-ray (mammogram) every year.  If you have a family history of breast cancer, talk to your health care provider about genetic screening.  If you are at  high risk for breast cancer, talk to your health care provider about having an MRI and a mammogram every year.  Breast cancer gene (BRCA) assessment is recommended for women who have family members with BRCA-related cancers. BRCA-related cancers include:  Breast.  Ovarian.  Tubal.  Peritoneal cancers.  Results of the assessment will determine the need for genetic counseling and BRCA1 and BRCA2 testing. Cervical Cancer  Your health care provider may recommend that you be screened regularly for cancer of the pelvic organs (ovaries, uterus, and vagina). This screening involves a pelvic examination, including checking for microscopic changes to the surface of your cervix (Pap test). You may be encouraged to have this screening done every 3 years,  beginning at age 74.  For women ages 53-65, health care providers may recommend pelvic exams and Pap testing every 3 years, or they may recommend the Pap and pelvic exam, combined with testing for human papilloma virus (HPV), every 5 years. Some types of HPV increase your risk of cervical cancer. Testing for HPV may also be done on women of any age with unclear Pap test results.  Other health care providers may not recommend any screening for nonpregnant women who are considered low risk for pelvic cancer and who do not have symptoms. Ask your health care provider if a screening pelvic exam is right for you.  If you have had past treatment for cervical cancer or a condition that could lead to cancer, you need Pap tests and screening for cancer for at least 20 years after your treatment. If Pap tests have been discontinued, your risk factors (such as having a new sexual partner) need to be reassessed to determine if screening should resume. Some women have medical problems that increase the chance of getting cervical cancer. In these cases, your health care provider may recommend more frequent screening and Pap tests. Colorectal Cancer  This type of cancer  can be detected and often prevented.  Routine colorectal cancer screening usually begins at 71 years of age and continues through 71 years of age.  Your health care provider may recommend screening at an earlier age if you have risk factors for colon cancer.  Your health care provider may also recommend using home test kits to check for hidden blood in the stool.  A small camera at the end of a tube can be used to examine your colon directly (sigmoidoscopy or colonoscopy). This is done to check for the earliest forms of colorectal cancer.  Routine screening usually begins at age 46.  Direct examination of the colon should be repeated every 5-10 years through 71 years of age. However, you may need to be screened more often if early forms of precancerous polyps or small growths are found. Skin Cancer  Check your skin from head to toe regularly.  Tell your health care provider about any new moles or changes in moles, especially if there is a change in a mole's shape or color.  Also tell your health care provider if you have a mole that is larger than the size of a pencil eraser.  Always use sunscreen. Apply sunscreen liberally and repeatedly throughout the day.  Protect yourself by wearing long sleeves, pants, a wide-brimmed hat, and sunglasses whenever you are outside. Heart disease, diabetes, and high blood pressure  High blood pressure causes heart disease and increases the risk of stroke. High blood pressure is more likely to develop in:  People who have blood pressure in the high end of the normal range (130-139/85-89 mm Hg).  People who are overweight or obese.  People who are African American.  If you are 60-29 years of age, have your blood pressure checked every 3-5 years. If you are 66 years of age or older, have your blood pressure checked every year. You should have your blood pressure measured twice-once when you are at a hospital or clinic, and once when you are not at a  hospital or clinic. Record the average of the two measurements. To check your blood pressure when you are not at a hospital or clinic, you can use:  An automated blood pressure machine at a pharmacy.  A home blood pressure monitor.  If you are between  34 years and 53 years old, ask your health care provider if you should take aspirin to prevent strokes.  Have regular diabetes screenings. This involves taking a blood sample to check your fasting blood sugar level.  If you are at a normal weight and have a low risk for diabetes, have this test once every three years after 71 years of age.  If you are overweight and have a high risk for diabetes, consider being tested at a younger age or more often. Preventing infection Hepatitis B  If you have a higher risk for hepatitis B, you should be screened for this virus. You are considered at high risk for hepatitis B if:  You were born in a country where hepatitis B is common. Ask your health care provider which countries are considered high risk.  Your parents were born in a high-risk country, and you have not been immunized against hepatitis B (hepatitis B vaccine).  You have HIV or AIDS.  You use needles to inject street drugs.  You live with someone who has hepatitis B.  You have had sex with someone who has hepatitis B.  You get hemodialysis treatment.  You take certain medicines for conditions, including cancer, organ transplantation, and autoimmune conditions. Hepatitis C  Blood testing is recommended for:  Everyone born from [birth year] through 1965.  Anyone with known risk factors for hepatitis C. Sexually transmitted infections (STIs)  You should be screened for sexually transmitted infections (STIs) including gonorrhea and chlamydia if:  You are sexually active and are younger than 71 years of age.  You are older than 71 years of age and your health care provider tells you that you are at risk for this type of  infection.  Your sexual activity has changed since you were last screened and you are at an increased risk for chlamydia or gonorrhea. Ask your health care provider if you are at risk.  If you do not have HIV, but are at risk, it may be recommended that you take a prescription medicine daily to prevent HIV infection. This is called pre-exposure prophylaxis (PrEP). You are considered at risk if:  You are sexually active and do not regularly use condoms or know the HIV status of your partner(s).  You take drugs by injection.  You are sexually active with a partner who has HIV. Talk with your health care provider about whether you are at high risk of being infected with HIV. If you choose to begin PrEP, you should first be tested for HIV. You should then be tested every 3 months for as long as you are taking PrEP. Pregnancy  If you are premenopausal and you may become pregnant, ask your health care provider about preconception counseling.  If you may become pregnant, take 400 to 800 micrograms (mcg) of folic acid every day.  If you want to prevent pregnancy, talk to your health care provider about birth control (contraception). Osteoporosis and menopause  Osteoporosis is a disease in which the bones lose minerals and strength with aging. This can result in serious bone fractures. Your risk for osteoporosis can be identified using a bone density scan.  If you are 65 years of age or older, or if you are at risk for osteoporosis and fractures, ask your health care provider if you should be screened.  Ask your health care provider whether you should take a calcium or vitamin D supplement to lower your risk for osteoporosis.  Menopause may have certain physical symptoms and  risks.  Hormone replacement therapy may reduce some of these symptoms and risks. Talk to your health care provider about whether hormone replacement therapy is right for you. Follow these instructions at home:  Schedule  regular health, dental, and eye exams.  Stay current with your immunizations.  Do not use any tobacco products including cigarettes, chewing tobacco, or electronic cigarettes.  If you are pregnant, do not drink alcohol.  If you are breastfeeding, limit how much and how often you drink alcohol.  Limit alcohol intake to no more than 1 drink per day for nonpregnant women. One drink equals 12 ounces of beer, 5 ounces of wine, or 1 ounces of hard liquor.  Do not use street drugs.  Do not share needles.  Ask your health care provider for help if you need support or information about quitting drugs.  Tell your health care provider if you often feel depressed.  Tell your health care provider if you have ever been abused or do not feel safe at home. This information is not intended to replace advice given to you by your health care provider. Make sure you discuss any questions you have with your health care provider. Document Released: 06/27/2011 Document Revised: 05/19/2016 Document Reviewed: 09/15/2015 Elsevier Interactive Patient Education  2017 [employer].

## 2017-05-04 ENCOUNTER — Encounter: Payer: Self-pay | Admitting: Internal Medicine

## 2017-09-15 ENCOUNTER — Encounter: Payer: Self-pay | Admitting: Internal Medicine

## 2018-05-14 NOTE — Progress Notes (Signed)
Chief Complaint  Patient presents with  . Annual Exam    No new concerns.     HPI: Samantha [name] 72 y.o. comes in today for Preventive Medicare exam/ wellness visit .Since last visit.  Doing ok no major changes  Working part time  And travels .   Health Maintenance  Topic Date Due  . Hepatitis C Screening  September 24, 1946  . DEXA SCAN  10/21/2011  . MAMMOGRAM  06/03/2017  . INFLUENZA VACCINE  07/26/2018  . COLONOSCOPY  04/02/2023  . TETANUS/TDAP  10/21/2026  . PNA vac Low Risk Adult  Completed   Health Maintenance Review LIFESTYLE:  Exercise:    Some now    Rowing machine 2-3 x per week  .   Aches and pain.  Tobacco/ETS: no Alcohol:   2-3-4    .   Per day  Hot tea  Could be substituted  Sugar beverages:   no Sleep:     Interrupted doing.  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     Hearing:   Not well   At times  .  Vision:  No limitations at present . Last eye check UTD   Safety:  Has smoke detector and wears seat belts.   No excess sun exposure. Sees dentist regularly.  Falls:  no  Memory: Felt to be good  , no concern from her or her family.  Depression: No anhedonia unusual crying or depressive symptoms  What to expect for situation   Nutrition: Eats well balanced diet; adequate calcium and vitamin D. No swallowing chewing problems.  Injury: no major injuries in the last six months.  Other healthcare providers:  Reviewed today    Preventive parameters:   Reviewed   ADLS:   There are no problems or need for assistance  driving, feeding, obtaining food, dressing, toileting and bathing, managing money using phone. She is independent  ROS:  GEN/ HEENT: No fever, significant weight changes sweats headaches vision problems hearing changes, CV/ PULM; No chest pain shortness of breath cough, syncope,edema  change in exercise tolerance. GI /GU: No adominal pain, vomiting, change in bowel  habits. No blood in the stool. No significant GU symptoms. SKIN/HEME: ,no acute skin rashes suspicious lesions or bleeding. No lymphadenopathy, nodules, masses.  NEURO/ PSYCH:  No neurologic signs such as weakness numbness. No depression anxiety. IMM/ Allergy: No unusual infections.  Allergy .   REST of 12 system review negative except as per HPI   Past Medical History:  Diagnosis Date  . Colonic polyp     Family History  Problem Relation Age of Onset  . Heart attack Mother        34  . Hypertension Mother   . Cancer Sister        colon - 35  . Other Daughter        murdered  .  () Unknown     Social History   Socioeconomic History  . Marital status: Divorced    Spouse name: Not on file  . Number of children: Not on file  . Years of education: Not on file  . Highest education level: Not on file  Occupational History  . Not on file  Social Needs  . Financial resource strain: Not on file  . Food insecurity:    Worry: Not  on file    Inability: Not on file  . Transportation needs:    Medical: Not on file    Non-medical: Not on file  Tobacco Use  . Smoking status: Never Smoker  . Smokeless tobacco: Never Used  Substance and Sexual Activity  . Alcohol use: Yes    Alcohol/week: 6.0 oz    Types: 10 Glasses of wine per week  . Drug use: No  . Sexual activity: Not on file  Lifestyle  . Physical activity:    Days per week: Not on file    Minutes per session: Not on file  . Stress: Not on file  Relationships  . Social connections:    Talks on phone: Not on file    Gets together: Not on file    Attends religious service: Not on file    Active member of club or organization: Not on file    Attends meetings of clubs or organizations: Not on file    Relationship status: Not on file  Other Topics Concern  . 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   No facility-administered encounter medications on file as of 05/15/2018.     EXAM:  BP 110/74 (BP Location: Right Arm, Patient Position: Sitting, Cuff Size: Normal)   Pulse 75   Temp 98 F (36.7 C) (Oral)   Ht 5' (1.524 m)   Wt 185 lb 3.2 oz (84 kg)   BMI 36.17 kg/m   Body mass index is 36.17 kg/m.  Physical Exam: Vital signs reviewed CLE:XNTZ is a well-developed well-nourished alert cooperative   who appears stated age in no acute distress.  HEENT: normocephalic atraumatic , Eyes: PERRL EOM's full, conjunctiva clear, Nares: paten,t no deformity discharge or tenderness., Ears: no deformity EAC's clear TMs with normal landmarks. Mouth: clear OP, no lesions, edema.  Moist mucous membranes. Dentition in adequate repair. NECK: supple without masses, thyromegaly or bruits. CHEST/PULM:  Clear to auscultation and percussion breath sounds equal no wheeze , rales or rhonchi. No chest wall deformities or tenderness.Breast: normal by inspection . No dimpling, discharge, masses, tenderness or discharge . CV: PMI is nondisplaced, S1 S2 no gallops, murmurs, rubs. Peripheral pulses are full without delay.No JVD .  ABDOMEN: Bowel sounds normal nontender  No guard or rebound, no hepato splenomegal no CVA tenderness.   Extremtities:  No clubbing cyanosis or edema, no acute joint swelling or redness no focal atrophy few vv  No ulcers  Nl pulses  NEURO:  Oriented x3, cranial nerves 3-12 appear to be intact, no obvious focal weakness,gait within normal limits no abnormal reflexes or asymmetrical SKIN: No acute rashes normal turgor, color, no bruising or petechiae. PSYCH: Oriented, good eye contact, no obvious depression anxiety, cognition and judgment appear normal. LN: no cervical axillary inguinal adenopathy No noted deficits in memory, attention, and speech.   Lab Results    Component Value Date   WBC 8.5 04/26/2017   HGB 13.3 04/26/2017   HCT 39.6 04/26/2017   PLT 246.0 04/26/2017   GLUCOSE 86 05/15/2018   CHOL 227 (H) 05/15/2018   TRIG 214.0 (H) 05/15/2018   HDL 66.00 05/15/2018   LDLDIRECT 136.0  05/15/2018   LDLCALC 121 (H) 04/26/2017   ALT 12 05/15/2018   AST 15 05/15/2018   NA 139 05/15/2018   K 4.2 05/15/2018   CL 104 05/15/2018   CREATININE 0.66 05/15/2018   BUN 11 05/15/2018   CO2 28 05/15/2018   TSH 1.53 04/26/2017   HGBA1C 4.9 03/12/2013   Lab done after lunch  PP ASSESSMENT AND PLAN:  Discussed the following assessment and plan:  Visit for preventive health examination  Medication management - Plan: Lipid panel, CMP  Hyperlipidemia, unspecified hyperlipidemia type - Plan: Lipid panel, CMP  BMI 36.0-36.9,adult - Plan: Lipid panel, CMP fam hx colon cancer   Disc Mammo   To get    Excessive etoh use but habit u healthy  no other signs disc  Limiting   May help her lose weight Counseled. Strategies  Patient Care Team: Panosh, Standley Brooking, MD as PCP - General Nahser, Wonda Cheng, MD (Cardiology) Juanita Craver, MD as Attending Physician (Gastroenterology)  Patient Instructions   Get your mammogram.     Continue lifestyle intervention healthy eating and exercise . And healty weight loss and decrease the    Alcohol  Intake .        Health Maintenance, Female Adopting a healthy lifestyle and getting preventive care can go a long way to promote health and wellness. Talk with your health care provider about what schedule of regular examinations is right for you. This is a good chance for you to check in with your provider about disease prevention and staying healthy. In between checkups, there are plenty of things you can do on your own. Experts have done a lot of research about which lifestyle changes and preventive measures are most likely to keep you healthy. Ask your health care provider for more information. Weight and diet Eat a  healthy diet  Be sure to include plenty of vegetables, fruits, low-fat dairy products, and lean protein.  Do not eat a lot of foods high in solid fats, added sugars, or salt.  Get regular exercise. This is one of the most important things you can do for your health. ? Most adults should exercise for at least 150 minutes each week. The exercise should increase your heart rate and make you sweat (moderate-intensity exercise). ? Most adults should also do strengthening exercises at least twice a week. This is in addition to the moderate-intensity exercise.  Maintain a healthy weight  Body mass index (BMI) is a measurement that can be used to identify possible weight problems. It estimates body fat based on height and weight. Your health care provider can help determine your BMI and help you achieve or maintain a healthy weight.  For females 43 years of age and older: ? A BMI below 18.5 is considered underweight. ? A BMI of 18.5 to 24.9 is normal. ? A BMI of 25 to 29.9 is considered overweight. ? A BMI of 30 and above is considered obese.  Watch levels of cholesterol and blood lipids  You should start having your blood tested for lipids and cholesterol at 72 years of age, then have this test every 5 years.  You may need to have your cholesterol levels checked more often if: ? Your lipid or cholesterol levels are high. ? You are older than 72 years of age. ? You are at high risk for heart disease.  Cancer screening Lung Cancer  Lung cancer screening is recommended for adults 6-72 years old who are at high risk for  lung cancer because of a history of smoking.  A yearly low-dose CT scan of the lungs is recommended for people who: ? Currently smoke. ? Have quit within the past 15 years. ? Have at least a 30-pack-year history of smoking. A pack year is smoking an average of one pack of cigarettes a day for 1 year.  Yearly screening should continue until it has been 15 years since you  quit.  Yearly screening should stop if you develop a health problem that would prevent you from having lung cancer treatment.  Breast Cancer  Practice breast self-awareness. This means understanding how your breasts normally appear and feel.  It also means doing regular breast self-exams. Let your health care provider know about any changes, no matter how small.  If you are in your 20s or 30s, you should have a clinical breast exam (CBE) by a health care provider every 1-3 years as part of a regular health exam.  If you are 2 or older, have a CBE every year. Also consider having a breast X-ray (mammogram) every year.  If you have a family history of breast cancer, talk to your health care provider about genetic screening.  If you are at high risk for breast cancer, talk to your health care provider about having an MRI and a mammogram every year.  Breast cancer gene (BRCA) assessment is recommended for women who have family members with BRCA-related cancers. BRCA-related cancers include: ? Breast. ? Ovarian. ? Tubal. ? Peritoneal cancers.  Results of the assessment will determine the need for genetic counseling and BRCA1 and BRCA2 testing.  Cervical Cancer Your health care provider may recommend that you be screened regularly for cancer of the pelvic organs (ovaries, uterus, and vagina). This screening involves a pelvic examination, including checking for microscopic changes to the surface of your cervix (Pap test). You may be encouraged to have this screening done every 3 years, beginning at age 23.  For women ages 89-65, health care providers may recommend pelvic exams and Pap testing every 3 years, or they may recommend the Pap and pelvic exam, combined with testing for human papilloma virus (HPV), every 5 years. Some types of HPV increase your risk of cervical cancer. Testing for HPV may also be done on women of any age with unclear Pap test results.  Other health care providers  may not recommend any screening for nonpregnant women who are considered low risk for pelvic cancer and who do not have symptoms. Ask your health care provider if a screening pelvic exam is right for you.  If you have had past treatment for cervical cancer or a condition that could lead to cancer, you need Pap tests and screening for cancer for at least 20 years after your treatment. If Pap tests have been discontinued, your risk factors (such as having a new sexual partner) need to be reassessed to determine if screening should resume. Some women have medical problems that increase the chance of getting cervical cancer. In these cases, your health care provider may recommend more frequent screening and Pap tests.  Colorectal Cancer  This type of cancer can be detected and often prevented.  Routine colorectal cancer screening usually begins at 72 years of age and continues through 72 years of age.  Your health care provider may recommend screening at an earlier age if you have risk factors for colon cancer.  Your health care provider may also recommend using home test kits to check for hidden blood in  the stool.  A small camera at the end of a tube can be used to examine your colon directly (sigmoidoscopy or colonoscopy). This is done to check for the earliest forms of colorectal cancer.  Routine screening usually begins at age 19.  Direct examination of the colon should be repeated every 5-10 years through 72 years of age. However, you may need to be screened more often if early forms of precancerous polyps or small growths are found.  Skin Cancer  Check your skin from head to toe regularly.  Tell your health care provider about any new moles or changes in moles, especially if there is a change in a mole's shape or color.  Also tell your health care provider if you have a mole that is larger than the size of a pencil eraser.  Always use sunscreen. Apply sunscreen liberally and repeatedly  throughout the day.  Protect yourself by wearing long sleeves, pants, a wide-brimmed hat, and sunglasses whenever you are outside.  Heart disease, diabetes, and high blood pressure  High blood pressure causes heart disease and increases the risk of stroke. High blood pressure is more likely to develop in: ? People who have blood pressure in the high end of the normal range (130-139/85-89 mm Hg). ? People who are overweight or obese. ? People who are African American.  If you are 28-45 years of age, have your blood pressure checked every 3-5 years. If you are 58 years of age or older, have your blood pressure checked every year. You should have your blood pressure measured twice-once when you are at a hospital or clinic, and once when you are not at a hospital or clinic. Record the average of the two measurements. To check your blood pressure when you are not at a hospital or clinic, you can use: ? An automated blood pressure machine at a pharmacy. ? A home blood pressure monitor.  If you are between 56 years and 15 years old, ask your health care provider if you should take aspirin to prevent strokes.  Have regular diabetes screenings. This involves taking a blood sample to check your fasting blood sugar level. ? If you are at a normal weight and have a low risk for diabetes, have this test once every three years after 72 years of age. ? If you are overweight and have a high risk for diabetes, consider being tested at a younger age or more often. Preventing infection Hepatitis B  If you have a higher risk for hepatitis B, you should be screened for this virus. You are considered at high risk for hepatitis B if: ? You were born in a country where hepatitis B is common. Ask your health care provider which countries are considered high risk. ? Your parents were born in a high-risk country, and you have not been immunized against hepatitis B (hepatitis B vaccine). ? You have HIV or AIDS. ? You  use needles to inject street drugs. ? You live with someone who has hepatitis B. ? You have had sex with someone who has hepatitis B. ? You get hemodialysis treatment. ? You take certain medicines for conditions, including cancer, organ transplantation, and autoimmune conditions.  Hepatitis C  Blood testing is recommended for: ? Everyone born from [birth year] through 1965. ? Anyone with known risk factors for hepatitis C.  Sexually transmitted infections (STIs)  You should be screened for sexually transmitted infections (STIs) including gonorrhea and chlamydia if: ? You are sexually active and are  younger than 72 years of age. ? You are older than 72 years of age and your health care provider tells you that you are at risk for this type of infection. ? Your sexual activity has changed since you were last screened and you are at an increased risk for chlamydia or gonorrhea. Ask your health care provider if you are at risk.  If you do not have HIV, but are at risk, it may be recommended that you take a prescription medicine daily to prevent HIV infection. This is called pre-exposure prophylaxis (PrEP). You are considered at risk if: ? You are sexually active and do not regularly use condoms or know the HIV status of your partner(s). ? You take drugs by injection. ? You are sexually active with a partner who has HIV.  Talk with your health care provider about whether you are at high risk of being infected with HIV. If you choose to begin PrEP, you should first be tested for HIV. You should then be tested every 3 months for as long as you are taking PrEP. Pregnancy  If you are premenopausal and you may become pregnant, ask your health care provider about preconception counseling.  If you may become pregnant, take 400 to 800 micrograms (mcg) of folic acid every day.  If you want to prevent pregnancy, talk to your health care provider about birth control (contraception). Osteoporosis and  menopause  Osteoporosis is a disease in which the bones lose minerals and strength with aging. This can result in serious bone fractures. Your risk for osteoporosis can be identified using a bone density scan.  If you are 25 years of age or older, or if you are at risk for osteoporosis and fractures, ask your health care provider if you should be screened.  Ask your health care provider whether you should take a calcium or vitamin D supplement to lower your risk for osteoporosis.  Menopause may have certain physical symptoms and risks.  Hormone replacement therapy may reduce some of these symptoms and risks. Talk to your health care provider about whether hormone replacement therapy is right for you. Follow these instructions at home:  Schedule regular health, dental, and eye exams.  Stay current with your immunizations.  Do not use any tobacco products including cigarettes, chewing tobacco, or electronic cigarettes.  If you are pregnant, do not drink alcohol.  If you are breastfeeding, limit how much and how often you drink alcohol.  Limit alcohol intake to no more than 1 drink per day for nonpregnant women. One drink equals 12 ounces of beer, 5 ounces of wine, or 1 ounces of hard liquor.  Do not use street drugs.  Do not share needles.  Ask your health care provider for help if you need support or information about quitting drugs.  Tell your health care provider if you often feel depressed.  Tell your health care provider if you have ever been abused or do not feel safe at home. This information is not intended to replace advice given to you by your health care provider. Make sure you discuss any questions you have with your health care provider. Document Released: 06/27/2011 Document Revised: 05/19/2016 Document Reviewed: 09/15/2015 Elsevier Interactive Patient Education  2018 [place]. Panosh M.D.

## 2018-05-15 ENCOUNTER — Ambulatory Visit (INDEPENDENT_AMBULATORY_CARE_PROVIDER_SITE_OTHER): Payer: Medicare Other | Admitting: Internal Medicine

## 2018-05-15 ENCOUNTER — Encounter: Payer: Self-pay | Admitting: Internal Medicine

## 2018-05-15 VITALS — BP 110/74 | HR 75 | Temp 98.0°F | Ht 60.0 in | Wt 185.2 lb

## 2018-05-15 DIAGNOSIS — E785 Hyperlipidemia, unspecified: Secondary | ICD-10-CM | POA: Diagnosis not present

## 2018-05-15 DIAGNOSIS — Z79899 Other long term (current) drug therapy: Secondary | ICD-10-CM

## 2018-05-15 DIAGNOSIS — Z0000 Encounter for general adult medical examination without abnormal findings: Secondary | ICD-10-CM | POA: Diagnosis not present

## 2018-05-15 DIAGNOSIS — Z6836 Body mass index (BMI) 36.0-36.9, adult: Secondary | ICD-10-CM | POA: Diagnosis not present

## 2018-05-15 LAB — COMPREHENSIVE METABOLIC PANEL
ALBUMIN: 4 g/dL (ref 3.5–5.2)
ALT: 12 U/L (ref 0–35)
AST: 15 U/L (ref 0–37)
Alkaline Phosphatase: 78 U/L (ref 39–117)
BILIRUBIN TOTAL: 1.1 mg/dL (ref 0.2–1.2)
BUN: 11 mg/dL (ref 6–23)
CALCIUM: 9.1 mg/dL (ref 8.4–10.5)
CO2: 28 mEq/L (ref 19–32)
CREATININE: 0.66 mg/dL (ref 0.40–1.20)
Chloride: 104 mEq/L (ref 96–112)
GFR: 93.68 mL/min (ref >60.00)
Glucose, Bld: 86 mg/dL (ref 70–99)
Potassium: 4.2 mEq/L (ref 3.5–5.1)
Sodium: 139 mEq/L (ref 135–145)
Total Protein: 6.7 g/dL (ref 6.0–8.3)

## 2018-05-15 LAB — LIPID PANEL
CHOL/HDL RATIO: 3
CHOLESTEROL: 227 mg/dL — AB (ref 0–200)
HDL: 66 mg/dL (ref >39.00)
NONHDL: 161.36
TRIGLYCERIDES: 214 mg/dL — AB (ref 0.0–149.0)
VLDL: 42.8 mg/dL — ABNORMAL HIGH (ref 0.0–40.0)

## 2018-05-15 LAB — LDL CHOLESTEROL, DIRECT: LDL DIRECT: 136 mg/dL

## 2018-05-15 NOTE — Patient Instructions (Addendum)
Get your mammogram.     Continue lifestyle intervention healthy eating and exercise . And healty weight loss and decrease the    Alcohol  Intake .        Health Maintenance, Female Adopting a healthy lifestyle and getting preventive care can go a long way to promote health and wellness. Talk with your health care provider about what schedule of regular examinations is right for you. This is a good chance for you to check in with your provider about disease prevention and staying healthy. In between checkups, there are plenty of things you can do on your own. Experts have done a lot of research about which lifestyle changes and preventive measures are most likely to keep you healthy. Ask your health care provider for more information. Weight and diet Eat a healthy diet  Be sure to include plenty of vegetables, fruits, low-fat dairy products, and lean protein.  Do not eat a lot of foods high in solid fats, added sugars, or salt.  Get regular exercise. This is one of the most important things you can do for your health. ? Most adults should exercise for at least 150 minutes each week. The exercise should increase your heart rate and make you sweat (moderate-intensity exercise). ? Most adults should also do strengthening exercises at least twice a week. This is in addition to the moderate-intensity exercise.  Maintain a healthy weight  Body mass index (BMI) is a measurement that can be used to identify possible weight problems. It estimates body fat based on height and weight. Your health care provider can help determine your BMI and help you achieve or maintain a healthy weight.  For females 44 years of age and older: ? A BMI below 18.5 is considered underweight. ? A BMI of 18.5 to 24.9 is normal. ? A BMI of 25 to 29.9 is considered overweight. ? A BMI of 30 and above is considered obese.  Watch levels of cholesterol and blood lipids  You should start having your blood tested for  lipids and cholesterol at 72 years of age, then have this test every 5 years.  You may need to have your cholesterol levels checked more often if: ? Your lipid or cholesterol levels are high. ? You are older than 72 years of age. ? You are at high risk for heart disease.  Cancer screening Lung Cancer  Lung cancer screening is recommended for adults 92-85 years old who are at high risk for lung cancer because of a history of smoking.  A yearly low-dose CT scan of the lungs is recommended for people who: ? Currently smoke. ? Have quit within the past 15 years. ? Have at least a 30-pack-year history of smoking. A pack year is smoking an average of one pack of cigarettes a day for 1 year.  Yearly screening should continue until it has been 15 years since you quit.  Yearly screening should stop if you develop a health problem that would prevent you from having lung cancer treatment.  Breast Cancer  Practice breast self-awareness. This means understanding how your breasts normally appear and feel.  It also means doing regular breast self-exams. Let your health care provider know about any changes, no matter how small.  If you are in your 20s or 30s, you should have a clinical breast exam (CBE) by a health care provider every 1-3 years as part of a regular health exam.  If you are 38 or older, have a CBE every  year. Also consider having a breast X-ray (mammogram) every year.  If you have a family history of breast cancer, talk to your health care provider about genetic screening.  If you are at high risk for breast cancer, talk to your health care provider about having an MRI and a mammogram every year.  Breast cancer gene (BRCA) assessment is recommended for women who have family members with BRCA-related cancers. BRCA-related cancers include: ? Breast. ? Ovarian. ? Tubal. ? Peritoneal cancers.  Results of the assessment will determine the need for genetic counseling and BRCA1 and  BRCA2 testing.  Cervical Cancer Your health care provider may recommend that you be screened regularly for cancer of the pelvic organs (ovaries, uterus, and vagina). This screening involves a pelvic examination, including checking for microscopic changes to the surface of your cervix (Pap test). You may be encouraged to have this screening done every 3 years, beginning at age 50.  For women ages 58-65, health care providers may recommend pelvic exams and Pap testing every 3 years, or they may recommend the Pap and pelvic exam, combined with testing for human papilloma virus (HPV), every 5 years. Some types of HPV increase your risk of cervical cancer. Testing for HPV may also be done on women of any age with unclear Pap test results.  Other health care providers may not recommend any screening for nonpregnant women who are considered low risk for pelvic cancer and who do not have symptoms. Ask your health care provider if a screening pelvic exam is right for you.  If you have had past treatment for cervical cancer or a condition that could lead to cancer, you need Pap tests and screening for cancer for at least 20 years after your treatment. If Pap tests have been discontinued, your risk factors (such as having a new sexual partner) need to be reassessed to determine if screening should resume. Some women have medical problems that increase the chance of getting cervical cancer. In these cases, your health care provider may recommend more frequent screening and Pap tests.  Colorectal Cancer  This type of cancer can be detected and often prevented.  Routine colorectal cancer screening usually begins at 72 years of age and continues through 72 years of age.  Your health care provider may recommend screening at an earlier age if you have risk factors for colon cancer.  Your health care provider may also recommend using home test kits to check for hidden blood in the stool.  A small camera at the  end of a tube can be used to examine your colon directly (sigmoidoscopy or colonoscopy). This is done to check for the earliest forms of colorectal cancer.  Routine screening usually begins at age 49.  Direct examination of the colon should be repeated every 5-10 years through 72 years of age. However, you may need to be screened more often if early forms of precancerous polyps or small growths are found.  Skin Cancer  Check your skin from head to toe regularly.  Tell your health care provider about any new moles or changes in moles, especially if there is a change in a mole's shape or color.  Also tell your health care provider if you have a mole that is larger than the size of a pencil eraser.  Always use sunscreen. Apply sunscreen liberally and repeatedly throughout the day.  Protect yourself by wearing long sleeves, pants, a wide-brimmed hat, and sunglasses whenever you are outside.  Heart disease, diabetes, and  high blood pressure  High blood pressure causes heart disease and increases the risk of stroke. High blood pressure is more likely to develop in: ? People who have blood pressure in the high end of the normal range (130-139/85-89 mm Hg). ? People who are overweight or obese. ? People who are African American.  If you are 81-48 years of age, have your blood pressure checked every 3-5 years. If you are 34 years of age or older, have your blood pressure checked every year. You should have your blood pressure measured twice-once when you are at a hospital or clinic, and once when you are not at a hospital or clinic. Record the average of the two measurements. To check your blood pressure when you are not at a hospital or clinic, you can use: ? An automated blood pressure machine at a pharmacy. ? A home blood pressure monitor.  If you are between 20 years and 59 years old, ask your health care provider if you should take aspirin to prevent strokes.  Have regular diabetes  screenings. This involves taking a blood sample to check your fasting blood sugar level. ? If you are at a normal weight and have a low risk for diabetes, have this test once every three years after 72 years of age. ? If you are overweight and have a high risk for diabetes, consider being tested at a younger age or more often. Preventing infection Hepatitis B  If you have a higher risk for hepatitis B, you should be screened for this virus. You are considered at high risk for hepatitis B if: ? You were born in a country where hepatitis B is common. Ask your health care provider which countries are considered high risk. ? Your parents were born in a high-risk country, and you have not been immunized against hepatitis B (hepatitis B vaccine). ? You have HIV or AIDS. ? You use needles to inject street drugs. ? You live with someone who has hepatitis B. ? You have had sex with someone who has hepatitis B. ? You get hemodialysis treatment. ? You take certain medicines for conditions, including cancer, organ transplantation, and autoimmune conditions.  Hepatitis C  Blood testing is recommended for: ? Everyone born from [birth year] through 1965. ? Anyone with known risk factors for hepatitis C.  Sexually transmitted infections (STIs)  You should be screened for sexually transmitted infections (STIs) including gonorrhea and chlamydia if: ? You are sexually active and are younger than 72 years of age. ? You are older than 72 years of age and your health care provider tells you that you are at risk for this type of infection. ? Your sexual activity has changed since you were last screened and you are at an increased risk for chlamydia or gonorrhea. Ask your health care provider if you are at risk.  If you do not have HIV, but are at risk, it may be recommended that you take a prescription medicine daily to prevent HIV infection. This is called pre-exposure prophylaxis (PrEP). You are considered at risk  if: ? You are sexually active and do not regularly use condoms or know the HIV status of your partner(s). ? You take drugs by injection. ? You are sexually active with a partner who has HIV.  Talk with your health care provider about whether you are at high risk of being infected with HIV. If you choose to begin PrEP, you should first be tested for HIV. You should then be  tested every 3 months for as long as you are taking PrEP. Pregnancy  If you are premenopausal and you may become pregnant, ask your health care provider about preconception counseling.  If you may become pregnant, take 400 to 800 micrograms (mcg) of folic acid every day.  If you want to prevent pregnancy, talk to your health care provider about birth control (contraception). Osteoporosis and menopause  Osteoporosis is a disease in which the bones lose minerals and strength with aging. This can result in serious bone fractures. Your risk for osteoporosis can be identified using a bone density scan.  If you are 105 years of age or older, or if you are at risk for osteoporosis and fractures, ask your health care provider if you should be screened.  Ask your health care provider whether you should take a calcium or vitamin D supplement to lower your risk for osteoporosis.  Menopause may have certain physical symptoms and risks.  Hormone replacement therapy may reduce some of these symptoms and risks. Talk to your health care provider about whether hormone replacement therapy is right for you. Follow these instructions at home:  Schedule regular health, dental, and eye exams.  Stay current with your immunizations.  Do not use any tobacco products including cigarettes, chewing tobacco, or electronic cigarettes.  If you are pregnant, do not drink alcohol.  If you are breastfeeding, limit how much and how often you drink alcohol.  Limit alcohol intake to no more than 1 drink per day for nonpregnant women. One drink  equals 12 ounces of beer, 5 ounces of wine, or 1 ounces of hard liquor.  Do not use street drugs.  Do not share needles.  Ask your health care provider for help if you need support or information about quitting drugs.  Tell your health care provider if you often feel depressed.  Tell your health care provider if you have ever been abused or do not feel safe at home. This information is not intended to replace advice given to you by your health care provider. Make sure you discuss any questions you have with your health care provider. Document Released: 06/27/2011 Document Revised: 05/19/2016 Document Reviewed: 09/15/2015 Elsevier Interactive Patient Education  [employer].

## 2018-08-03 ENCOUNTER — Encounter: Payer: Self-pay | Admitting: Internal Medicine

## 2018-08-07 LAB — SURGICAL PATHOLOGY

## 2018-08-14 ENCOUNTER — Encounter: Payer: Self-pay | Admitting: Internal Medicine

## 2019-03-05 NOTE — Patient Instructions (Addendum)
Advise get  Ear and hearing  Evaluation.  This vertigo may be BPPV  But if on going suggests  Consider  Neuro rehab physical therapy .  At this t ime   Fall caution prevention.   Call us about referral     Benign Positional Vertigo Vertigo is the feeling that you or your surroundings are moving when they are not. Benign positional vertigo is the most common form of vertigo. This is usually a harmless condition (benign). This condition is positional. This means that symptoms are triggered by certain movements and positions. This condition can be dangerous if it occurs while you are doing something that could cause harm to you or others. This includes activities such as driving or operating machinery. What are the causes? In many cases, the cause of this condition is not known. It may be caused by a disturbance in an area of the inner ear that helps your brain to sense movement and balance. This disturbance can be caused by:  Viral infection (labyrinthitis).  Head injury.  Repetitive motion, such as jumping, dancing, or running. What increases the risk? You are more likely to develop this condition if:  You are a woman.  You are 73 years of age or older. What are the signs or symptoms? Symptoms of this condition usually happen when you move your head or your eyes in different directions. Symptoms may start suddenly, and usually last for less than a minute. They include:  Loss of balance and falling.  Feeling like you are spinning or moving.  Feeling like your surroundings are spinning or moving.  Nausea and vomiting.  Blurred vision.  Dizziness.  Involuntary eye movement (nystagmus). Symptoms can be mild and cause only minor problems, or they can be severe and interfere with daily life. Episodes of benign positional vertigo may return (recur) over time. Symptoms may improve over time. How is this diagnosed? This condition may be diagnosed based on:  Your medical  history.  Physical exam of the head, neck, and ears.  Tests, such as: ? MRI. ? CT scan. ? Eye movement tests. Your health care provider may ask you to change positions quickly while he or she watches you for symptoms of benign positional vertigo, such as nystagmus. Eye movement may be tested with a variety of exams that are designed to evaluate or stimulate vertigo. ? An electroencephalogram (EEG). This records electrical activity in your brain. ? Hearing tests. You may be referred to a health care provider who specializes in ear, nose, and throat (ENT) problems (otolaryngologist) or a provider who specializes in disorders of the nervous system (neurologist). How is this treated?  This condition may be treated in a session in which your health care provider moves your head in specific positions to adjust your inner ear back to normal. Treatment for this condition may take several sessions. Surgery may be needed in severe cases, but this is rare. In some cases, benign positional vertigo may resolve on its own in 2-4 weeks. Follow these instructions at home: Safety  Move slowly. Avoid sudden body or head movements or certain positions, as told by your health care provider.  Avoid driving until your health care provider says it is safe for you to do so.  Avoid operating heavy machinery until your health care provider says it is safe for you to do so.  Avoid doing any tasks that would be dangerous to you or others if vertigo occurs.  If you have trouble walking or  keeping your balance, try using a cane for stability. If you feel dizzy or unstable, sit down right away.  Return to your normal activities as told by your health care provider. Ask your health care provider what activities are safe for you. General instructions  Take over-the-counter and prescription medicines only as told by your health care provider.  Drink enough fluid to keep your urine pale yellow.  Keep all follow-up  visits as told by your health care provider. This is important. Contact a health care provider if:  You have a fever.  Your condition gets worse or you develop new symptoms.  Your family or friends notice any behavioral changes.  You have nausea or vomiting that gets worse.  You have numbness or a "pins and needles" sensation. Get help right away if you:  Have difficulty speaking or moving.  Are always dizzy.  Faint.  Develop severe headaches.  Have weakness in your legs or arms.  Have changes in your hearing or vision.  Develop a stiff neck.  Develop sensitivity to light. Summary  Vertigo is the feeling that you or your surroundings are moving when they are not. Benign positional vertigo is the most common form of vertigo.  The cause of this condition is not known. It may be caused by a disturbance in an area of the inner ear that helps your brain to sense movement and balance.  Symptoms include loss of balance and falling, feeling that you or your surroundings are moving, nausea and vomiting, and blurred vision.  This condition can be diagnosed based on symptoms, physical exam, and other tests, such as MRI, CT scan, eye movement tests, and hearing tests.  Follow safety instructions as told by your health care provider. You will also be told when to contact your health care provider in case of problems. This information is not intended to replace advice given to you by your health care provider. Make sure you discuss any questions you have with your health care provider. Document Released: 09/19/2006 Document Revised: 05/23/2018 Document Reviewed: 05/23/2018 Elsevier Interactive Patient Education  2019 [employer]. Vertigo Vertigo is the feeling that you or your surroundings are moving when they are not. Vertigo can be dangerous if it occurs while you are doing something that could endanger you or others, such as driving. What are the causes? This condition is caused  by a disturbance in the signals that are sent by your body's sensory systems to your brain. Different causes of a disturbance can lead to vertigo, including:  Infections, especially in the inner ear.  A bad reaction to a drug, or misuse of alcohol and medicines.  Withdrawal from drugs or alcohol.  Quickly changing positions, as when lying down or rolling over in bed.  Migraine headaches.  Decreased blood flow to the brain.  Decreased blood pressure.  Increased pressure in the brain from a head or neck injury, stroke, infection, tumor, or bleeding.  Central nervous system disorders. What are the signs or symptoms? Symptoms of this condition usually occur when you move your head or your eyes in different directions. Symptoms may start suddenly, and they usually last for less than a minute. Symptoms may include:  Loss of balance and falling.  Feeling like you are spinning or moving.  Feeling like your surroundings are spinning or moving.  Nausea and vomiting.  Blurred vision or double vision.  Difficulty hearing.  Slurred speech.  Dizziness.  Involuntary eye movement (nystagmus). Symptoms can be mild and cause  only slight annoyance, or they can be severe and interfere with daily life. Episodes of vertigo may return (recur) over time, and they are often triggered by certain movements. Symptoms may improve over time. How is this diagnosed? This condition may be diagnosed based on medical history and the quality of your nystagmus. Your health care provider may test your eye movements by asking you to quickly change positions to trigger the nystagmus. This may be called the Dix-Hallpike test, head thrust test, or roll test. You may be referred to a health care provider who specializes in ear, nose, and throat (ENT) problems (otolaryngologist) or a provider who specializes in disorders of the central nervous system (neurologist). You may have additional testing, including:  A  physical exam.  Blood tests.  MRI.  A CT scan.  An electrocardiogram (ECG). This records electrical activity in your heart.  An electroencephalogram (EEG). This records electrical activity in your brain.  Hearing tests. How is this treated? Treatment for this condition depends on the cause and the severity of the symptoms. Treatment options include:  Medicines to treat nausea or vertigo. These are usually used for severe cases. Some medicines that are used to treat other conditions may also reduce or eliminate vertigo symptoms. These include: ? Medicines that control allergies (antihistamines). ? Medicines that control seizures (anticonvulsants). ? Medicines that relieve depression (antidepressants). ? Medicines that relieve anxiety (sedatives).  Head movements to adjust your inner ear back to normal. If your vertigo is caused by an ear problem, your health care provider may recommend certain movements to correct the problem.  Surgery. This is rare. Follow these instructions at home: Safety  Move slowly.Avoid sudden body or head movements.  Avoid driving.  Avoid operating heavy machinery.  Avoid doing any tasks that would cause danger to you or others if you would have a vertigo episode during the task.  If you have trouble walking or keeping your balance, try using a cane for stability. If you feel dizzy or unstable, sit down right away.  Return to your normal activities as told by your health care provider. Ask your health care provider what activities are safe for you. General instructions  Take over-the-counter and prescription medicines only as told by your health care provider.  Avoid certain positions or movements as told by your health care provider.  Drink enough fluid to keep your urine clear or pale yellow.  Keep all follow-up visits as told by your health care provider. This is important. Contact a health care provider if:  Your medicines do not relieve  your vertigo or they make it worse.  You have a fever.  Your condition gets worse or you develop new symptoms.  Your family or friends notice any behavioral changes.  Your nausea or vomiting gets worse.  You have numbness or a "pins and needles" sensation in part of your body. Get help right away if:  You have difficulty moving or speaking.  You are always dizzy.  You faint.  You develop severe headaches.  You have weakness in your hands, arms, or legs.  You have changes in your hearing or vision.  You develop a stiff neck.  You develop sensitivity to light. This information is not intended to replace advice given to you by your health care provider. Make sure you discuss any questions you have with your health care provider. Document Released: 09/21/2005 Document Revised: 05/25/2016 Document Reviewed: 04/06/2015 Elsevier Interactive Patient Education  2019 [employer].

## 2019-03-05 NOTE — Progress Notes (Signed)
Chief Complaint  Patient presents with  . Dizziness    Pt states she has been having vertigo at night when laying in bed. Pt states that she feels like the room is spinning     HPI: Samantha [name] 73 y.o. come in for  Problem  w vertigo sx . Onset a few weeks ago of spinning  Usually when lays back and sometimes  Laying on one side  No vision  change no numbness falling  Or head ache  Became problematic tic  March 3 ?  Hearing is poor  And should get eval for hearing aids  No fever . Has a form  To be able to  Not fly early may because of this condition and concern    No head trauma  No vomiting  Fever  No falling "yet"   ROS: See pertinent positives and negatives per HPI.  Past Medical History:  Diagnosis Date  . Colonic polyp     Family History  Problem Relation Age of Onset  . Heart attack Mother        63  . Hypertension Mother   . Cancer Sister        colon - 46  . Other Daughter        murdered  .  () Unknown     Social History   Socioeconomic History  . Marital status: Divorced    Spouse name: Not on file  . Number of children: Not on file  . Years of education: Not on file  . Highest education level: Not on file  Occupational History  . Not on file  Social Needs  . Financial resource strain: Not on file  . Food insecurity:    Worry: Not on file    Inability: Not on file  . Transportation needs:    Medical: Not on file    Non-medical: Not on file  Tobacco Use  . Smoking status: Never Smoker  . Smokeless tobacco: Never Used  Substance and Sexual Activity  . Alcohol use: Yes    Alcohol/week: 10.0 standard drinks    Types: 10 Glasses of wine per week  . Drug use: No  . Sexual activity: Not on file  Lifestyle  . Physical activity:    Days per week: Not on file    Minutes per session: Not on file  . Stress: Not on file  Relationships  . Social connections:    Talks on phone: Not on file    Gets together: Not on file    Attends religious service:  Not on file    Active member of club or organization: Not on file    Attends meetings of clubs or organizations: Not on file    Relationship status: Not on file  Other Topics Concern  . 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   No facility-administered medications prior to visit.      EXAM:  BP 128/76 (BP Location: Right Arm, Patient Position: Sitting, Cuff Size: Normal)   Pulse 82   Temp 97.7 F (36.5 C) (Oral)   Wt 190 lb 3.2 oz (86.3 kg)   BMI 37.15 kg/m  Body mass index is 37.15 kg/m.  GENERAL: vitals reviewed and listed above, alert, oriented, appears well hydrated and in no acute distress hard of hearing  HEENT: atraumatic, conjunctiva  clear, no obvious abnormalities on inspection of external nose and ears tmx nad thickned tms   No face pain  OP : no lesion edema or exudate  Tongue midline  eoms nl when laying back min vertigo today  NECK: no obvious masses on inspection palpation  LUNGS: clear to auscultation bilaterally, no wheezes, rales or rhonchi, good air movement CV: HRRR, no clubbing cyanosis or  peripheral edema nl cap refill  MS: moves all extremities without noticeable focal  Abnormality slow gait  Neuro non focal otherwise    PSYCH: pleasant and cooperative, no obvious depression or anxiety Lab Results  Component Value Date   WBC 8.5 04/26/2017   HGB 13.3 04/26/2017   HCT 39.6 04/26/2017   PLT 246.0 04/26/2017   GLUCOSE 86 05/15/2018   CHOL 227 (H) 05/15/2018   TRIG 214.0 (H) 05/15/2018   HDL 66.00 05/15/2018   LDLDIRECT 136.0 05/15/2018   LDLCALC 121 (H) 04/26/2017   ALT 12 05/15/2018   AST 15 05/15/2018   NA 139 05/15/2018   K 4.2 05/15/2018   CL 104 05/15/2018   CREATININE 0.66 05/15/2018   BUN 11 05/15/2018   CO2 28  05/15/2018   TSH 1.53 04/26/2017   HGBA1C 4.9 03/12/2013   BP Readings from Last 3 Encounters:  03/06/19 128/76  05/15/18 110/74  05/03/17 134/78    ASSESSMENT AND PLAN:  Discussed the following assessment and plan:  Vertigo - prob bppv  may benefit from  vesitbular rehab pt if persistent   Bilateral hearing loss, unspecified hearing loss type - advise eharing eval  may help  prob not related to above Will complete form  For travel insurance   If on going condition should not travel.   -Patient advised to return or notify health care team  if  new concerns arise.  Patient Instructions  Advise get  Ear and hearing  Evaluation.  This vertigo may be BPPV  But if on going suggests  Consider  Neuro rehab physical therapy .  At this t ime   Fall caution prevention.   Call us about referral     Benign Positional Vertigo Vertigo is the feeling that you or your surroundings are moving when they are not. Benign positional vertigo is the most common form of vertigo. This is usually a harmless condition (benign). This condition is positional. This means that symptoms are triggered by certain movements and positions. This condition can be dangerous if it occurs while you are doing something that could cause harm to you or others. This includes activities such as driving or operating machinery. What are the causes? In many cases, the cause of this condition is not known. It may be caused by a disturbance in an area of the inner ear that helps your brain to sense movement and balance. This disturbance can be caused by:  Viral infection (labyrinthitis).  Head injury.  Repetitive motion, such as jumping, dancing, or running. What increases the risk? You are more likely to develop this condition if:  You are a woman.  You are 50 years of age or older. What are the signs or symptoms? Symptoms of this condition usually happen when you move your head or your eyes in different directions.  Symptoms may start suddenly, and usually last for less than a minute. They  include:  Loss of balance and falling.  Feeling like you are spinning or moving.  Feeling like your surroundings are spinning or moving.  Nausea and vomiting.  Blurred vision.  Dizziness.  Involuntary eye movement (nystagmus). Symptoms can be mild and cause only minor problems, or they can be severe and interfere with daily life. Episodes of benign positional vertigo may return (recur) over time. Symptoms may improve over time. How is this diagnosed? This condition may be diagnosed based on:  Your medical history.  Physical exam of the head, neck, and ears.  Tests, such as: ? MRI. ? CT scan. ? Eye movement tests. Your health care provider may ask you to change positions quickly while he or she watches you for symptoms of benign positional vertigo, such as nystagmus. Eye movement may be tested with a variety of exams that are designed to evaluate or stimulate vertigo. ? An electroencephalogram (EEG). This records electrical activity in your brain. ? Hearing tests. You may be referred to a health care provider who specializes in ear, nose, and throat (ENT) problems (otolaryngologist) or a provider who specializes in disorders of the nervous system (neurologist). How is this treated?  This condition may be treated in a session in which your health care provider moves your head in specific positions to adjust your inner ear back to normal. Treatment for this condition may take several sessions. Surgery may be needed in severe cases, but this is rare. In some cases, benign positional vertigo may resolve on its own in 2-4 weeks. Follow these instructions at home: Safety  Move slowly. Avoid sudden body or head movements or certain positions, as told by your health care provider.  Avoid driving until your health care provider says it is safe for you to do so.  Avoid operating heavy machinery until your health  care provider says it is safe for you to do so.  Avoid doing any tasks that would be dangerous to you or others if vertigo occurs.  If you have trouble walking or keeping your balance, try using a cane for stability. If you feel dizzy or unstable, sit down right away.  Return to your normal activities as told by your health care provider. Ask your health care provider what activities are safe for you. General instructions  Take over-the-counter and prescription medicines only as told by your health care provider.  Drink enough fluid to keep your urine pale yellow.  Keep all follow-up visits as told by your health care provider. This is important. Contact a health care provider if:  You have a fever.  Your condition gets worse or you develop new symptoms.  Your family or friends notice any behavioral changes.  You have nausea or vomiting that gets worse.  You have numbness or a "pins and needles" sensation. Get help right away if you:  Have difficulty speaking or moving.  Are always dizzy.  Faint.  Develop severe headaches.  Have weakness in your legs or arms.  Have changes in your hearing or vision.  Develop a stiff neck.  Develop sensitivity to light. Summary  Vertigo is the feeling that you or your surroundings are moving when they are not. Benign positional vertigo is the most common form of vertigo.  The cause of this condition is not known. It may be caused by a disturbance in an area of the inner ear that helps your brain to sense movement and balance.  Symptoms include loss of balance and falling, feeling that you  or your surroundings are moving, nausea and vomiting, and blurred vision.  This condition can be diagnosed based on symptoms, physical exam, and other tests, such as MRI, CT scan, eye movement tests, and hearing tests.  Follow safety instructions as told by your health care provider. You will also be told when to contact your health care provider  in case of problems. This information is not intended to replace advice given to you by your health care provider. Make sure you discuss any questions you have with your health care provider. Document Released: 09/19/2006 Document Revised: 05/23/2018 Document Reviewed: 05/23/2018 Elsevier Interactive Patient Education  2019 [employer]. Vertigo Vertigo is the feeling that you or your surroundings are moving when they are not. Vertigo can be dangerous if it occurs while you are doing something that could endanger you or others, such as driving. What are the causes? This condition is caused by a disturbance in the signals that are sent by your body's sensory systems to your brain. Different causes of a disturbance can lead to vertigo, including:  Infections, especially in the inner ear.  A bad reaction to a drug, or misuse of alcohol and medicines.  Withdrawal from drugs or alcohol.  Quickly changing positions, as when lying down or rolling over in bed.  Migraine headaches.  Decreased blood flow to the brain.  Decreased blood pressure.  Increased pressure in the brain from a head or neck injury, stroke, infection, tumor, or bleeding.  Central nervous system disorders. What are the signs or symptoms? Symptoms of this condition usually occur when you move your head or your eyes in different directions. Symptoms may start suddenly, and they usually last for less than a minute. Symptoms may include:  Loss of balance and falling.  Feeling like you are spinning or moving.  Feeling like your surroundings are spinning or moving.  Nausea and vomiting.  Blurred vision or double vision.  Difficulty hearing.  Slurred speech.  Dizziness.  Involuntary eye movement (nystagmus). Symptoms can be mild and cause only slight annoyance, or they can be severe and interfere with daily life. Episodes of vertigo may return (recur) over time, and they are often triggered by certain movements.  Symptoms may improve over time. How is this diagnosed? This condition may be diagnosed based on medical history and the quality of your nystagmus. Your health care provider may test your eye movements by asking you to quickly change positions to trigger the nystagmus. This may be called the Dix-Hallpike test, head thrust test, or roll test. You may be referred to a health care provider who specializes in ear, nose, and throat (ENT) problems (otolaryngologist) or a provider who specializes in disorders of the central nervous system (neurologist). You may have additional testing, including:  A physical exam.  Blood tests.  MRI.  A CT scan.  An electrocardiogram (ECG). This records electrical activity in your heart.  An electroencephalogram (EEG). This records electrical activity in your brain.  Hearing tests. How is this treated? Treatment for this condition depends on the cause and the severity of the symptoms. Treatment options include:  Medicines to treat nausea or vertigo. These are usually used for severe cases. Some medicines that are used to treat other conditions may also reduce or eliminate vertigo symptoms. These include: ? Medicines that control allergies (antihistamines). ? Medicines that control seizures (anticonvulsants). ? Medicines that relieve depression (antidepressants). ? Medicines that relieve anxiety (sedatives).  Head movements to adjust your inner ear back to normal. If  your vertigo is caused by an ear problem, your health care provider may recommend certain movements to correct the problem.  Surgery. This is rare. Follow these instructions at home: Safety  Move slowly.Avoid sudden body or head movements.  Avoid driving.  Avoid operating heavy machinery.  Avoid doing any tasks that would cause danger to you or others if you would have a vertigo episode during the task.  If you have trouble walking or keeping your balance, try using a cane for stability.  If you feel dizzy or unstable, sit down right away.  Return to your normal activities as told by your health care provider. Ask your health care provider what activities are safe for you. General instructions  Take over-the-counter and prescription medicines only as told by your health care provider.  Avoid certain positions or movements as told by your health care provider.  Drink enough fluid to keep your urine clear or pale yellow.  Keep all follow-up visits as told by your health care provider. This is important. Contact a health care provider if:  Your medicines do not relieve your vertigo or they make it worse.  You have a fever.  Your condition gets worse or you develop new symptoms.  Your family or friends notice any behavioral changes.  Your nausea or vomiting gets worse.  You have numbness or a "pins and needles" sensation in part of your body. Get help right away if:  You have difficulty moving or speaking.  You are always dizzy.  You faint.  You develop severe headaches.  You have weakness in your hands, arms, or legs.  You have changes in your hearing or vision.  You develop a stiff neck.  You develop sensitivity to light. This information is not intended to replace advice given to you by your health care provider. Make sure you discuss any questions you have with your health care provider. Document Released: 09/21/2005 Document Revised: 05/25/2016 Document Reviewed: 04/06/2015 Elsevier Interactive Patient Education  2019 [place] K. Panosh M.D.

## 2019-03-06 ENCOUNTER — Other Ambulatory Visit: Payer: Self-pay

## 2019-03-06 ENCOUNTER — Ambulatory Visit: Payer: Medicare Other | Admitting: Internal Medicine

## 2019-03-06 ENCOUNTER — Encounter: Payer: Self-pay | Admitting: Internal Medicine

## 2019-03-06 VITALS — BP 128/76 | HR 82 | Temp 97.7°F | Wt 190.2 lb

## 2019-03-06 DIAGNOSIS — H9193 Unspecified hearing loss, bilateral: Secondary | ICD-10-CM

## 2019-03-06 DIAGNOSIS — R42 Dizziness and giddiness: Secondary | ICD-10-CM

## 2019-03-07 ENCOUNTER — Telehealth: Payer: Self-pay | Admitting: Internal Medicine

## 2019-03-07 NOTE — Telephone Encounter (Signed)
Copied from [place] [id number]. Topic: Quick Communication - See Telephone Encounter >> Mar 07, 2019  7:36 AM Rayann Heman wrote: CRM for notification. See Telephone encounter for: 03/07/19. Pt called and stated that she dropped off a form for travel guard insurance yesterday and would like to know if she can pick this up this afternoon. Please advise.

## 2019-03-07 NOTE — Telephone Encounter (Signed)
Patient calling back to check status-she states she is about to come into office.

## 2019-03-07 NOTE — Telephone Encounter (Signed)
Pt has been advised form is not complete (that forms can take 3-5 business days) and she will be contacted once it has been.

## 2019-03-13 NOTE — Telephone Encounter (Signed)
Pt calling to check status. Please advise  °

## 2019-03-13 NOTE — Telephone Encounter (Signed)
Responded to pt via mychart

## 2019-03-22 ENCOUNTER — Telehealth: Payer: Self-pay

## 2019-03-22 NOTE — Telephone Encounter (Signed)
Informed pt that forms werre available to pick up, upfront

## 2019-09-20 NOTE — Telephone Encounter (Signed)
You are up to date for the pneumonia vaccines  .  Based on our documentation

## 2019-10-07 ENCOUNTER — Telehealth (INDEPENDENT_AMBULATORY_CARE_PROVIDER_SITE_OTHER): Payer: Medicare Other | Admitting: Internal Medicine

## 2019-10-07 ENCOUNTER — Encounter: Payer: Self-pay | Admitting: Internal Medicine

## 2019-10-07 ENCOUNTER — Other Ambulatory Visit: Payer: Self-pay

## 2019-10-07 DIAGNOSIS — R222 Localized swelling, mass and lump, trunk: Secondary | ICD-10-CM | POA: Diagnosis not present

## 2019-10-07 NOTE — Telephone Encounter (Signed)
Called patient and was scheduled for virtual

## 2019-10-07 NOTE — Progress Notes (Signed)
Virtual Visit via Video Note  I connected with@ on 10/07/19 at 10:00 AM EDT by a video enabled telemedicine application and verified that I am speaking with the correct person using two identifiers. Location patient: home Location provider:work  office Persons participating in the virtual visit: patient, provider  [place] national recommendations  regarding COVID 19 pandemic   video visit is advised over in office visit for this patient.  Patient aware  of the limitations of evaluation and management by telemedicine and  availability of in person appointments. and agreed to proceed.   HPI: Samantha [name] presents for video visit over the last week she has noted a lump on her left upper chest that she wonders if is a lymph node she thought it could have been from working out on the rowing machine since going back to the gym.  No specific pain but knows it is there and may be minimal tenderness.  No change no systemic symptoms the size of the fingernail.  Wonders if it could be a lymph node.  She is to get a mammogram tomorrow.    ROS: See pertinent positives and negatives per HPI.  No systemic symptoms noted and no history of trauma  Past Medical History:  Diagnosis Date  . Colonic polyp     Past Surgical History:  Procedure Laterality Date  . CHOLECYSTECTOMY    . TONSILLECTOMY      Family History  Problem Relation Age of Onset  . Heart attack Mother        37  . Hypertension Mother   . Cancer Sister        colon - 55  . Other Daughter        murdered  .  () Unknown     Social History   Tobacco Use  . Smoking status: Never Smoker  . Smokeless tobacco: Never Used  Substance Use Topics  . Alcohol use: Yes    Alcohol/week: 10.0 standard drinks    Types: 10 Glasses of wine per week  . Drug use: No     No current outpatient medications on file.  EXAM: BP Readings from Last 3 Encounters:  03/06/19 128/76  05/15/18 110/74  05/03/17 134/78    VITALS per patient  if applicable:  GENERAL: alert, oriented, appears well and in no acute distress  HEENT: atraumatic, conjunttiva clear, no obvious abnormalities on inspection of external nose and ears NECK: normal movements of the head and neck She points to an area just below the clavicle on the left parasternal area appears to be a small perhaps 1 cm bump no skin changes able to touch it.  Does not feel lumps in the supraclavicular neck area. Range of motion of shoulder etc. shows no discomfort or difficulty. LUNGS: on inspection no signs of respiratory distress, breathing rate appears normal, no obvious gross SOB, gasping or wheezing  CV: no obvious cyanosis  MS: moves all visible extremities without noticeable abnormality  PSYCH/NEURO: pleasant and cooperative, no obvious depression or anxiety, speech and thought processing grossly intact Lab Results  Component Value Date   WBC 8.5 04/26/2017   HGB 13.3 04/26/2017   HCT 39.6 04/26/2017   PLT 246.0 04/26/2017   GLUCOSE 86 05/15/2018   CHOL 227 (H) 05/15/2018   TRIG 214.0 (H) 05/15/2018   HDL 66.00 05/15/2018   LDLDIRECT 136.0 05/15/2018   LDLCALC 121 (H) 04/26/2017   ALT 12 05/15/2018   AST 15 05/15/2018   NA 139 05/15/2018  K 4.2 05/15/2018   CL 104 05/15/2018   CREATININE 0.66 05/15/2018   BUN 11 05/15/2018   CO2 28 05/15/2018   TSH 1.53 04/26/2017   HGBA1C 4.9 03/12/2013    ASSESSMENT AND PLAN:  Discussed the following assessment and plan:    ICD-10-CM   1. Nodule of left anterior chest wall  R22.2    This seems to be a chest wall skin versus chest wall symptom this is not an area on lymph glands and does not appear to be ominous or serious this could be related to sternocostal joints or subcutaneous nodule. Seems like a benign process but will follow  We discussed whether this could be in the breast but appears to be too high to be close to breast tissue.  If it is in the breast would get a diagnostic mammogram but does not  appear to be related At this time will monitor she is due for CPX got delayed under COVID she should schedule for CPX we could examine the area but if getting worse in the meantime contact us again. Counseled.   Expectant management and discussion of plan and treatment with opportunity to ask questions and all were answered. The patient agreed with the plan and demonstrated an understanding of the instructions.   Advised to call back or seek an in-person evaluation if worsening  or having  further concerns .  Shanon Ace, MD

## 2019-10-08 LAB — HM MAMMOGRAPHY

## 2019-11-06 ENCOUNTER — Encounter: Payer: Self-pay | Admitting: Internal Medicine

## 2020-01-03 ENCOUNTER — Telehealth (INDEPENDENT_AMBULATORY_CARE_PROVIDER_SITE_OTHER): Payer: Medicare PPO | Admitting: Internal Medicine

## 2020-01-03 ENCOUNTER — Other Ambulatory Visit: Payer: Self-pay

## 2020-01-03 ENCOUNTER — Encounter: Payer: Self-pay | Admitting: Internal Medicine

## 2020-01-03 DIAGNOSIS — M7080 Other soft tissue disorders related to use, overuse and pressure of unspecified site: Secondary | ICD-10-CM | POA: Diagnosis not present

## 2020-01-03 DIAGNOSIS — M79659 Pain in unspecified thigh: Secondary | ICD-10-CM | POA: Diagnosis not present

## 2020-01-03 DIAGNOSIS — R1030 Lower abdominal pain, unspecified: Secondary | ICD-10-CM | POA: Diagnosis not present

## 2020-01-03 NOTE — Progress Notes (Signed)
Virtual Visit via Video Note  I connected with@ on 01/03/20 at  3:00 PM EST by a video enabled telemedicine application and verified that I am speaking with the correct person using two identifiers. Location patient: home Location provider:work  office Persons participating in the virtual visit: patient, provider  [place] national recommendations  regarding COVID 19 pandemic   video visit is advised over in office visit for this patient.  Patient aware  of the limitations of evaluation and management by telemedicine and  availability of in person appointments. and agreed to proceed.   HPI: KRISELLE HEAVILIN presents for video visit   " Pulled something in my right leg between inside my thigh and torso on Dec. 26th - waiting for it to get better but it's not really. Difficult lifting my leg when entering car etc. "   She has been doing regular walking and losing weight since August by activity and decreasing her wine intake has lost 20 pounds.  However she has done walking at the end of her walk on the day after Christmas she noticed pain in the medial thigh where it attaches to her trunk.  There was no pop fall or acute injury.  Since that time his pain painful and difficult to raise her leg to get in and out of cars etc. but no specific pain with just simple standing weightbearing.  Was concerned about sciatica. She is treated with Aleve Advil and ice and today it is improving and much better.  However wanted to touch base about what kind of activity she could do. She is to do rowing machines before Covid but no longer goes to the gym and wants to stay active. No fever no radiation down the leg or to the knee.  No weakness or numbness at this time No history of similar symptoms ROS: See pertinent positives and negatives per HPI.  Past Medical History:  Diagnosis Date  . Colonic polyp     Past Surgical History:  Procedure Laterality Date  . CHOLECYSTECTOMY    . TONSILLECTOMY      Family  History  Problem Relation Age of Onset  . Heart attack Mother        8  . Hypertension Mother   . Cancer Sister        colon - 54  . Other Daughter        murdered  .  () Unknown     Social History   Tobacco Use  . Smoking status: Never Smoker  . Smokeless tobacco: Never Used  Substance Use Topics  . Alcohol use: Yes    Alcohol/week: 10.0 standard drinks    Types: 10 Glasses of wine per week  . Drug use: No     No current outpatient medications on file.  EXAM: BP Readings from Last 3 Encounters:  03/06/19 128/76  05/15/18 110/74  05/03/17 134/78    VITALS per patient if applicable: GENERAL: alert, oriented, appears well and in no acute distress HEENT: atraumatic, conjunttiva clear, no obvious abnormalities on inspection of external nose and ears NECK: normal movements of the head and neck LUNGS: on inspection no signs of respiratory distress, breathing rate appears normal, no obvious gross SOB, gasping or wheezing CV: no obvious cyanosis MS: moves all visible extremities without noticeable abnormality PSYCH/NEURO: pleasant and cooperative, no obvious depression or anxiety, speech and thought processing grossly intact   ASSESSMENT AND PLAN:  Discussed the following assessment and plan:    ICD-10-CM  1. Inguinal pain, unspecified laterality  R10.30   2. Pain of thigh, unspecified laterality  M79.659   3. Other soft tissue disorders related to use, overuse and pressure of unspecified site  M70.80    By description even without examination it appears to be soft tissue etiology and not internal hip.  However would treat gingerly ice topical anti-inflammatories oral as needed and continued relative rest but total immobilization not needed. Expectant management if not improving continued in another week or 2 contact us consider seeing sports medicine.  At this point it is possible it may take months to improve cannot say exactly the trigger except for possible overuse  as she states she is "walking a lot".  At this point does not sound like an internal hip or fracture problem.  As she is able to weight-bear with out increasing in pain she will continue healthy weight loss. Counseled.   Expectant management and discussion of plan and treatment with opportunity to ask questions and all were answered. The patient agreed with the plan and demonstrated an understanding of the instructions.   Advised to call back or seek an in-person evaluation if worsening  or having  further concerns . Return if symptoms worsen or fail to improve asexpected.  Shanon Ace, MD

## 2020-01-23 ENCOUNTER — Ambulatory Visit: Payer: Medicare PPO

## 2020-01-31 ENCOUNTER — Ambulatory Visit: Payer: Medicare PPO | Attending: Internal Medicine

## 2020-01-31 DIAGNOSIS — Z23 Encounter for immunization: Secondary | ICD-10-CM | POA: Insufficient documentation

## 2020-01-31 NOTE — Progress Notes (Signed)
   Covid-19 Vaccination Clinic  Name:  Samantha [name]    MRN: [id number] DOB: 20-Aug-1946  01/31/2020  Samantha [name] was observed post Covid-19 immunization for 15 minutes without incidence. She was provided with Vaccine Information Sheet and instruction to access the V-Safe system.   Samantha [name] was instructed to call 911 with any severe reactions post vaccine: [name] Kitchen Difficulty breathing  . Swelling of your face and throat  . A fast heartbeat  . A bad rash all over your body  . Dizziness and weakness    Immunizations Administered    Name Date Dose VIS Date Route   Pfizer COVID-19 Vaccine 01/31/2020  3:03 PM 0.3 mL 12/06/2019 Intramuscular   Manufacturer: [place]   Lot: [id number]   [place]: [id number]

## 2020-02-13 ENCOUNTER — Ambulatory Visit: Payer: Medicare PPO

## 2020-02-25 ENCOUNTER — Ambulatory Visit: Payer: Medicare PPO | Attending: Internal Medicine

## 2020-02-25 DIAGNOSIS — Z23 Encounter for immunization: Secondary | ICD-10-CM | POA: Insufficient documentation

## 2020-02-25 NOTE — Progress Notes (Signed)
   Covid-19 Vaccination Clinic  Name:  Samantha [name]    MRN: [id number] DOB: 1946-11-24  02/25/2020  Ms. Isabel was observed post Covid-19 immunization for 15 minutes without incident. She was provided with Vaccine Information Sheet and instruction to access the V-Safe system.   Ms. Tulk was instructed to call 911 with any severe reactions post vaccine: [name] Kitchen Difficulty breathing  . Swelling of face and throat  . A fast heartbeat  . A bad rash all over body  . Dizziness and weakness   Immunizations Administered    Name Date Dose VIS Date Route   Pfizer COVID-19 Vaccine 02/25/2020  2:37 PM 0.3 mL 12/06/2019 Intramuscular   Manufacturer: [place]   Lot: [id number]   [place]: [id number]

## 2020-03-12 DIAGNOSIS — H524 Presbyopia: Secondary | ICD-10-CM | POA: Diagnosis not present

## 2020-03-12 DIAGNOSIS — H5203 Hypermetropia, bilateral: Secondary | ICD-10-CM | POA: Diagnosis not present

## 2020-03-12 DIAGNOSIS — H52223 Regular astigmatism, bilateral: Secondary | ICD-10-CM | POA: Diagnosis not present

## 2020-03-12 DIAGNOSIS — H2513 Age-related nuclear cataract, bilateral: Secondary | ICD-10-CM | POA: Diagnosis not present

## 2020-03-12 DIAGNOSIS — D3131 Benign neoplasm of right choroid: Secondary | ICD-10-CM | POA: Diagnosis not present

## 2020-10-07 ENCOUNTER — Other Ambulatory Visit: Payer: Self-pay

## 2020-10-07 ENCOUNTER — Ambulatory Visit: Payer: Medicare PPO

## 2020-10-09 ENCOUNTER — Ambulatory Visit (INDEPENDENT_AMBULATORY_CARE_PROVIDER_SITE_OTHER): Payer: Medicare PPO

## 2020-10-09 DIAGNOSIS — Z0000 Encounter for general adult medical examination without abnormal findings: Secondary | ICD-10-CM | POA: Diagnosis not present

## 2020-10-09 NOTE — Patient Instructions (Signed)
Ms. Samantha [name] , Thank you for taking time to come for your Medicare Wellness Visit. I appreciate your ongoing commitment to your health goals. Please review the following plan we discussed and let me know if I can assist you in the future.   Screening recommendations/referrals: Colonoscopy: Up to date, next due 08/03/2028 Mammogram: Patient declined Bone Density: Patient declined Recommended yearly ophthalmology/optometry visit for glaucoma screening and checkup Recommended yearly dental visit for hygiene and checkup  Vaccinations: Influenza vaccine: up to date, next due fall 2022 Pneumococcal vaccine: Completed series Tdap vaccine: Up to date, next due 10/21/2026 Shingles vaccine: Currently due for Shingrix, please contact your pharmacy to discuss cost and receive this vaccine if you are interested     Advanced directives: Please bring copies of your advanced medical directives as they have to do with your medical wishes and whom you appoint to carry out those wishes in the event that you can not make those for yourself.   Conditions/risks identified: None   Next appointment: None    Preventive Care 65 Years and Older, Female Preventive care refers to lifestyle choices and visits with your health care provider that can promote health and wellness. What does preventive care include?  A yearly physical exam. This is also called an annual well check.  Dental exams once or twice a year.  Routine eye exams. Ask your health care provider how often you should have your eyes checked.  Personal lifestyle choices, including:  Daily care of your teeth and gums.  Regular physical activity.  Eating a healthy diet.  Avoiding tobacco and drug use.  Limiting alcohol use.  Practicing safe sex.  Taking low-dose aspirin every day.  Taking vitamin and mineral supplements as recommended by your health care provider. What happens during an annual well check? The services and screenings  done by your health care provider during your annual well check will depend on your age, overall health, lifestyle risk factors, and family history of disease. Counseling  Your health care provider may ask you questions about your:  Alcohol use.  Tobacco use.  Drug use.  Emotional well-being.  Home and relationship well-being.  Sexual activity.  Eating habits.  History of falls.  Memory and ability to understand (cognition).  Work and work Statistician.  Reproductive health. Screening  You may have the following tests or measurements:  Height, weight, and BMI.  Blood pressure.  Lipid and cholesterol levels. These may be checked every 5 years, or more frequently if you are over 81 years old.  Skin check.  Lung cancer screening. You may have this screening every year starting at age 49 if you have a 30-pack-year history of smoking and currently smoke or have quit within the past 15 years.  Fecal occult blood test (FOBT) of the stool. You may have this test every year starting at age 67.  Flexible sigmoidoscopy or colonoscopy. You may have a sigmoidoscopy every 5 years or a colonoscopy every 10 years starting at age 2.  Hepatitis C blood test.  Hepatitis B blood test.  Sexually transmitted disease (STD) testing.  Diabetes screening. This is done by checking your blood sugar (glucose) after you have not eaten for a while (fasting). You may have this done every 1-3 years.  Bone density scan. This is done to screen for osteoporosis. You may have this done starting at age 72.  Mammogram. This may be done every 1-2 years. Talk to your health care provider about how often you should have  regular mammograms. Talk with your health care provider about your test results, treatment options, and if necessary, the need for more tests. Vaccines  Your health care provider may recommend certain vaccines, such as:  Influenza vaccine. This is recommended every year.  Tetanus,  diphtheria, and acellular pertussis (Tdap, Td) vaccine. You may need a Td booster every 10 years.  Zoster vaccine. You may need this after age 85.  Pneumococcal 13-valent conjugate (PCV13) vaccine. One dose is recommended after age 39.  Pneumococcal polysaccharide (PPSV23) vaccine. One dose is recommended after age 59. Talk to your health care provider about which screenings and vaccines you need and how often you need them. This information is not intended to replace advice given to you by your health care provider. Make sure you discuss any questions you have with your health care provider. Document Released: 01/08/2016 Document Revised: 08/31/2016 Document Reviewed: 10/13/2015 Elsevier Interactive Patient Education  2017 [place] Prevention in the Home Falls can cause injuries. They can happen to people of all ages. There are many things you can do to make your home safe and to help prevent falls. What can I do on the outside of my home?  Regularly fix the edges of walkways and driveways and fix any cracks.  Remove anything that might make you trip as you walk through a door, such as a raised step or threshold.  Trim any bushes or trees on the path to your home.  Use bright outdoor lighting.  Clear any walking paths of anything that might make someone trip, such as rocks or tools.  Regularly check to see if handrails are loose or broken. Make sure that both sides of any steps have handrails.  Any raised decks and porches should have guardrails on the edges.  Have any leaves, snow, or ice cleared regularly.  Use sand or salt on walking paths during winter.  Clean up any spills in your garage right away. This includes oil or grease spills. What can I do in the bathroom?  Use night lights.  Install grab bars by the toilet and in the tub and shower. Do not use towel bars as grab bars.  Use non-skid mats or decals in the tub or shower.  If you need to sit down in  the shower, use a plastic, non-slip stool.  Keep the floor dry. Clean up any water that spills on the floor as soon as it happens.  Remove soap buildup in the tub or shower regularly.  Attach bath mats securely with double-sided non-slip rug tape.  Do not have throw rugs and other things on the floor that can make you trip. What can I do in the bedroom?  Use night lights.  Make sure that you have a light by your bed that is easy to reach.  Do not use any sheets or blankets that are too big for your bed. They should not hang down onto the floor.  Have a firm chair that has side arms. You can use this for support while you get dressed.  Do not have throw rugs and other things on the floor that can make you trip. What can I do in the kitchen?  Clean up any spills right away.  Avoid walking on wet floors.  Keep items that you use a lot in easy-to-reach places.  If you need to reach something above you, use a strong step stool that has a grab bar.  Keep electrical cords out of the  way.  Do not use floor polish or wax that makes floors slippery. If you must use wax, use non-skid floor wax.  Do not have throw rugs and other things on the floor that can make you trip. What can I do with my stairs?  Do not leave any items on the stairs.  Make sure that there are handrails on both sides of the stairs and use them. Fix handrails that are broken or loose. Make sure that handrails are as long as the stairways.  Check any carpeting to make sure that it is firmly attached to the stairs. Fix any carpet that is loose or worn.  Avoid having throw rugs at the top or bottom of the stairs. If you do have throw rugs, attach them to the floor with carpet tape.  Make sure that you have a light switch at the top of the stairs and the bottom of the stairs. If you do not have them, ask someone to add them for you. What else can I do to help prevent falls?  Wear shoes that:  Do not have high  heels.  Have rubber bottoms.  Are comfortable and fit you well.  Are closed at the toe. Do not wear sandals.  If you use a stepladder:  Make sure that it is fully opened. Do not climb a closed stepladder.  Make sure that both sides of the stepladder are locked into place.  Ask someone to hold it for you, if possible.  Clearly mark and make sure that you can see:  Any grab bars or handrails.  First and last steps.  Where the edge of each step is.  Use tools that help you move around (mobility aids) if they are needed. These include:  Canes.  Walkers.  Scooters.  Crutches.  Turn on the lights when you go into a dark area. Replace any light bulbs as soon as they burn out.  Set up your furniture so you have a clear path. Avoid moving your furniture around.  If any of your floors are uneven, fix them.  If there are any pets around you, be aware of where they are.  Review your medicines with your doctor. Some medicines can make you feel dizzy. This can increase your chance of falling. Ask your doctor what other things that you can do to help prevent falls. This information is not intended to replace advice given to you by your health care provider. Make sure you discuss any questions you have with your health care provider. Document Released: 10/08/2009 Document Revised: 05/19/2016 Document Reviewed: 01/16/2015 Elsevier Interactive Patient Education  2017 [employer].

## 2020-10-09 NOTE — Progress Notes (Signed)
Subjective:   Samantha [name] is a 74 y.o. female who presents for Medicare Annual (Subsequent) preventive examination.  I connected with Wynell Balloon today by telephone and verified that I am speaking with the correct person using two identifiers. Location patient: home Location provider: work Persons participating in the virtual visit: patient, provider.   I discussed the limitations, risks, security and privacy concerns of performing an evaluation and management service by telephone and the availability of in person appointments. I also discussed with the patient that there may be a patient responsible charge related to this service. The patient expressed understanding and verbally consented to this telephonic visit.    Interactive audio and video telecommunications were attempted between this provider and patient, however failed, due to patient having technical difficulties OR patient did not have access to video capability.  We continued and completed visit with audio only.      Review of Systems    N/A        Objective:    Today's Vitals   There is no height or weight on file to calculate BMI.  Advanced Directives 10/09/2020  Does Patient Have a Medical Advance Directive? Yes  Type of Advance Directive [place]  Does patient want to make changes to medical advance directive? No - Patient declined  Copy of [place] in Chart? No - copy requested    Current Medications (verified) No outpatient encounter medications on file as of 10/09/2020.   No facility-administered encounter medications on file as of 10/09/2020.    Allergies (verified) Patient has no known allergies.   History: Past Medical History:  Diagnosis Date  . Colonic polyp    Past Surgical History:  Procedure Laterality Date  . CHOLECYSTECTOMY    . TONSILLECTOMY     Family History  Problem Relation Age of Onset  . Cancer Sister        colon - 34  . Heart  attack Mother        37  . Hypertension Mother   . Other Daughter        murdered   Social History   Socioeconomic History  . Marital status: Divorced    Spouse name: Not on file  . Number of children: Not on file  . Years of education: Not on file  . Highest education level: Not on file  Occupational History  . Not on file  Tobacco Use  . Smoking status: Never Smoker  . Smokeless tobacco: Never Used  Vaping Use  . Vaping Use: Never used  Substance and Sexual Activity  . Alcohol use: Yes    Alcohol/week: 10.0 standard drinks    Types: 10 Glasses of wine per week  . Drug use: No  . Sexual activity: Not on file  Other Topics Concern  . 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 Difficulty of Paying Living Expenses: Not hard at all  Food Insecurity: No Food Insecurity  . Worried About Charity fundraiser in the Last Year: Never true  . Ran Out of Food in the Last Year: Never true  Transportation Needs:  No Transportation Needs  . Lack of Transportation (Medical): No  . Lack of Transportation (Non-Medical): No  Physical Activity: Sufficiently Active  . Days of Exercise per Week: 7 days  . Minutes of Exercise per Session: 30 min  Stress: No Stress Concern Present  . Feeling of Stress : Not at all  Social Connections: Moderately Isolated  . Frequency of Communication with Friends and Family: More than three times a week  . Frequency of Social Gatherings with Friends and Family: More than three times a week  . Attends Religious Services: Never  . Active Member of Clubs or Organizations: Yes  . Attends Archivist Meetings: More than 4 times per year  . Marital Status: Divorced    Tobacco Counseling Counseling  given: Not Answered   Clinical Intake:  Pre-visit preparation completed: Yes  Pain : No/denies pain     Nutritional Risks: None Diabetes: No  What is the last grade level you completed in school?: Doctorate  Diabetic?No  Interpreter Needed?: No  Information entered by :: [place] of Daily Living In your present state of health, do you have any difficulty performing the following activities: 10/09/2020  Hearing? Y  Comment Has to have tv up louder  Vision? N  Difficulty concentrating or making decisions? N  Walking or climbing stairs? N  Dressing or bathing? N  Doing errands, shopping? N  Preparing Food and eating ? N  Using the Toilet? N  In the past six months, have you accidently leaked urine? N  Do you have problems with loss of bowel control? N  Managing your Medications? N  Managing your Finances? N  Housekeeping or managing your Housekeeping? N  Some recent data might be hidden    Patient Care Team: Panosh, Standley Brooking, MD as PCP - General Nahser, Wonda Cheng, MD (Cardiology) Juanita Craver, MD as Attending Physician (Gastroenterology)  Indicate any recent Medical Services you may have received from other than [facility] providers in the past year (date may be approximate).     Assessment:   This is a routine wellness examination for [place].  Hearing/Vision screen  Hearing Screening   125Hz  250Hz  500Hz  1000Hz  2000Hz  3000Hz  4000Hz  6000Hz  8000Hz   Right ear:           Left ear:           Vision Screening Comments: Patient states gets eyes checked annually    Dietary issues and exercise activities discussed: Current Exercise Habits: Home exercise routine, Type of exercise: walking, Time (Minutes): 30, Frequency (Times/Week): 7, Weekly Exercise (Minutes/Week): 210, Exercise limited by: None identified  Goals    . Patient Stated     I will continue to walk 2 miles per day      Depression Screen PHQ 2/9 Scores 10/09/2020 05/15/2018 05/03/2017 06/22/2016  03/17/2014  PHQ - 2 Score 0 0 0 0 0  PHQ- 9 Score 0 - - - -    Fall Risk Fall Risk  10/09/2020 03/06/2019 05/15/2018 05/03/2017 06/22/2016  Falls in the past year? - 0 No Yes No  Number falls in past yr: 0 0 - 1 -  Injury with Fall? 0 0 - No -  Risk for fall due to : No Fall Risks - - - -  Follow up Falls evaluation completed;Falls prevention discussed Falls evaluation completed - - -    Any stairs in or around the home? Yes  If so, are there any without handrails? No  Home free of  loose throw rugs in walkways, pet beds, electrical cords, etc? Yes  Adequate lighting in your home to reduce risk of falls? Yes   ASSISTIVE DEVICES UTILIZED TO PREVENT FALLS:  Life alert? No  Use of a cane, walker or w/c? No  Grab bars in the bathroom? Yes  Shower chair or bench in shower? No  Elevated toilet seat or a handicapped toilet? No     Cognitive Function:  Patient refused       Immunizations Immunization History  Administered Date(s) Administered  . Hep A / Hep B 01/31/2017  . Influenza Split 01/07/2013  . Influenza, High Dose Seasonal PF 10/14/2013, 10/21/2016, 10/24/2017, 08/19/2019  . Influenza,inj,Quad PF,6+ Mos 10/16/2014, 10/10/2018  . PFIZER SARS-COV-2 Vaccination 01/31/2020, 02/25/2020  . Pneumococcal Conjugate-13 03/17/2014  . Pneumococcal Polysaccharide-23 02/10/2012  . Tdap 10/21/2016  . Zoster 02/03/2010    TDAP status: Up to date Flu Vaccine status: Declined, Education has been provided regarding the importance of this vaccine but patient still declined. Advised may receive this vaccine at local pharmacy or Health Dept. Aware to provide a copy of the vaccination record if obtained from local pharmacy or Health Dept. Verbalized acceptance and understanding. Pneumococcal vaccine status: Up to date Covid-19 vaccine status: Completed vaccines  Qualifies for Shingles Vaccine? Yes   Zostavax completed Yes   Shingrix Completed?: No.    Education has been provided regarding  the importance of this vaccine. Patient has been advised to call insurance company to determine out of pocket expense if they have not yet received this vaccine. Advised may also receive vaccine at local pharmacy or Health Dept. Verbalized acceptance and understanding.  Screening Tests Health Maintenance  Topic Date Due  . Hepatitis C Screening  Never done  . DEXA SCAN  Never done  . INFLUENZA VACCINE  07/26/2020  . MAMMOGRAM  10/07/2021  . TETANUS/TDAP  10/21/2026  . COLONOSCOPY  08/03/2028  . COVID-19 Vaccine  Completed  . PNA vac Low Risk Adult  Completed    Health Maintenance  Health Maintenance Due  Topic Date Due  . Hepatitis C Screening  Never done  . DEXA SCAN  Never done  . INFLUENZA VACCINE  07/26/2020    Colorectal cancer screening: Completed 08/03/2018. Repeat every 10 years Mammogram Status: Patient refused  Bone Density   Lung Cancer Screening: (Low Dose CT Chest recommended if Age 67-80 years, 30 pack-year currently smoking OR have quit w/in 15years.) does not qualify.   Lung Cancer Screening Referral: N/A  Additional Screening:  Hepatitis C Screening: does qualify;   Vision Screening: Recommended annual ophthalmology exams for early detection of glaucoma and other disorders of the eye. Is the patient up to date with their annual eye exam?  Yes  Who is the provider or what is the name of the office in which the patient attends annual eye exams? [facility] on [address]  If pt is not established with a provider, would they like to be referred to a provider to establish care? No .   Dental Screening: Recommended annual dental exams for proper oral hygiene  Community Resource Referral / Chronic Care Management: CRR required this visit?  No   CCM required this visit?  No      Plan:     I have personally reviewed and noted the following in the patient's chart:   . Medical and social history . Use of alcohol, tobacco or illicit drugs  . Current  medications and supplements . Functional ability and status .  Nutritional status . Physical activity . Advanced directives . List of other physicians . Hospitalizations, surgeries, and ER visits in previous 12 months . Vitals . Screenings to include cognitive, depression, and falls . Referrals and appointments  In addition, I have reviewed and discussed with patient certain preventive protocols, quality metrics, and best practice recommendations. A written personalized care plan for preventive services as well as general preventive health recommendations were provided to patient.     Ofilia Neas, LPN   [id number]   Nurse Notes: None

## 2020-10-27 NOTE — Progress Notes (Signed)
Chief Complaint  Patient presents with  . Follow-up    having difficulty hearing in bilateral ear,    HPI: Samantha [name] 74 y.o. come in for  Hearing to [facility] and said need to clean out ear before  Doing evaluation  Is losing her hearing  Over time  Family noticing  To get evaluation.   Doing well lost 30  # walking and   Eating better  ROS: See pertinent positives and negatives per HPI.  Past Medical History:  Diagnosis Date  . Colonic polyp     Family History  Problem Relation Age of Onset  . Cancer Sister        colon - 52  . Heart attack Mother        14  . Hypertension Mother   . Other Daughter        murdered    Social History   Socioeconomic History  . Marital status: Divorced    Spouse name: Not on file  . Number of children: Not on file  . Years of education: Not on file  . Highest education level: Not on file  Occupational History  . Not on file  Tobacco Use  . Smoking status: Never Smoker  . Smokeless tobacco: Never Used  Vaping Use  . Vaping Use: Never used  Substance and Sexual Activity  . Alcohol use: Yes    Alcohol/week: 10.0 standard drinks    Types: 10 Glasses of wine per week  . Drug use: No  . Sexual activity: Not on file  Other Topics Concern  . 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 Difficulty of Paying Living Expenses: Not hard at all  Food Insecurity: No Food Insecurity  . Worried About Charity fundraiser in the Last Year: Never true  . Ran Out of Food in the Last Year: Never true  Transportation Needs: No Transportation Needs  . Lack of Transportation (Medical): No  . Lack of Transportation (Non-Medical): No  Physical Activity: Sufficiently Active  . Days of Exercise per  Week: 7 days  . Minutes of Exercise per Session: 30 min  Stress: No Stress Concern Present  . Feeling of Stress : Not at all  Social Connections: Moderately Isolated  . Frequency of Communication with Friends and Family: More than three times a week  . Frequency of Social Gatherings with Friends and Family: More than three times a week  . Attends Religious Services: Never  . Active Member of Clubs or Organizations: Yes  . Attends Archivist Meetings: More than 4 times per year  . Marital Status: Divorced    No outpatient medications prior to visit.   No facility-administered medications prior to visit.     EXAM:  BP 130/78 (BP Location: Right Arm, Patient Position: Sitting, Cuff Size: Normal)   Pulse 68   Temp 97.8 F (36.6 C) (Oral)   Ht 5' 1.5" (1.562 m)   Wt 169 lb (76.7 kg)   SpO2 99%   BMI 31.42 kg/m   Body mass index is 31.42 kg/m.  GENERAL: vitals reviewed and listed above, alert, oriented, appears  well hydrated and in no acute distress HEENT: atraumatic, conjunctiva  clear, no obvious abnormalities on inspection of external nose and ears  r eac nl tm clear left eac partly occulded brown wax   After 2 squirts of  Irrigation from  had pain so stopped     Lighted curretting  Cannot  Remove safely    OP :masked  NECK: no obvious masses on inspection palpation  L MS: moves all extremities without noticeable focal  abnormality PSYCH: pleasant and cooperative, no obvious depression or anxiety Lab Results  Component Value Date   WBC 8.5 04/26/2017   HGB 13.3 04/26/2017   HCT 39.6 04/26/2017   PLT 246.0 04/26/2017   GLUCOSE 86 05/15/2018   CHOL 227 (H) 05/15/2018   TRIG 214.0 (H) 05/15/2018   HDL 66.00 05/15/2018   LDLDIRECT 136.0 05/15/2018   LDLCALC 121 (H) 04/26/2017   ALT 12 05/15/2018   AST 15 05/15/2018   NA 139 05/15/2018   K 4.2 05/15/2018   CL 104 05/15/2018   CREATININE 0.66 05/15/2018   BUN 11 05/15/2018   CO2 28 05/15/2018   TSH 1.53  04/26/2017   HGBA1C 4.9 03/12/2013   BP Readings from Last 3 Encounters:  10/28/20 130/78  03/06/19 128/76  05/15/18 110/74    ASSESSMENT AND PLAN:  Discussed the following assessment and plan:  Ear pain, left - Plan: Ambulatory referral to ENT  Excessive ear wax, left - Plan: Ambulatory referral to ENT  Decreased hearing of both ears - Plan: Ambulatory referral to ENT So pain with 2 squirts of hand pump so stopped  attempted with lighted curette but too far back  No pain   Even dec hearing   More    Plan ent  To see  To remove wax and check ear pain .  Hearing dec  More after manipulation  No redness or swelling  -Patient advised to return or notify health care team  if  new concerns arise.  Patient Instructions  Samantha [name]  You are doing well   I agree audiology evaluation    Get ent  To see for wax removal .       Standley Brooking. Adib Wahba M.D.

## 2020-10-28 ENCOUNTER — Encounter: Payer: Self-pay | Admitting: Internal Medicine

## 2020-10-28 ENCOUNTER — Ambulatory Visit: Payer: Medicare PPO | Admitting: Internal Medicine

## 2020-10-28 ENCOUNTER — Other Ambulatory Visit: Payer: Self-pay

## 2020-10-28 VITALS — BP 130/78 | HR 68 | Temp 97.8°F | Ht 61.5 in | Wt 169.0 lb

## 2020-10-28 DIAGNOSIS — H9193 Unspecified hearing loss, bilateral: Secondary | ICD-10-CM | POA: Diagnosis not present

## 2020-10-28 DIAGNOSIS — H6122 Impacted cerumen, left ear: Secondary | ICD-10-CM | POA: Diagnosis not present

## 2020-10-28 DIAGNOSIS — H9202 Otalgia, left ear: Secondary | ICD-10-CM

## 2020-10-28 NOTE — Patient Instructions (Signed)
Glad  You are doing well   I agree audiology evaluation    Get ent  To see for wax removal .

## 2020-11-02 DIAGNOSIS — T162XXA Foreign body in left ear, initial encounter: Secondary | ICD-10-CM | POA: Diagnosis not present

## 2021-05-04 DIAGNOSIS — Z20822 Contact with and (suspected) exposure to covid-19: Secondary | ICD-10-CM | POA: Diagnosis not present

## 2021-09-07 ENCOUNTER — Ambulatory Visit: Payer: Medicare PPO

## 2021-10-01 ENCOUNTER — Ambulatory Visit: Payer: Medicare PPO

## 2021-10-11 ENCOUNTER — Ambulatory Visit (INDEPENDENT_AMBULATORY_CARE_PROVIDER_SITE_OTHER): Payer: Medicare PPO

## 2021-10-11 DIAGNOSIS — Z0000 Encounter for general adult medical examination without abnormal findings: Secondary | ICD-10-CM | POA: Diagnosis not present

## 2021-10-11 NOTE — Patient Instructions (Signed)
Samantha [name] , Thank you for taking time to come for your Medicare Wellness Visit. I appreciate your ongoing commitment to your health goals. Please review the following plan we discussed and let me know if I can assist you in the future.   Screening recommendations/referrals: Colonoscopy: 09/02/2018 Mammogram: declined  Bone Density: declined  Recommended yearly ophthalmology/optometry visit for glaucoma screening and checkup Recommended yearly dental visit for hygiene and checkup  Vaccinations: Influenza vaccine: due in fall in 2022  Pneumococcal vaccine: completed series  Tdap vaccine: 10/21/2016 Shingles vaccine: will consider     Advanced directives: will provide copies   Conditions/risks identified: none   Next appointment: CPE 11/08/2021  1100am  Dr. Regis Bill    Preventive Care 65 Years and Older, Female Preventive care refers to lifestyle choices and visits with your health care provider that can promote health and wellness. What does preventive care include? A yearly physical exam. This is also called an annual well check. Dental exams once or twice a year. Routine eye exams. Ask your health care provider how often you should have your eyes checked. Personal lifestyle choices, including: Daily care of your teeth and gums. Regular physical activity. Eating a healthy diet. Avoiding tobacco and drug use. Limiting alcohol use. Practicing safe sex. Taking low-dose aspirin every day. Taking vitamin and mineral supplements as recommended by your health care provider. What happens during an annual well check? The services and screenings done by your health care provider during your annual well check will depend on your age, overall health, lifestyle risk factors, and family history of disease. Counseling  Your health care provider may ask you questions about your: Alcohol use. Tobacco use. Drug use. Emotional well-being. Home and relationship well-being. Sexual  activity. Eating habits. History of falls. Memory and ability to understand (cognition). Work and work Statistician. Reproductive health. Screening  You may have the following tests or measurements: Height, weight, and BMI. Blood pressure. Lipid and cholesterol levels. These may be checked every 5 years, or more frequently if you are over 31 years old. Skin check. Lung cancer screening. You may have this screening every year starting at age 53 if you have a 30-pack-year history of smoking and currently smoke or have quit within the past 15 years. Fecal occult blood test (FOBT) of the stool. You may have this test every year starting at age 43. Flexible sigmoidoscopy or colonoscopy. You may have a sigmoidoscopy every 5 years or a colonoscopy every 10 years starting at age 85. Hepatitis C blood test. Hepatitis B blood test. Sexually transmitted disease (STD) testing. Diabetes screening. This is done by checking your blood sugar (glucose) after you have not eaten for a while (fasting). You may have this done every 1-3 years. Bone density scan. This is done to screen for osteoporosis. You may have this done starting at age 30. Mammogram. This may be done every 1-2 years. Talk to your health care provider about how often you should have regular mammograms. Talk with your health care provider about your test results, treatment options, and if necessary, the need for more tests. Vaccines  Your health care provider may recommend certain vaccines, such as: Influenza vaccine. This is recommended every year. Tetanus, diphtheria, and acellular pertussis (Tdap, Td) vaccine. You may need a Td booster every 10 years. Zoster vaccine. You may need this after age 24. Pneumococcal 13-valent conjugate (PCV13) vaccine. One dose is recommended after age 33. Pneumococcal polysaccharide (PPSV23) vaccine. One dose is recommended after age 9. Talk  to your health care provider about which screenings and vaccines  you need and how often you need them. This information is not intended to replace advice given to you by your health care provider. Make sure you discuss any questions you have with your health care provider. Document Released: 01/08/2016 Document Revised: 08/31/2016 Document Reviewed: 10/13/2015 Elsevier Interactive Patient Education  2017  Prevention in the Home Falls can cause injuries. They can happen to people of all ages. There are many things you can do to make your home safe and to help prevent falls. What can I do on the outside of my home? Regularly fix the edges of walkways and driveways and fix any cracks. Remove anything that might make you trip as you walk through a door, such as a raised step or threshold. Trim any bushes or trees on the path to your home. Use bright outdoor lighting. Clear any walking paths of anything that might make someone trip, such as rocks or tools. Regularly check to see if handrails are loose or broken. Make sure that both sides of any steps have handrails. Any raised decks and porches should have guardrails on the edges. Have any leaves, snow, or ice cleared regularly. Use sand or salt on walking paths during winter. Clean up any spills in your garage right away. This includes oil or grease spills. What can I do in the bathroom? Use night lights. Install grab bars by the toilet and in the tub and shower. Do not use towel bars as grab bars. Use non-skid mats or decals in the tub or shower. If you need to sit down in the shower, use a plastic, non-slip stool. Keep the floor dry. Clean up any water that spills on the floor as soon as it happens. Remove soap buildup in the tub or shower regularly. Attach bath mats securely with double-sided non-slip rug tape. Do not have throw rugs and other things on the floor that can make you trip. What can I do in the bedroom? Use night lights. Make sure that you have a light by your bed that  is easy to reach. Do not use any sheets or blankets that are too big for your bed. They should not hang down onto the floor. Have a firm chair that has side arms. You can use this for support while you get dressed. Do not have throw rugs and other things on the floor that can make you trip. What can I do in the kitchen? Clean up any spills right away. Avoid walking on wet floors. Keep items that you use a lot in easy-to-reach places. If you need to reach something above you, use a strong step stool that has a grab bar. Keep electrical cords out of the way. Do not use floor polish or wax that makes floors slippery. If you must use wax, use non-skid floor wax. Do not have throw rugs and other things on the floor that can make you trip. What can I do with my stairs? Do not leave any items on the stairs. Make sure that there are handrails on both sides of the stairs and use them. Fix handrails that are broken or loose. Make sure that handrails are as long as the stairways. Check any carpeting to make sure that it is firmly attached to the stairs. Fix any carpet that is loose or worn. Avoid having throw rugs at the top or bottom of the stairs. If you do have throw rugs,  attach them to the floor with carpet tape. Make sure that you have a light switch at the top of the stairs and the bottom of the stairs. If you do not have them, ask someone to add them for you. What else can I do to help prevent falls? Wear shoes that: Do not have high heels. Have rubber bottoms. Are comfortable and fit you well. Are closed at the toe. Do not wear sandals. If you use a stepladder: Make sure that it is fully opened. Do not climb a closed stepladder. Make sure that both sides of the stepladder are locked into place. Ask someone to hold it for you, if possible. Clearly mark and make sure that you can see: Any grab bars or handrails. First and last steps. Where the edge of each step is. Use tools that help you  move around (mobility aids) if they are needed. These include: Canes. Walkers. Scooters. Crutches. Turn on the lights when you go into a dark area. Replace any light bulbs as soon as they burn out. Set up your furniture so you have a clear path. Avoid moving your furniture around. If any of your floors are uneven, fix them. If there are any pets around you, be aware of where they are. Review your medicines with your doctor. Some medicines can make you feel dizzy. This can increase your chance of falling. Ask your doctor what other things that you can do to help prevent falls. This information is not intended to replace advice given to you by your health care provider. Make sure you discuss any questions you have with your health care provider. Document Released: 10/08/2009 Document Revised: 05/19/2016 Document Reviewed: 01/16/2015 Elsevier Interactive Patient Education  2017 [employer].

## 2021-10-11 NOTE — Telephone Encounter (Signed)
Please forward to the appropriate people to help her reschedule.

## 2021-10-11 NOTE — Progress Notes (Signed)
Subjective:   Samantha [name] is a 75 y.o. female who presents for Medicare Annual (Subsequent) preventive examination.   I connected with Samantha [name] today by telephone and verified that I am speaking with the correct person using two identifiers. Location patient: home Location provider: work Persons participating in the virtual visit: patient, provider.   I discussed the limitations, risks, security and privacy concerns of performing an evaluation and management service by telephone and the availability of in person appointments. I also discussed with the patient that there may be a patient responsible charge related to this service. The patient expressed understanding and verbally consented to this telephonic visit.    Interactive audio and video telecommunications were attempted between this provider and patient, however failed, due to patient having technical difficulties OR patient did not have access to video capability.  We continued and completed visit with audio only.    Review of Systems           Objective:    Today's Vitals   10/11/21 1511  PainSc: 2    There is no height or weight on file to calculate BMI.  Advanced Directives 10/11/2021 10/09/2020  Does Patient Have a Medical Advance Directive? Yes Yes  Type of Paramedic of [place];Living will [place]  Does patient want to make changes to medical advance directive? - No - Patient declined  Copy of [place] in Chart? No - copy requested No - copy requested    Current Medications (verified) No outpatient encounter medications on file as of 10/11/2021.   No facility-administered encounter medications on file as of 10/11/2021.    Allergies (verified) Patient has no known allergies.   History: Past Medical History:  Diagnosis Date   Colonic polyp    Past Surgical History:  Procedure Laterality Date   CHOLECYSTECTOMY     TONSILLECTOMY      Family History  Problem Relation Age of Onset   Cancer Sister        colon - 14   Heart attack Mother        56   Hypertension Mother    Other Daughter        murdered   Social History   Socioeconomic History   Marital status: Divorced    Spouse name: Not on file   Number of children: Not on file   Years of education: Not on file   Highest education level: Not on file  Occupational History   Not on file  Tobacco Use   Smoking status: Never   Smokeless tobacco: Never  Vaping Use   Vaping Use: Never used  Substance and Sexual Activity   Alcohol use: Yes    Alcohol/week: 10.0 standard drinks    Types: 10 Glasses of wine per week   Drug use: No   Sexual activity: Not on file  Other Topics Concern   Not on file  Social History Narrative   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   Regular exercise -  Only ocass now   [place] of 1    etoh 1-2 per week   Daughter murdered in her house   Social Determinants of Radio broadcast assistant Strain: Low Risk    Difficulty of Paying Living Expenses: Not hard at all  Food Insecurity: No Food Insecurity   Worried About Charity fundraiser in the Last Year: Never  true   Ran Out of Food in the Last Year: Never true  Transportation Needs: No Transportation Needs   Lack of Transportation (Medical): No   Lack of Transportation (Non-Medical): No  Physical Activity: Insufficiently Active   Days of Exercise per Week: 3 days   Minutes of Exercise per Session: 30 min  Stress: No Stress Concern Present   Feeling of Stress : Not at all  Social Connections: Socially Isolated   Frequency of Communication with Friends and Family: Twice a week   Frequency of Social Gatherings with Friends and Family: Twice a week   Attends Religious Services: Never   Printmaker: No   Attends Music therapist: Never   Marital Status: Divorced    Tobacco Counseling Counseling  given: Not Answered   Clinical Intake:  Pre-visit preparation completed: Yes  Pain : 0-10 Pain Score: 2  Pain Type: Chronic pain Pain Location: Hip Pain Descriptors / Indicators: Constant Pain Frequency: Constant     Nutritional Risks: None Diabetes: No  How often do you need to have someone help you when you read instructions, pamphlets, or other written materials from your doctor or pharmacy?: 1 - Never What is the last grade level you completed in school?: PHD  Diabetic?no  Interpreter Needed?: No  Information entered by :: L.Alaster Asfaw,LPN   Activities of Daily Living No flowsheet data found.  Patient Care Team: Panosh, Standley Brooking, MD as PCP - General Nahser, Wonda Cheng, MD (Cardiology) Juanita Craver, MD as Attending Physician (Gastroenterology)  Indicate any recent Medical Services you may have received from other than [facility] providers in the past year (date may be approximate).     Assessment:   This is a routine wellness examination for [place].  Hearing/Vision screen Vision Screening - Comments:: Annual eye exams wear glasses   Dietary issues and exercise activities discussed:     Goals Addressed   None    Depression Screen PHQ 2/9 Scores 10/11/2021 10/09/2020 05/15/2018 05/03/2017 06/22/2016 03/17/2014  PHQ - 2 Score 0 0 0 0 0 0  PHQ- 9 Score - 0 - - - -    Fall Risk Fall Risk  10/11/2021 10/28/2020 10/09/2020 03/06/2019 05/15/2018  Falls in the past year? 0 0 - 0 No  Number falls in past yr: 0 0 0 0 -  Injury with Fall? 0 0 0 0 -  Risk for fall due to : - - No Fall Risks - -  Follow up Falls evaluation completed - Falls evaluation completed;Falls prevention discussed Falls evaluation completed -    FALL RISK PREVENTION PERTAINING TO THE HOME:  Any stairs in or around the home? Yes  If so, are there any without handrails? No  Home free of loose throw rugs in walkways, pet beds, electrical cords, etc? Yes  Adequate lighting in your home to reduce risk of falls?  Yes   ASSISTIVE DEVICES UTILIZED TO PREVENT FALLS:  Life alert? No  Use of a cane, walker or w/c? No  Grab bars in the bathroom? No  Shower chair or bench in shower? No  Elevated toilet seat or a handicapped toilet? No    Cognitive Function:  Normal cognitive status assessed by direct observation by this Nurse Health Advisor. No abnormalities found.        Immunizations Immunization History  Administered Date(s) Administered   Hep A / Hep B 01/31/2017   Influenza Split 01/07/2013   Influenza, High Dose Seasonal PF 10/14/2013, 10/21/2016, 10/24/2017,  08/19/2019   Influenza,inj,Quad PF,6+ Mos 10/16/2014, 10/10/2018   PFIZER(Purple Top)SARS-COV-2 Vaccination 01/31/2020, 02/25/2020   Pneumococcal Conjugate-13 03/17/2014   Pneumococcal Polysaccharide-23 02/10/2012   Tdap 10/21/2016   Zoster, Live 02/03/2010    TDAP status: Up to date  Flu Vaccine status: Up to date  Pneumococcal vaccine status: Up to date  Covid-19 vaccine status: Completed vaccines  Qualifies for Shingles Vaccine? Yes   Zostavax completed No   Shingrix Completed?: No.    Education has been provided regarding the importance of this vaccine. Patient has been advised to call insurance company to determine out of pocket expense if they have not yet received this vaccine. Advised may also receive vaccine at local pharmacy or Health Dept. Verbalized acceptance and understanding.  Screening Tests Health Maintenance  Topic Date Due   Hepatitis C Screening  Never done   Zoster Vaccines- Shingrix (1 of 2) Never done   DEXA SCAN  Never done   COVID-19 Vaccine (3 - Booster for Pfizer series) 07/27/2020   MAMMOGRAM  10/07/2020   INFLUENZA VACCINE  07/26/2021   TETANUS/TDAP  10/21/2026   COLONOSCOPY (Pts 45-82yrs Insurance coverage will need to be confirmed)  08/03/2028   HPV VACCINES  Aged Out    Health Maintenance  Health Maintenance Due  Topic Date Due   Hepatitis C Screening  Never done   Zoster  Vaccines- Shingrix (1 of 2) Never done   DEXA SCAN  Never done   COVID-19 Vaccine (3 - Booster for Pfizer series) 07/27/2020   MAMMOGRAM  10/07/2020   INFLUENZA VACCINE  07/26/2021    Colorectal cancer screening: Type of screening: Colonoscopy. Completed 08/03/2018. Repeat every 10 years  Mammogram status: No longer required due to age.  Bone Density status: Ordered Patient declines . Pt provided with contact info and advised to call to schedule appt.  Lung Cancer Screening: (Low Dose CT Chest recommended if Age 8-80 years, 30 pack-year currently smoking OR have quit w/in 15years.) does not qualify.   Lung Cancer Screening Referral: n/a  Additional Screening:  Hepatitis C Screening: does qualify;   Vision Screening: Recommended annual ophthalmology exams for early detection of glaucoma and other disorders of the eye. Is the patient up to date with their annual eye exam?  Yes  Who is the provider or what is the name of the office in which the patient attends annual eye exams? [facility]  If pt is not established with a provider, would they like to be referred to a provider to establish care? No .   Dental Screening: Recommended annual dental exams for proper oral hygiene  Community Resource Referral / Chronic Care Management: CRR required this visit?  No   CCM required this visit?  No      Plan:     I have personally reviewed and noted the following in the patient's chart:   Medical and social history Use of alcohol, tobacco or illicit drugs  Current medications and supplements including opioid prescriptions.  Functional ability and status Nutritional status Physical activity Advanced directives List of other physicians Hospitalizations, surgeries, and ER visits in previous 12 months Vitals Screenings to include cognitive, depression, and falls Referrals and appointments  In addition, I have reviewed and discussed with patient certain preventive protocols,  quality metrics, and best practice recommendations. A written personalized care plan for preventive services as well as general preventive health recommendations were provided to patient.     Randel Pigg, LPN   [id number]   Nurse Notes:  none

## 2021-11-07 NOTE — Progress Notes (Signed)
Chief Complaint  Patient presents with   Annual Exam     HPI: Samantha [name] 75 y.o. comes in today for Preventive Medicare exam/ wellness visit .Since last visit.  Has done pretty well. She is no longer drinking excessive wine and is lost some weight and her sleep is much better. She does better with some right groin hip pain but no falling tries to walk.   Health Maintenance  Topic Date Due   Hepatitis C Screening  Never done   Zoster Vaccines- Shingrix (1 of 2) Never done   DEXA SCAN  Never done   COVID-19 Vaccine (3 - Booster for Pfizer series) 04/21/2020   MAMMOGRAM  10/07/2020   TETANUS/TDAP  10/21/2026   COLONOSCOPY (Pts 45-64yrs Insurance coverage will need to be confirmed)  08/03/2028   Pneumonia Vaccine 31+ Years old  Completed   INFLUENZA VACCINE  Completed   HPV VACCINES  Aged Out   Health Maintenance Review LIFESTYLE:  Exercise: Walking Tobacco/ETS:n Alcohol: n Sugar beverages:n Sleep:better Drug use: no HH:1 pet sit     Hearing: ok had  q tip in ear last fall  Vision:  No limitations at present . Last eye check UTD Safety:  Has smoke detector and wears seat belts.  No excess sun exposure. Sees dentist regularly. Falls: n Depression: No anhedonia unusual crying or depressive symptoms Nutrition: Eats well balanced diet; adequate calcium and vitamin D. No swallowing chewing problems. Injury: no major injuries in the last six months. Other healthcare providers:  Reviewed today .  Preventive parameters: up-to-date reported  ADLS:   There are no problems or need for assistance  driving, feeding, obtaining food, dressing, toileting and bathing, managing money using phone. She is independent. To travel to [place] for brother having health provlems   ROS:   REST of 12 system review negative except as per HPI   Past Medical History:  Diagnosis Date   Colonic polyp     Family History  Problem Relation Age of Onset   Cancer Sister        colon - 76    Heart attack Mother        21   Hypertension Mother    Other Daughter        murdered    Social History   Socioeconomic History   Marital status: Divorced    Spouse name: Not on file   Number of children: Not on file   Years of education: Not on file   Highest education level: Not on file  Occupational History   Not on file  Tobacco Use   Smoking status: Never   Smokeless tobacco: Never  Vaping Use   Vaping Use: Never used  Substance and Sexual Activity   Alcohol use: Yes    Alcohol/week: 10.0 standard drinks    Types: 10 Glasses of wine per week   Drug use: No   Sexual activity: Not on file  Other Topics Concern   Not on file  Social History Narrative   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   Regular exercise -  Only ocass now   [place] of 1    etoh 1-2 per week   Daughter murdered in her house   Social Determinants of Radio broadcast assistant Strain: Low Risk    Difficulty of Paying Living Expenses: Not hard at all  Food Insecurity: No Food Insecurity   Worried About  Running Out of Food in the Last Year: Never true   Ran Out of Food in the Last Year: Never true  Transportation Needs: No Transportation Needs   Lack of Transportation (Medical): No   Lack of Transportation (Non-Medical): No  Physical Activity: Insufficiently Active   Days of Exercise per Week: 3 days   Minutes of Exercise per Session: 30 min  Stress: No Stress Concern Present   Feeling of Stress : Not at all  Social Connections: Socially Isolated   Frequency of Communication with Friends and Family: Twice a week   Frequency of Social Gatherings with Friends and Family: Twice a week   Attends Religious Services: Never   Marine scientist or Organizations: No   Attends Archivist Meetings: Never   Marital Status: Divorced    No outpatient encounter medications on file as of 11/08/2021.   No facility-administered encounter medications on file  as of 11/08/2021.    EXAM:  BP 138/76 (BP Location: Left Arm, Patient Position: Sitting, Cuff Size: Normal)   Pulse 68   Temp (!) 95 F (35 C) (Temporal)   Ht 5\' 1"  (1.549 m)   Wt 175 lb 9.6 oz (79.7 kg)   SpO2 97%   BMI 33.18 kg/m   Body mass index is 33.18 kg/m.  Physical Exam: Vital signs reviewed BHA:LPFX is a well-developed well-nourished alert cooperative   who appears stated age in no acute distress.  HEENT: normocephalic atraumatic , Eyes: PERRL EOM's full, conjunctiva clear, tenderness., Ears: no deformity EAC's clear TMs with normal landmarks. Mouth: masked  NECK: supple without masses, thyromegaly or bruits. CHEST/PULM:  Clear to auscultation and percussion breath sounds equal no wheeze , rales or rhonchi. No chest wall deformities or tenderness. Breast no nodules  CV: PMI is nondisplaced, S1 S2 no gallops, murmurs, rubs. Peripheral pulses are full without delay.No JVD .  ABDOMEN: Bowel sounds normal nontender  No guard or rebound, no hepato splenomegal no CVA tenderness.   Extremtities:  No clubbing cyanosis or edema, no acute joint swelling or redness no focal atrophy discomfort right hip  NEURO:  Oriented x3, cranial nerves 3-12 appear to be intact, no obvious focal weakness,gait within normal limits no abnormal reflexes or asymmetrical SKIN: No acute rashes normal turgor, color, no bruising or petechiae.  (Hematoma thumb nail( injury a mointh ago) apepars to be growing out.  PSYCH: Oriented, good eye contact, no obvious depression anxiety, cognition and judgment appear normal. LN: no cervical axillary iadenopathy No noted deficits in memory, attention, and speech.   Lab Results  Component Value Date   WBC 8.5 04/26/2017   HGB 13.3 04/26/2017   HCT 39.6 04/26/2017   PLT 246.0 04/26/2017   GLUCOSE 86 05/15/2018   CHOL 227 (H) 05/15/2018   TRIG 214.0 (H) 05/15/2018   HDL 66.00 05/15/2018   LDLDIRECT 136.0 05/15/2018   LDLCALC 121 (H) 04/26/2017   ALT 12  05/15/2018   AST 15 05/15/2018   NA 139 05/15/2018   K 4.2 05/15/2018   CL 104 05/15/2018   CREATININE 0.66 05/15/2018   BUN 11 05/15/2018   CO2 28 05/15/2018   TSH 1.53 04/26/2017   HGBA1C 4.9 03/12/2013  Had yogurt banana and coffee this am   ASSESSMENT AND PLAN:  Discussed the following assessment and plan:  Visit for preventive health examination  Hyperlipidemia, unspecified hyperlipidemia type - Plan: Basic metabolic panel, CBC with Differential/Platelet, Hepatic function panel, Lipid panel, TSH, DG Hip Unilat W OR W/O  Pelvis 2-3 Views Right, TSH, Lipid panel, Hepatic function panel, CBC with Differential/Platelet, Basic metabolic panel  Hip pain - Plan: Basic metabolic panel, CBC with Differential/Platelet, Hepatic function panel, Lipid panel, TSH, DG Hip Unilat W OR W/O Pelvis 2-3 Views Right, TSH, Lipid panel, Hepatic function panel, CBC with Differential/Platelet, Basic metabolic panel  Elevated LDL cholesterol level - Plan: Basic metabolic panel, CBC with Differential/Platelet, Hepatic function panel, Lipid panel, TSH, DG Hip Unilat W OR W/O Pelvis 2-3 Views Right, TSH, Lipid panel, Hepatic function panel, CBC with Differential/Platelet, Basic metabolic panel  Medication management - Plan: Basic metabolic panel, CBC with Differential/Platelet, Hepatic function panel, Lipid panel, TSH, DG Hip Unilat W OR W/O Pelvis 2-3 Views Right, TSH, Lipid panel, Hepatic function panel, CBC with Differential/Platelet, Basic metabolic panel Proceed with nf labs  update  Hip x ray may need ortho consults   suspecting DJD hip   Patient Care Team: Faryal Marxen, Standley Brooking, MD as PCP - General Nahser, Wonda Cheng, MD (Cardiology) Juanita Craver, MD as Attending Physician (Gastroenterology)  Patient Instructions  Good to see you today . Hip x ray today   may see ortho if  persistent or progressive  Lab   Consider   water exercise .   Health Maintenance, Female Adopting a healthy lifestyle and getting  preventive care are important in promoting health and wellness. Ask your health care provider about: The right schedule for you to have regular tests and exams. Things you can do on your own to prevent diseases and keep yourself healthy. What should I know about diet, weight, and exercise? Eat a healthy diet  Eat a diet that includes plenty of vegetables, fruits, low-fat dairy products, and lean protein. Do not eat a lot of foods that are high in solid fats, added sugars, or sodium. Maintain a healthy weight Body mass index (BMI) is used to identify weight problems. It estimates body fat based on height and weight. Your health care provider can help determine your BMI and help you achieve or maintain a healthy weight. Get regular exercise Get regular exercise. This is one of the most important things you can do for your health. Most adults should: Exercise for at least 150 minutes each week. The exercise should increase your heart rate and make you sweat (moderate-intensity exercise). Do strengthening exercises at least twice a week. This is in addition to the moderate-intensity exercise. Spend less time sitting. Even light physical activity can be beneficial. Watch cholesterol and blood lipids Have your blood tested for lipids and cholesterol at 75 years of age, then have this test every 5 years. Have your cholesterol levels checked more often if: Your lipid or cholesterol levels are high. You are older than 75 years of age. You are at high risk for heart disease. What should I know about cancer screening? Depending on your health history and family history, you may need to have cancer screening at various ages. This may include screening for: Breast cancer. Cervical cancer. Colorectal cancer. Skin cancer. Lung cancer. What should I know about heart disease, diabetes, and high blood pressure? Blood pressure and heart disease High blood pressure causes heart disease and increases the  risk of stroke. This is more likely to develop in people who have high blood pressure readings or are overweight. Have your blood pressure checked: Every 3-5 years if you are 4-73 years of age. Every year if you are 63 years old or older. Diabetes Have regular diabetes screenings. This checks your fasting  blood sugar level. Have the screening done: Once every three years after age 52 if you are at a normal weight and have a low risk for diabetes. More often and at a younger age if you are overweight or have a high risk for diabetes. What should I know about preventing infection? Hepatitis B If you have a higher risk for hepatitis B, you should be screened for this virus. Talk with your health care provider to find out if you are at risk for hepatitis B infection. Hepatitis C Testing is recommended for: Everyone born from [birth year] through 1965. Anyone with known risk factors for hepatitis C. Sexually transmitted infections (STIs) Get screened for STIs, including gonorrhea and chlamydia, if: You are sexually active and are younger than 75 years of age. You are older than 75 years of age and your health care provider tells you that you are at risk for this type of infection. Your sexual activity has changed since you were last screened, and you are at increased risk for chlamydia or gonorrhea. Ask your health care provider if you are at risk. Ask your health care provider about whether you are at high risk for HIV. Your health care provider may recommend a prescription medicine to help prevent HIV infection. If you choose to take medicine to prevent HIV, you should first get tested for HIV. You should then be tested every 3 months for as long as you are taking the medicine. Pregnancy If you are about to stop having your period (premenopausal) and you may become pregnant, seek counseling before you get pregnant. Take 400 to 800 micrograms (mcg) of folic acid every day if you become pregnant. Ask for  birth control (contraception) if you want to prevent pregnancy. Osteoporosis and menopause Osteoporosis is a disease in which the bones lose minerals and strength with aging. This can result in bone fractures. If you are 23 years old or older, or if you are at risk for osteoporosis and fractures, ask your health care provider if you should: Be screened for bone loss. Take a calcium or vitamin D supplement to lower your risk of fractures. Be given hormone replacement therapy (HRT) to treat symptoms of menopause. Follow these instructions at home: Alcohol use Do not drink alcohol if: Your health care provider tells you not to drink. You are pregnant, may be pregnant, or are planning to become pregnant. If you drink alcohol: Limit how much you have to: 0-1 drink a day. Know how much alcohol is in your drink. In the [place] one drink equals one 12 oz bottle of beer (355 mL), one 5 oz glass of wine (148 mL), or one 1 oz glass of hard liquor (44 mL). Lifestyle Do not use any products that contain nicotine or tobacco. These products include cigarettes, chewing tobacco, and vaping devices, such as e-cigarettes. If you need help quitting, ask your health care provider. Do not use street drugs. Do not share needles. Ask your health care provider for help if you need support or information about quitting drugs. General instructions Schedule regular health, dental, and eye exams. Stay current with your vaccines. Tell your health care provider if: You often feel depressed. You have ever been abused or do not feel safe at home. Summary Adopting a healthy lifestyle and getting preventive care are important in promoting health and wellness. Follow your health care provider's instructions about healthy diet, exercising, and getting tested or screened for diseases. Follow your health care provider's instructions on monitoring your  cholesterol and blood pressure. This information is not intended to replace  advice given to you by your health care provider. Make sure you discuss any questions you have with your health care provider. Document Revised: 05/03/2021 Document Reviewed: 05/03/2021 Elsevier Patient Education  2022 [place] Lyfe Reihl M.D.

## 2021-11-08 ENCOUNTER — Ambulatory Visit (INDEPENDENT_AMBULATORY_CARE_PROVIDER_SITE_OTHER): Payer: Medicare PPO

## 2021-11-08 ENCOUNTER — Encounter: Payer: Self-pay | Admitting: Internal Medicine

## 2021-11-08 ENCOUNTER — Ambulatory Visit (INDEPENDENT_AMBULATORY_CARE_PROVIDER_SITE_OTHER): Payer: Medicare PPO | Admitting: Internal Medicine

## 2021-11-08 ENCOUNTER — Other Ambulatory Visit: Payer: Self-pay

## 2021-11-08 VITALS — BP 138/76 | HR 68 | Temp 95.0°F | Ht 61.0 in | Wt 175.6 lb

## 2021-11-08 DIAGNOSIS — M25559 Pain in unspecified hip: Secondary | ICD-10-CM

## 2021-11-08 DIAGNOSIS — E7800 Pure hypercholesterolemia, unspecified: Secondary | ICD-10-CM | POA: Diagnosis not present

## 2021-11-08 DIAGNOSIS — Z79899 Other long term (current) drug therapy: Secondary | ICD-10-CM

## 2021-11-08 DIAGNOSIS — Z0000 Encounter for general adult medical examination without abnormal findings: Secondary | ICD-10-CM | POA: Diagnosis not present

## 2021-11-08 DIAGNOSIS — M25551 Pain in right hip: Secondary | ICD-10-CM | POA: Diagnosis not present

## 2021-11-08 DIAGNOSIS — E785 Hyperlipidemia, unspecified: Secondary | ICD-10-CM | POA: Diagnosis not present

## 2021-11-08 LAB — HEPATIC FUNCTION PANEL
ALT: 20 U/L (ref 0–35)
AST: 24 U/L (ref 0–37)
Albumin: 4.1 g/dL (ref 3.5–5.2)
Alkaline Phosphatase: 88 U/L (ref 39–117)
Bilirubin, Direct: 0.2 mg/dL (ref 0.0–0.3)
Total Bilirubin: 1.4 mg/dL — ABNORMAL HIGH (ref 0.2–1.2)
Total Protein: 7.3 g/dL (ref 6.0–8.3)

## 2021-11-08 LAB — LIPID PANEL
Cholesterol: 192 mg/dL (ref 0–200)
HDL: 68.5 mg/dL (ref >39.00)
LDL Cholesterol: 102 mg/dL — ABNORMAL HIGH (ref 0–99)
NonHDL: 123.49
Total CHOL/HDL Ratio: 3
Triglycerides: 105 mg/dL (ref 0.0–149.0)
VLDL: 21 mg/dL (ref 0.0–40.0)

## 2021-11-08 LAB — CBC WITH DIFFERENTIAL/PLATELET
Basophils Absolute: 0 10*3/uL (ref 0.0–0.1)
Basophils Relative: 0.3 % (ref 0.0–3.0)
Eosinophils Absolute: 0.1 10*3/uL (ref 0.0–0.7)
Eosinophils Relative: 1 % (ref 0.0–5.0)
HCT: 39.6 % (ref 36.0–46.0)
Hemoglobin: 13.2 g/dL (ref 12.0–15.0)
Lymphocytes Relative: 25.9 % (ref 12.0–46.0)
Lymphs Abs: 2.2 10*3/uL (ref 0.7–4.0)
MCHC: 33.3 g/dL (ref 30.0–36.0)
MCV: 92.8 fl (ref 78.0–100.0)
Monocytes Absolute: 0.5 10*3/uL (ref 0.1–1.0)
Monocytes Relative: 5.5 % (ref 3.0–12.0)
Neutro Abs: 5.7 10*3/uL (ref 1.4–7.7)
Neutrophils Relative %: 67.3 % (ref 43.0–77.0)
Platelets: 278 10*3/uL (ref 150.0–400.0)
RBC: 4.27 Mil/uL (ref 3.87–5.11)
RDW: 12.8 % (ref 11.5–15.5)
WBC: 8.5 10*3/uL (ref 4.0–10.5)

## 2021-11-08 LAB — BASIC METABOLIC PANEL
BUN: 13 mg/dL (ref 6–23)
CO2: 30 mEq/L (ref 19–32)
Calcium: 9.3 mg/dL (ref 8.4–10.5)
Chloride: 102 mEq/L (ref 96–112)
Creatinine, Ser: 0.56 mg/dL (ref 0.40–1.20)
GFR: 89.51 mL/min (ref >60.00)
Glucose, Bld: 85 mg/dL (ref 70–99)
Potassium: 4.1 mEq/L (ref 3.5–5.1)
Sodium: 140 mEq/L (ref 135–145)

## 2021-11-08 LAB — TSH: TSH: 1.22 u[IU]/mL (ref 0.35–5.50)

## 2021-11-08 NOTE — Patient Instructions (Signed)
Good to see you today . Hip x ray today   may see ortho if  persistent or progressive  Lab   Consider   water exercise .   Health Maintenance, Female Adopting a healthy lifestyle and getting preventive care are important in promoting health and wellness. Ask your health care provider about: The right schedule for you to have regular tests and exams. Things you can do on your own to prevent diseases and keep yourself healthy. What should I know about diet, weight, and exercise? Eat a healthy diet  Eat a diet that includes plenty of vegetables, fruits, low-fat dairy products, and lean protein. Do not eat a lot of foods that are high in solid fats, added sugars, or sodium. Maintain a healthy weight Body mass index (BMI) is used to identify weight problems. It estimates body fat based on height and weight. Your health care provider can help determine your BMI and help you achieve or maintain a healthy weight. Get regular exercise Get regular exercise. This is one of the most important things you can do for your health. Most adults should: Exercise for at least 150 minutes each week. The exercise should increase your heart rate and make you sweat (moderate-intensity exercise). Do strengthening exercises at least twice a week. This is in addition to the moderate-intensity exercise. Spend less time sitting. Even light physical activity can be beneficial. Watch cholesterol and blood lipids Have your blood tested for lipids and cholesterol at 75 years of age, then have this test every 5 years. Have your cholesterol levels checked more often if: Your lipid or cholesterol levels are high. You are older than 75 years of age. You are at high risk for heart disease. What should I know about cancer screening? Depending on your health history and family history, you may need to have cancer screening at various ages. This may include screening for: Breast cancer. Cervical cancer. Colorectal  cancer. Skin cancer. Lung cancer. What should I know about heart disease, diabetes, and high blood pressure? Blood pressure and heart disease High blood pressure causes heart disease and increases the risk of stroke. This is more likely to develop in people who have high blood pressure readings or are overweight. Have your blood pressure checked: Every 3-5 years if you are 28-55 years of age. Every year if you are 22 years old or older. Diabetes Have regular diabetes screenings. This checks your fasting blood sugar level. Have the screening done: Once every three years after age 73 if you are at a normal weight and have a low risk for diabetes. More often and at a younger age if you are overweight or have a high risk for diabetes. What should I know about preventing infection? Hepatitis B If you have a higher risk for hepatitis B, you should be screened for this virus. Talk with your health care provider to find out if you are at risk for hepatitis B infection. Hepatitis C Testing is recommended for: Everyone born from [birth year] through 1965. Anyone with known risk factors for hepatitis C. Sexually transmitted infections (STIs) Get screened for STIs, including gonorrhea and chlamydia, if: You are sexually active and are younger than 75 years of age. You are older than 75 years of age and your health care provider tells you that you are at risk for this type of infection. Your sexual activity has changed since you were last screened, and you are at increased risk for chlamydia or gonorrhea. Ask your health care provider  if you are at risk. Ask your health care provider about whether you are at high risk for HIV. Your health care provider may recommend a prescription medicine to help prevent HIV infection. If you choose to take medicine to prevent HIV, you should first get tested for HIV. You should then be tested every 3 months for as long as you are taking the medicine. Pregnancy If you are  about to stop having your period (premenopausal) and you may become pregnant, seek counseling before you get pregnant. Take 400 to 800 micrograms (mcg) of folic acid every day if you become pregnant. Ask for birth control (contraception) if you want to prevent pregnancy. Osteoporosis and menopause Osteoporosis is a disease in which the bones lose minerals and strength with aging. This can result in bone fractures. If you are 19 years old or older, or if you are at risk for osteoporosis and fractures, ask your health care provider if you should: Be screened for bone loss. Take a calcium or vitamin D supplement to lower your risk of fractures. Be given hormone replacement therapy (HRT) to treat symptoms of menopause. Follow these instructions at home: Alcohol use Do not drink alcohol if: Your health care provider tells you not to drink. You are pregnant, may be pregnant, or are planning to become pregnant. If you drink alcohol: Limit how much you have to: 0-1 drink a day. Know how much alcohol is in your drink. In the [place] one drink equals one 12 oz bottle of beer (355 mL), one 5 oz glass of wine (148 mL), or one 1 oz glass of hard liquor (44 mL). Lifestyle Do not use any products that contain nicotine or tobacco. These products include cigarettes, chewing tobacco, and vaping devices, such as e-cigarettes. If you need help quitting, ask your health care provider. Do not use street drugs. Do not share needles. Ask your health care provider for help if you need support or information about quitting drugs. General instructions Schedule regular health, dental, and eye exams. Stay current with your vaccines. Tell your health care provider if: You often feel depressed. You have ever been abused or do not feel safe at home. Summary Adopting a healthy lifestyle and getting preventive care are important in promoting health and wellness. Follow your health care provider's instructions about  healthy diet, exercising, and getting tested or screened for diseases. Follow your health care provider's instructions on monitoring your cholesterol and blood pressure. This information is not intended to replace advice given to you by your health care provider. Make sure you discuss any questions you have with your health care provider. Document Revised: 05/03/2021 Document Reviewed: 05/03/2021 Elsevier Patient Education  [place].

## 2021-11-11 NOTE — Progress Notes (Signed)
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

## 2021-11-11 NOTE — Telephone Encounter (Signed)
Cholesterol much better than last time. If you want to look at more information for cardiovascular risk there is a $99 self-pay CT scan of the coronary system but further delineate future risk for heart attack and stroke.  In other words if it is a high calcium burden on the x-ray you may want to consider a statin medication.  If it is not elevated would not advise this.  If you are interested let us know and we can order this test.

## 2021-11-11 NOTE — Progress Notes (Signed)
Cholesterol improved from previous readings favorable ratio. Liver panel is normal or not clinically significant finding.  Rest of readings in range. Continue lifestyle intervention healthy eating and exercise .  Also   [employer]

## 2021-11-11 NOTE — Telephone Encounter (Signed)
Yes I would go back to orthopedics about the hip Sometimes injections help and sometimes not.  But would opine to them.  The total  bilirubin that is slightly elevated is not liver related because the direct bilirubin is normal there are some people who get elevations with blood disease which she do not have or fasting. I think this is clinically insignificant.

## 2022-02-11 ENCOUNTER — Encounter: Payer: Self-pay | Admitting: Internal Medicine

## 2022-02-11 DIAGNOSIS — Z7189 Other specified counseling: Secondary | ICD-10-CM

## 2022-02-14 NOTE — Telephone Encounter (Signed)
Yes

## 2022-02-18 DIAGNOSIS — H903 Sensorineural hearing loss, bilateral: Secondary | ICD-10-CM | POA: Diagnosis not present

## 2022-02-22 ENCOUNTER — Encounter: Payer: Self-pay | Admitting: Internal Medicine

## 2022-02-23 ENCOUNTER — Encounter: Payer: Self-pay | Admitting: Internal Medicine

## 2022-02-26 ENCOUNTER — Encounter: Payer: Self-pay | Admitting: Internal Medicine

## 2022-02-28 NOTE — Telephone Encounter (Signed)
Blood t ype is only ordered when getting a transfusion( hospital) ,donation of blood ,and  at childbirth or [employer]  ,  otherwise not a medical indication .   SO we wouldn't have that information   but  Colin Broach you can see if  in  lab records.

## 2022-03-07 DIAGNOSIS — H903 Sensorineural hearing loss, bilateral: Secondary | ICD-10-CM | POA: Diagnosis not present

## 2022-03-15 ENCOUNTER — Other Ambulatory Visit: Payer: Self-pay

## 2022-03-15 ENCOUNTER — Encounter: Payer: Self-pay | Admitting: Internal Medicine

## 2022-03-15 DIAGNOSIS — M25559 Pain in unspecified hip: Secondary | ICD-10-CM

## 2022-03-15 NOTE — Telephone Encounter (Signed)
So usually the specialist or Ortho surgeon orders to therapy ? ?Please advise if this is not a good  possible path ? ?Otherwise okay to refer to physical therapy for hip pain.  And strengthening. ?

## 2022-03-15 NOTE — Telephone Encounter (Signed)
Referral to PT placed

## 2022-04-01 ENCOUNTER — Encounter (HOSPITAL_BASED_OUTPATIENT_CLINIC_OR_DEPARTMENT_OTHER): Payer: Self-pay | Admitting: Physical Therapy

## 2022-04-01 ENCOUNTER — Ambulatory Visit (HOSPITAL_BASED_OUTPATIENT_CLINIC_OR_DEPARTMENT_OTHER): Payer: Medicare PPO | Attending: Internal Medicine | Admitting: Physical Therapy

## 2022-04-01 DIAGNOSIS — M25551 Pain in right hip: Secondary | ICD-10-CM | POA: Diagnosis not present

## 2022-04-01 DIAGNOSIS — M6281 Muscle weakness (generalized): Secondary | ICD-10-CM | POA: Insufficient documentation

## 2022-04-01 NOTE — Therapy (Signed)
?OUTPATIENT PHYSICAL THERAPY LOWER EXTREMITY EVALUATION ? ? ?Patient Name: Samantha [name] ?MRN: [id number] ?DOB:1946/01/16, 76 y.o., female ?Today's Date: 04/01/2022 ? ? PT End of Session - 04/01/22 1513   ? ? Visit Number 1   ? Number of Visits 13   ? Date for PT Re-Evaluation 05/13/22   ? Authorization Type [employer] MCR   ? PT Start Time 1512   ? PT Stop Time 1551   ? PT Time Calculation (min) 39 min   ? Activity Tolerance Patient tolerated treatment well   ? Behavior During Therapy [facility] for tasks assessed/performed   ? ?  ?  ? ?  ? ? ?Past Medical History:  ?Diagnosis Date  ? Colonic polyp   ? ?Past Surgical History:  ?Procedure Laterality Date  ? CHOLECYSTECTOMY    ? TONSILLECTOMY    ? ?Patient Active Problem List  ? Diagnosis Date Noted  ? Knee swelling 09/08/2014  ? Knee strain 09/08/2014  ? Edema of right lower extremity 09/08/2014  ? Estrogen deficiency 03/17/2014  ? Visit for preventive health examination 03/17/2014  ? Abdominal pain, chronic, generalized 10/10/2012  ? Obesity 02/11/2012  ? Routine gynecological examination 02/11/2012  ? Back pain, thoracic 01/20/2012  ? COLONIC POLYPS, HX OF 02/07/2010  ? BENIGN NEOPLASM OF COLON 02/03/2010  ? VITAMIN D DEFICIENCY 02/03/2010  ? Hyperlipidemia 02/03/2010  ? SLEEP DISORDER 02/03/2010  ? ? ?PCP: Burnis Medin, MD ? ?REFERRING PROVIDER: Burnis Medin, MD ? ?REFERRING DIAG:  ?  ?M25.559 (ICD-10-CM) - Hip pain  ? ? ?THERAPY DIAG:  ?Pain in right hip ? ?Muscle weakness (generalized) ? ?ONSET DATE:  about 2 yr ago ? ?SUBJECTIVE:  ? ?SUBJECTIVE STATEMENT: ?I walk daily. About 2 yr ago my hip just started bothering me. Some days is ok, some days not. Trying to avoid any hip surgeries. Lifting foot to tie shoe hurts. I love the rowing machine but have not been back to gym since [place]. I take a lot of aleve. They found arthritis on an xray. Does some stretching when she gets up.  ? ?PERTINENT HISTORY: ?none ? ?PAIN:  ?Are you having pain? Yes: NPRS scale: 6-7/10 ?Pain  location: groin and moved to all over hip ?Pain description: sharp, ache ?Aggravating factors: sitting ?Relieving factors: aleve, standing up ? ?PRECAUTIONS: None ? ?WEIGHT BEARING RESTRICTIONS No ? ?FALLS:  ?Has patient fallen in last 6 months? No ? ?LIVING ENVIRONMENT: ?Lives with: lives alone ?Lives in: House/apartment ?Stairs: yes, bedroom is upstairs ?Has following equipment at home: walking stick that I use on trips ? ?OCCUPATION: retired ? ?PLOF: Independent ? ?PATIENT GOALS what to do at gym, tie shoes without pain ? ? ?OBJECTIVE:  ? ?DIAGNOSTIC FINDINGS: Xray FINDINGS: ?Pelvic ring is intact. Degenerative changes of right hip joint are ?noted with sclerosis and subchondral cyst formation. No acute ?fracture or dislocation is noted. No soft tissue changes noted. ?  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 No acute abnormality is ?noted. ? ?PATIENT SURVEYS:  ?LEFS 39 ? ?COGNITION: ? Overall cognitive status: Within functional limits for tasks assessed   ?  ?SENSATION: ?WFL ? ?MUSCLE LENGTH: ?Bilat HS flexibility to approx 40 deg  ? ?POSTURE:  ?Anterior pelvic tilt with slight trunk flexion ? ?LE ROM: ?Rt Passive hip flexion to 90, painful & empty end feel ? ? ?LE MMT: lack of strength noted in CKC/stance phase of walking ? ?MMT Right ?04/01/2022 Left ?04/01/2022  ?Hip flexion    ?Hip extension    ?  Hip abduction    ?Hip adduction    ?Hip internal rotation    ?Hip external rotation    ?Knee flexion    ?Knee extension    ?Ankle dorsiflexion    ?Ankle plantarflexion    ?Ankle inversion    ?Ankle eversion    ? (Blank rows = not tested) ? ? ?GAIT: ?Anterior pelvic tilt, trunk sidebend to Rt in right stance phase, minimal hip extension bilaterally- denies pain in walking today ? ? ? ? ?TODAY'S TREATMENT: ?EVAL: ?Long axis distraction Rt LE ?Clam ?Lt leg on step- rt leg hang for distraction ?Seated HSS ? ? ?PATIENT EDUCATION:  ?Education details: Anatomy of condition, POC, HEP, exercise  form/rationale ? ?Person educated: Patient ?Education method: Explanation, Demonstration, Tactile cues, Verbal cues, and Handouts ?Education comprehension: verbalized understanding, returned demonstration, verbal cues required, tactile cues required, and needs further education ? ? ?HOME EXERCISE PROGRAM: ?   [id number] ?Left foot on step and right leg hanging ? ?ASSESSMENT: ? ?CLINICAL IMPRESSION: ?Patient is a 76 y.o. F who was seen today for physical therapy evaluation and treatment for chronic right hip pain.  ? ? ?OBJECTIVE IMPAIRMENTS Abnormal gait, decreased activity tolerance, decreased ROM, decreased strength, increased muscle spasms, impaired flexibility, improper body mechanics, postural dysfunction, and pain.  ? ?ACTIVITY LIMITATIONS cleaning, driving, and dressing .  ? ?PERSONAL FACTORS Fitness and Time since onset of injury/illness/exacerbation are also affecting patient's functional outcome.  ? ? ?REHAB POTENTIAL: Good ? ?CLINICAL DECISION MAKING: Stable/uncomplicated ? ?EVALUATION COMPLEXITY: Low ? ? ?GOALS: ?Goals reviewed with patient? Yes ? ?SHORT TERM GOALS: Target date: 04/22/2022 ? ?Able to demo level pelvis in CKC ?Baseline: ?Goal status: INITIAL ? ? ?LONG TERM GOALS: Target date: 05/13/2022 ? ?Pt will don shoes without hip pain ?Baseline:  ?Goal status: INITIAL ? ?2.  Bil HS flexibility to at least 75 deg ?Baseline:  ?Goal status: INITIAL ? ?3.  LEFS to improve by 9 point [place] ?Baseline:  ?Goal status: INITIAL ? ?4.  Independent in gym program for long term strength and endurance ?Baseline:  ?Goal status: INITIAL ? ? ?PLAN: ?PT FREQUENCY: 2x/week ? ?PT DURATION: 6 weeks ? ?PLANNED INTERVENTIONS: Therapeutic exercises, Therapeutic activity, Neuromuscular re-education, Balance training, Gait training, Patient/Family education, Joint mobilization, Stair training, Aquatic Therapy, Dry Needling, Spinal mobilization, Cryotherapy, Moist heat, Taping, and Manual therapy ? ?PLAN FOR NEXT SESSION: continue  joint distraction & gross core/hip strength ? ? ?Referring diagnosis? M25.559 ?Treatment diagnosis? (if different than referring diagnosis) M25.551, m62.81 ?What was this (referring dx) caused by? ?'[]'$  Surgery ?'[]'$  Fall ?'[]'$  Ongoing issue ?'[x]'$  Arthritis ?'[]'$  Other: ____________ ? ?Laterality: ?'[x]'$  Rt ?'[]'$  Lt ?'[]'$  Both ? ?Check all possible CPT codes:  *CHOOSE 10 OR LESS*    ?'[]'$  97110 (Therapeutic Exercise)  '[]'$  92507 (SLP Treatment)  ?'[]'$  [id number] (Neuro Re-ed)   '[]'$  92526 (Swallowing Treatment)  ? '[]'$  [id number] (Gait Training)   '[]'$  [id number] (Cognitive Training, 1st 15 minutes) ?'[]'$  97140 (Manual Therapy)   '[]'$  97130 (Cognitive Training, each add'l 15 minutes)  ?'[]'$  [phone] (Re-evaluation)                              '[]'$  Other, List CPT Code ____________  ?'[]'$  56387 (Therapeutic Activities)     ?'[]'$  97535 (Self Care)  ? '[x]'$  All codes above (97110 - 97535) ? '[]'$  [id number] (Mechanical Traction) ? '[]'$  97014 (E-stim Unattended) ? '[]'$  97032 (E-stim manual) ? '[]'$  56433 (Ionto) ? '[]'$  [phone] (  Ultrasound) ? '[]'$  [phone] Therapist, art) ?'[]'$  16429 (Physical Performance Training) ?'[x]'$  [id number] (Aquatic Therapy) ?'[]'$  03795 (Contrast Bath) ?'[]'$  [id number] (Paraffin) ?'[]'$  97597 (Wound Care 1st 20 sq cm) ?'[]'$  97598 (Wound Care each add'l 20 sq cm) ?'[]'$  [id number] (Vasopneumatic Device) ?'[]'$  [id number] Comptroller) ?'[]'$  [phone] (Prosthetic Training) ?Nyasiah Moffet C. Genese Quebedeaux PT, DPT ?04/01/22 8:28 PM ? ? ?

## 2022-04-02 ENCOUNTER — Encounter: Payer: Self-pay | Admitting: Physician Assistant

## 2022-04-05 ENCOUNTER — Ambulatory Visit (HOSPITAL_BASED_OUTPATIENT_CLINIC_OR_DEPARTMENT_OTHER): Payer: Medicare PPO | Admitting: Physical Therapy

## 2022-04-05 ENCOUNTER — Encounter (HOSPITAL_BASED_OUTPATIENT_CLINIC_OR_DEPARTMENT_OTHER): Payer: Self-pay | Admitting: Physical Therapy

## 2022-04-05 DIAGNOSIS — M6281 Muscle weakness (generalized): Secondary | ICD-10-CM

## 2022-04-05 DIAGNOSIS — M25551 Pain in right hip: Secondary | ICD-10-CM

## 2022-04-05 NOTE — Therapy (Signed)
?OUTPATIENT PHYSICAL THERAPY TREATMENT NOTE ? ? ?Patient Name: Samantha [name] ?MRN: [id number] ?DOB:1946/04/21, 76 y.o., female ?Today's Date: 04/05/2022 ? ?PCP: Burnis Medin, MD ?REFERRING PROVIDER: Burnis Medin, MD ? ?END OF SESSION:  ? PT End of Session - 04/05/22 1422   ? ? Visit Number 2   ? Number of Visits 13   ? Date for PT Re-Evaluation 05/13/22   ? Authorization Type [employer] MCR   ? PT Start Time 1426   ? PT Stop Time 6606   ? PT Time Calculation (min) 45 min   ? Activity Tolerance Patient tolerated treatment well   ? Behavior During Therapy [facility] for tasks assessed/performed   ? ?  ?  ? ?  ? ? ?Past Medical History:  ?Diagnosis Date  ? Colonic polyp   ? ?Past Surgical History:  ?Procedure Laterality Date  ? CHOLECYSTECTOMY    ? TONSILLECTOMY    ? ?Patient Active Problem List  ? Diagnosis Date Noted  ? Knee swelling 09/08/2014  ? Knee strain 09/08/2014  ? Edema of right lower extremity 09/08/2014  ? Estrogen deficiency 03/17/2014  ? Visit for preventive health examination 03/17/2014  ? Abdominal pain, chronic, generalized 10/10/2012  ? Obesity 02/11/2012  ? Routine gynecological examination 02/11/2012  ? Back pain, thoracic 01/20/2012  ? COLONIC POLYPS, HX OF 02/07/2010  ? BENIGN NEOPLASM OF COLON 02/03/2010  ? VITAMIN D DEFICIENCY 02/03/2010  ? Hyperlipidemia 02/03/2010  ? SLEEP DISORDER 02/03/2010  ? ? ?REFERRING DIAG: M25.559 (ICD-10-CM) - Hip pain ? ?THERAPY DIAG:  ?Pain in right hip ? ?Muscle weakness (generalized) ? ?PERTINENT HISTORY: none ? ?PRECAUTIONS: none ? ?SUBJECTIVE: my hip is fine but all my other muscles are hurting. Standing on Left leg for distraction is hurting Lt knee. I walked 45 min and had hip pain.  ? ?PAIN:  ?Are you having pain? No ? ? ?OBJECTIVE:  ?  ?DIAGNOSTIC FINDINGS: Xray FINDINGS: ?Pelvic ring is intact. Degenerative changes of right hip joint are ?noted with sclerosis and subchondral cyst formation. No acute ?fracture or dislocation is noted. No soft tissue changes  noted. ?  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 No acute abnormality is ?noted. ?  ?PATIENT SURVEYS:  ?LEFS 39 ?  ?COGNITION: ?          Overall cognitive status: Within functional limits for tasks assessed               ?           ?SENSATION: ?WFL ?  ?MUSCLE LENGTH: ?Bilat HS flexibility to approx 40 deg  ?  ?POSTURE:  ?Anterior pelvic tilt with slight trunk flexion ?  ?LE ROM: ?Rt Passive hip flexion to 90, painful & empty end feel ?  ?  ?LE MMT: lack of strength noted in CKC/stance phase of walking ?  ?MMT Right ?04/01/2022 Left ?04/01/2022  ?Hip flexion      ?Hip extension      ?Hip abduction      ?Hip adduction      ?Hip internal rotation      ?Hip external rotation      ?Knee flexion      ?Knee extension      ?Ankle dorsiflexion      ?Ankle plantarflexion      ?Ankle inversion      ?Ankle eversion      ? (Blank rows = not tested) ?  ?  ?GAIT: ?Anterior pelvic tilt, trunk sidebend to Rt in  right stance phase, minimal hip extension bilaterally- denies pain in walking today ?  ?  ?  ?  ?TODAY'S TREATMENT: ?4/11: ?Long axis & lateral distractions 3*30s ?Clam x15 ?Sidelying hip abd x15 ?Supine clam green tband ?Bridges green tband ?Standing glut set ?Tandem rocking with glut set at hip ext ?Gait training: glut set, trunk rotation, fast and slow speeds ? ?EVAL: ?Long axis distraction Rt LE ?Clam ?Lt leg on step- rt leg hang for distraction ?Seated HSS ?  ?  ?PATIENT EDUCATION:  ?Education details: Anatomy of condition, POC, HEP, exercise form/rationale ?  ?Person educated: Patient ?Education method: Explanation, Demonstration, Tactile cues, Verbal cues, and Handouts ?Education comprehension: verbalized understanding, returned demonstration, verbal cues required, tactile cues required, and needs further education ?  ?  ?HOME EXERCISE PROGRAM: ?   [id number] ?Left foot on step and right leg hanging ?  ?ASSESSMENT: ?  ?CLINICAL IMPRESSION: ?Encouraged her to time her shorter walks to determine  tolerance. With correction of gait pattern, noticed a consistent clicking that the pt could not feel so we think it might have been her shoes. Will keep an eye on this.  ?  ?  ?OBJECTIVE IMPAIRMENTS Abnormal gait, decreased activity tolerance, decreased ROM, decreased strength, increased muscle spasms, impaired flexibility, improper body mechanics, postural dysfunction, and pain.  ?  ?ACTIVITY LIMITATIONS cleaning, driving, and dressing .  ?  ?PERSONAL FACTORS Fitness and Time since onset of injury/illness/exacerbation are also affecting patient's functional outcome.  ?  ?  ?REHAB POTENTIAL: Good ?  ?CLINICAL DECISION MAKING: Stable/uncomplicated ?  ?EVALUATION COMPLEXITY: Low ?  ?  ?GOALS: ?Goals reviewed with patient? Yes ?  ?SHORT TERM GOALS: Target date: 04/22/2022 ?  ?Able to demo level pelvis in CKC ?Baseline: ?Goal status: INITIAL ?  ?  ?LONG TERM GOALS: Target date: 05/13/2022 ?  ?Pt will don shoes without hip pain ?Baseline:  ?Goal status: INITIAL ?  ?2.  Bil HS flexibility to at least 75 deg ?Baseline:  ?Goal status: INITIAL ?  ?3.  LEFS to improve by 9 point [place] ?Baseline:  ?Goal status: INITIAL ?  ?4.  Independent in gym program for long term strength and endurance ?Baseline:  ?Goal status: INITIAL ?  ?  ?PLAN: ?PT FREQUENCY: 2x/week ?  ?PT DURATION: 6 weeks ?  ?PLANNED INTERVENTIONS: Therapeutic exercises, Therapeutic activity, Neuromuscular re-education, Balance training, Gait training, Patient/Family education, Joint mobilization, Stair training, Aquatic Therapy, Dry Needling, Spinal mobilization, Cryotherapy, Moist heat, Taping, and Manual therapy ?  ?PLAN FOR NEXT SESSION: continue joint distraction, progress gross glut strength, review gait pattern ?  ? ? ? ?Braelyn Jenson C. Caasi Giglia PT, DPT ?04/05/22 3:13 PM ? ?  ? ?

## 2022-04-06 ENCOUNTER — Encounter (HOSPITAL_BASED_OUTPATIENT_CLINIC_OR_DEPARTMENT_OTHER): Payer: Self-pay | Admitting: Physical Therapy

## 2022-04-06 ENCOUNTER — Ambulatory Visit (HOSPITAL_BASED_OUTPATIENT_CLINIC_OR_DEPARTMENT_OTHER): Payer: Medicare PPO | Admitting: Physical Therapy

## 2022-04-06 DIAGNOSIS — M6281 Muscle weakness (generalized): Secondary | ICD-10-CM | POA: Diagnosis not present

## 2022-04-06 DIAGNOSIS — M25551 Pain in right hip: Secondary | ICD-10-CM

## 2022-04-06 NOTE — Therapy (Signed)
?OUTPATIENT PHYSICAL THERAPY TREATMENT NOTE ? ? ?Patient Name: Samantha [name] ?MRN: [id number] ?DOB:07-06-1946, 76 y.o., female ?Today's Date: 04/06/2022 ? ?PCP: Burnis Medin, MD ?REFERRING PROVIDER: Burnis Medin, MD ? ?END OF SESSION:  ? PT End of Session - 04/06/22 1413   ? ? Visit Number 3   ? Number of Visits 13   ? Date for PT Re-Evaluation 05/13/22   ? Authorization Type [employer] MCR   ? PT Start Time 1315   ? PT Stop Time 1402   ? PT Time Calculation (min) 47 min   ? Activity Tolerance Patient tolerated treatment well   ? Behavior During Therapy [facility] for tasks assessed/performed   ? ?  ?  ? ?  ? ? ? ?Past Medical History:  ?Diagnosis Date  ? Colonic polyp   ? ?Past Surgical History:  ?Procedure Laterality Date  ? CHOLECYSTECTOMY    ? TONSILLECTOMY    ? ?Patient Active Problem List  ? Diagnosis Date Noted  ? Knee swelling 09/08/2014  ? Knee strain 09/08/2014  ? Edema of right lower extremity 09/08/2014  ? Estrogen deficiency 03/17/2014  ? Visit for preventive health examination 03/17/2014  ? Abdominal pain, chronic, generalized 10/10/2012  ? Obesity 02/11/2012  ? Routine gynecological examination 02/11/2012  ? Back pain, thoracic 01/20/2012  ? COLONIC POLYPS, HX OF 02/07/2010  ? BENIGN NEOPLASM OF COLON 02/03/2010  ? VITAMIN D DEFICIENCY 02/03/2010  ? Hyperlipidemia 02/03/2010  ? SLEEP DISORDER 02/03/2010  ? ? ?REFERRING DIAG: M25.559 (ICD-10-CM) - Hip pain ? ?THERAPY DIAG:  ?Pain in right hip ? ?Muscle weakness (generalized) ? ?PERTINENT HISTORY: none ? ?PRECAUTIONS: none ? ?SUBJECTIVE:  ?Pt states mild soreness in hip with increased walking, and with sitting. Reports weakness with stairs and walking.  ? ? ?PAIN:  ?Are you having pain? Yes: NPRS scale: 3/10 ?Pain location: R hip ?Pain description: Achy ?Aggravating factors: stairs, walking, sitting ?Relieving factors: none stated  ? ? ?OBJECTIVE:  ?  ?DIAGNOSTIC FINDINGS: Xray FINDINGS: ?Pelvic ring is intact. Degenerative changes of right hip joint  are ?noted with sclerosis and subchondral cyst formation. No acute ?fracture or dislocation is noted. No soft tissue changes noted. ?  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 No acute abnormality is ?noted. ?  ?PATIENT SURVEYS:  ?LEFS 39 ?  ?COGNITION: ?          Overall cognitive status: Within functional limits for tasks assessed               ?           ?SENSATION: ?WFL ?  ?MUSCLE LENGTH: ?Bilat HS flexibility to approx 40 deg  ?  ?POSTURE:  ?Anterior pelvic tilt with slight trunk flexion ?  ?LE ROM: ?Rt Passive hip flexion to 90, painful & empty end feel ?  ?  ?LE MMT: lack of strength noted in CKC/stance phase of walking ?  ?MMT Right ?04/01/2022 Left ?04/01/2022  ?Hip flexion      ?Hip extension      ?Hip abduction      ?Hip adduction      ?Hip internal rotation      ?Hip external rotation      ?Knee flexion      ?Knee extension      ?Ankle dorsiflexion      ?Ankle plantarflexion      ?Ankle inversion      ?Ankle eversion      ? (Blank rows = not tested) ?  ?  ?  GAIT: ?Anterior pelvic tilt, trunk sidebend to Rt in right stance phase, minimal hip extension bilaterally- denies pain in walking today ?  ?  ?  ?  ?TODAY'S TREATMENT: ? ?04/06/22: ?Ther Ex: ?Bike: L1 x 6 min  ? ?Standing:  ?Hip abd 2x10 bil;  March x 20 slow;  Tandem rocking with glut set at hip ext x 20 bil;  ? ?Supine:  ?Clam ER for ROM/stretch 10 sec x 10;  ?Bridges x20 green tband ? ?Manual:  ?Long axis distraction ; R hip inf and post mobs; PROM For flex and ER;  STM to R adductor  ? ? ? ?4/11: ?Long axis & lateral distractions 3*30s ?Clam x15 ?Sidelying hip abd x15 ?Supine clam green tband ?Bridges green tband ?Standing glut set ?Tandem rocking with glut set at hip ext ?Gait training: glut set, trunk rotation, fast and slow speeds ? ?EVAL: ?Long axis distraction Rt LE ?Clam ?Lt leg on step- rt leg hang for distraction ?Seated HSS ?  ?  ?PATIENT EDUCATION:  ?Education details: Anatomy of condition, POC, HEP, exercise  form/rationale ?  ?Person educated: Patient ?Education method: Explanation, Demonstration, Tactile cues, Verbal cues, and Handouts ?Education comprehension: verbalized understanding, returned demonstration, verbal cues required, tactile cues required, and needs further education ?  ?  ?HOME EXERCISE PROGRAM: ?   [id number] ?Left foot on step and right leg hanging ?  ?ASSESSMENT: ?  ?CLINICAL IMPRESSION: ?Pt with noted soreness in adductor today, very tender with palpation and with light STM today. She has stiffness in R hip joint that is limiting ROM, will benefit from continued mobilization, and progression of strengthening as able. Pt with muscle fatigue with standing ther ex today.  ?  ?OBJECTIVE IMPAIRMENTS Abnormal gait, decreased activity tolerance, decreased ROM, decreased strength, increased muscle spasms, impaired flexibility, improper body mechanics, postural dysfunction, and pain.  ?  ?ACTIVITY LIMITATIONS cleaning, driving, and dressing .  ?  ?PERSONAL FACTORS Fitness and Time since onset of injury/illness/exacerbation are also affecting patient's functional outcome.  ?  ?  ?REHAB POTENTIAL: Good ?  ?CLINICAL DECISION MAKING: Stable/uncomplicated ?  ?EVALUATION COMPLEXITY: Low ?  ?  ?GOALS: ?Goals reviewed with patient? Yes ?  ?SHORT TERM GOALS: Target date: 04/22/2022 ?  ?Able to demo level pelvis in CKC ?Baseline: ?Goal status: INITIAL ?  ?  ?LONG TERM GOALS: Target date: 05/13/2022 ?  ?Pt will don shoes without hip pain ?Baseline:  ?Goal status: INITIAL ?  ?2.  Bil HS flexibility to at least 75 deg ?Baseline:  ?Goal status: INITIAL ?  ?3.  LEFS to improve by 9 point [place] ?Baseline:  ?Goal status: INITIAL ?  ?4.  Independent in gym program for long term strength and endurance ?Baseline:  ?Goal status: INITIAL ?  ?  ?PLAN: ?PT FREQUENCY: 2x/week ?  ?PT DURATION: 6 weeks ?  ?PLANNED INTERVENTIONS: Therapeutic exercises, Therapeutic activity, Neuromuscular re-education, Balance training, Gait training,  Patient/Family education, Joint mobilization, Stair training, Aquatic Therapy, Dry Needling, Spinal mobilization, Cryotherapy, Moist heat, Taping, and Manual therapy ?  ?PLAN FOR NEXT SESSION: continue joint distraction, progress gross glut strength, review gait pattern ?  ? ? ? ?Lyndee Hensen, PT, DPT ?2:14 PM  04/06/22 ? ? ?  ? ?

## 2022-04-12 ENCOUNTER — Encounter (HOSPITAL_BASED_OUTPATIENT_CLINIC_OR_DEPARTMENT_OTHER): Payer: Self-pay | Admitting: Physical Therapy

## 2022-04-12 ENCOUNTER — Ambulatory Visit (HOSPITAL_BASED_OUTPATIENT_CLINIC_OR_DEPARTMENT_OTHER): Payer: Medicare PPO | Admitting: Physical Therapy

## 2022-04-12 DIAGNOSIS — M25551 Pain in right hip: Secondary | ICD-10-CM | POA: Diagnosis not present

## 2022-04-12 DIAGNOSIS — M6281 Muscle weakness (generalized): Secondary | ICD-10-CM

## 2022-04-12 NOTE — Therapy (Signed)
?OUTPATIENT PHYSICAL THERAPY TREATMENT NOTE ? ? ?Patient Name: Samantha [name] ?MRN: [id number] ?DOB:[birth year], 76 y.o., female ?Today's Date: 04/12/2022 ? ?PCP: Burnis Medin, MD ?REFERRING PROVIDER: Burnis Medin, MD ? ?END OF SESSION:  ? PT End of Session - 04/12/22 1024   ? ? Visit Number 4   ? Number of Visits 13   ? Date for PT Re-Evaluation 05/13/22   ? Authorization Type [employer] MCR   ? PT Start Time 1016   ? PT Stop Time 1055   ? PT Time Calculation (min) 39 min   ? Activity Tolerance Patient tolerated treatment well   ? Behavior During Therapy [facility] for tasks assessed/performed   ? ?  ?  ? ?  ? ? ? ? ?Past Medical History:  ?Diagnosis Date  ? Colonic polyp   ? ?Past Surgical History:  ?Procedure Laterality Date  ? CHOLECYSTECTOMY    ? TONSILLECTOMY    ? ?Patient Active Problem List  ? Diagnosis Date Noted  ? Knee swelling 09/08/2014  ? Knee strain 09/08/2014  ? Edema of right lower extremity 09/08/2014  ? Estrogen deficiency 03/17/2014  ? Visit for preventive health examination 03/17/2014  ? Abdominal pain, chronic, generalized 10/10/2012  ? Obesity 02/11/2012  ? Routine gynecological examination 02/11/2012  ? Back pain, thoracic 01/20/2012  ? COLONIC POLYPS, HX OF 02/07/2010  ? BENIGN NEOPLASM OF COLON 02/03/2010  ? VITAMIN D DEFICIENCY 02/03/2010  ? Hyperlipidemia 02/03/2010  ? SLEEP DISORDER 02/03/2010  ? ? ?REFERRING DIAG: M25.559 (ICD-10-CM) - Hip pain ? ?THERAPY DIAG:  ?Pain in right hip ? ?Muscle weakness (generalized) ? ?PERTINENT HISTORY: none ? ?PRECAUTIONS: none ? ?SUBJECTIVE:  ?Trying to take less aleve, my whole body hurts. I feel the pull on the front of my hip when I straighten my leg. I tend to lead with my Left leg on stiars ? ? ?PAIN:  ?Are you having pain? Yes: NPRS scale: 2/10 ?Pain location: R hip ?Pain description: Achy ?Aggravating factors: stairs, walking, sitting ?Relieving factors: none stated  ? ? ?OBJECTIVE:  ?  ?DIAGNOSTIC FINDINGS: Xray FINDINGS: ?Pelvic ring is intact.  Degenerative changes of right hip joint are ?noted with sclerosis and subchondral cyst formation. No acute ?fracture or dislocation is noted. No soft tissue changes noted. ?  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 No acute abnormality is ?noted. ?  ?PATIENT SURVEYS:  ?LEFS 39 ?  ?COGNITION: ?          Overall cognitive status: Within functional limits for tasks assessed               ?           ?SENSATION: ?WFL ?  ?MUSCLE LENGTH: ?Bilat HS flexibility to approx 40 deg  ?  ?POSTURE:  ?Anterior pelvic tilt with slight trunk flexion ?  ?LE ROM: ?Rt Passive hip flexion to 90, painful & empty end feel ?  ?  ?LE MMT: lack of strength noted in CKC/stance phase of walking ?  ?MMT Right ?04/01/2022 Left ?04/01/2022  ?Hip flexion      ?Hip extension      ?Hip abduction      ?Hip adduction      ?Hip internal rotation      ?Hip external rotation      ?Knee flexion      ?Knee extension      ?Ankle dorsiflexion      ?Ankle plantarflexion      ?Ankle inversion      ?  Ankle eversion      ? (Blank rows = not tested) ?  ?  ?GAIT: ?Anterior pelvic tilt, trunk sidebend to Rt in right stance phase, minimal hip extension bilaterally- denies pain in walking today ?  ?  ?  ?  ?TODAY'S TREATMENT: ?4/18: ?THERACT- stair training- forward weight shift and gluts to come upright ? ?THEREX:  ?mod thomas stretch ?Bridge 10x 3s holds, arms across chest ?Sidelying abd + hip ext with knee flexed ?3lb LAQ with slow, eccentric motion ?Sit<>stand with hip hinge and glut set ?Seated HSS ? ?MANUAL: roller to Rt quads ? ? ?04/06/22: ?Ther Ex: ?Bike: L1 x 6 min  ? ?Standing:  ?Hip abd 2x10 bil;  March x 20 slow;  Tandem rocking with glut set at hip ext x 20 bil;  ? ?Supine:  ?Clam ER for ROM/stretch 10 sec x 10;  ?Bridges x20 green tband ? ?Manual:  ?Long axis distraction ; R hip inf and post mobs; PROM For flex and ER;  STM to R adductor  ? ? ? ?4/11: ?Long axis & lateral distractions 3*30s ?Clam x15 ?Sidelying hip abd x15 ?Supine clam  green tband ?Bridges green tband ?Standing glut set ?Tandem rocking with glut set at hip ext ?Gait training: glut set, trunk rotation, fast and slow speeds ? ?  ?  ?PATIENT EDUCATION:  ?Education details: Anatomy of condition, POC, HEP, exercise form/rationale ?  ?Person educated: Patient ?Education method: Explanation, Demonstration, Tactile cues, Verbal cues, and Handouts ?Education comprehension: verbalized understanding, returned demonstration, verbal cues required, tactile cues required, and needs further education ?  ?  ?HOME EXERCISE PROGRAM: ?   [id number] ?Left foot on step and right leg hanging ?  ?ASSESSMENT: ?  ?CLINICAL IMPRESSION: ?Tightness in right quads and hip flexors notable. Tendency to avoid weight shift to Rt foot in ascending stairs and hopping/pulling with UE, also demo poor eccentric control in descending stairs. Feels winded with exercises and will cont to challenge endurance.  ? ?OBJECTIVE IMPAIRMENTS Abnormal gait, decreased activity tolerance, decreased ROM, decreased strength, increased muscle spasms, impaired flexibility, improper body mechanics, postural dysfunction, and pain.  ?  ?ACTIVITY LIMITATIONS cleaning, driving, and dressing .  ?  ?PERSONAL FACTORS Fitness and Time since onset of injury/illness/exacerbation are also affecting patient's functional outcome.  ?  ?  ?REHAB POTENTIAL: Good ?  ?CLINICAL DECISION MAKING: Stable/uncomplicated ?  ?EVALUATION COMPLEXITY: Low ?  ?  ?GOALS: ?Goals reviewed with patient? Yes ?  ?SHORT TERM GOALS: Target date: 04/22/2022 ?  ?Able to demo level pelvis in CKC ?Baseline: ?Goal status: INITIAL ?  ?  ?LONG TERM GOALS: Target date: 05/13/2022 ?  ?Pt will don shoes without hip pain ?Baseline:  ?Goal status: INITIAL ?  ?2.  Bil HS flexibility to at least 75 deg ?Baseline:  ?Goal status: INITIAL ?  ?3.  LEFS to improve by 9 point [place] ?Baseline:  ?Goal status: INITIAL ?  ?4.  Independent in gym program for long term strength and endurance ?Baseline:   ?Goal status: INITIAL ?  ?  ?PLAN: ?PT FREQUENCY: 2x/week ?  ?PT DURATION: 6 weeks ?  ?PLANNED INTERVENTIONS: Therapeutic exercises, Therapeutic activity, Neuromuscular re-education, Balance training, Gait training, Patient/Family education, Joint mobilization, Stair training, Aquatic Therapy, Dry Needling, Spinal mobilization, Cryotherapy, Moist heat, Taping, and Manual therapy ?  ?PLAN FOR NEXT SESSION: cont stair training, eccentric quads ?  ? ? ?Maysoon Lozada C. Tarius Stangelo PT, DPT ?04/12/22 11:06 AM ? ? ? ?  ? ?

## 2022-04-14 ENCOUNTER — Ambulatory Visit (HOSPITAL_BASED_OUTPATIENT_CLINIC_OR_DEPARTMENT_OTHER): Payer: Medicare PPO | Admitting: Physical Therapy

## 2022-04-14 ENCOUNTER — Encounter (HOSPITAL_BASED_OUTPATIENT_CLINIC_OR_DEPARTMENT_OTHER): Payer: Self-pay | Admitting: Physical Therapy

## 2022-04-14 DIAGNOSIS — M6281 Muscle weakness (generalized): Secondary | ICD-10-CM

## 2022-04-14 DIAGNOSIS — M25551 Pain in right hip: Secondary | ICD-10-CM

## 2022-04-14 NOTE — Therapy (Signed)
?OUTPATIENT PHYSICAL THERAPY TREATMENT NOTE ? ? ?Patient Name: Samantha [name] ?MRN: [id number] ?DOB:Nov 25, 1946, 76 y.o., female ?Today's Date: 04/14/2022 ? ?PCP: Burnis Medin, MD ?REFERRING PROVIDER: Burnis Medin, MD ? ?END OF SESSION:  ? PT End of Session - 04/14/22 1309   ? ? Visit Number 5   ? Number of Visits 13   ? Date for PT Re-Evaluation 05/13/22   ? Authorization Type [employer] MCR   ? PT Start Time 1306   ? PT Stop Time 7824   ? PT Time Calculation (min) 37 min   ? Activity Tolerance Patient tolerated treatment well   ? Behavior During Therapy [facility] for tasks assessed/performed   ? ?  ?  ? ?  ? ? ? ? ? ?Past Medical History:  ?Diagnosis Date  ? Colonic polyp   ? ?Past Surgical History:  ?Procedure Laterality Date  ? CHOLECYSTECTOMY    ? TONSILLECTOMY    ? ?Patient Active Problem List  ? Diagnosis Date Noted  ? Knee swelling 09/08/2014  ? Knee strain 09/08/2014  ? Edema of right lower extremity 09/08/2014  ? Estrogen deficiency 03/17/2014  ? Visit for preventive health examination 03/17/2014  ? Abdominal pain, chronic, generalized 10/10/2012  ? Obesity 02/11/2012  ? Routine gynecological examination 02/11/2012  ? Back pain, thoracic 01/20/2012  ? COLONIC POLYPS, HX OF 02/07/2010  ? BENIGN NEOPLASM OF COLON 02/03/2010  ? VITAMIN D DEFICIENCY 02/03/2010  ? Hyperlipidemia 02/03/2010  ? SLEEP DISORDER 02/03/2010  ? ? ?REFERRING DIAG: M25.559 (ICD-10-CM) - Hip pain ? ?THERAPY DIAG:  ?Pain in right hip ? ?Muscle weakness (generalized) ? ?PERTINENT HISTORY: none ? ?PRECAUTIONS: none ? ?SUBJECTIVE:  ?Mostly just sore. Not feeling any pain in my hip per say. Heat was helpful to leg.  ? ? ?PAIN:  ?Are you having pain? Yes: NPRS scale: 0/10 ?Pain location: R hip ?Pain description: Achy ?Aggravating factors: stairs, walking, sitting ?Relieving factors: none stated  ? ? ?OBJECTIVE:  ?  ?DIAGNOSTIC FINDINGS: Xray FINDINGS: ?Pelvic ring is intact. Degenerative changes of right hip joint are ?noted with sclerosis and  subchondral cyst formation. No acute ?fracture or dislocation is noted. No soft tissue changes noted. ?  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 No acute abnormality is ?noted. ?  ?PATIENT SURVEYS:  ?LEFS 39 ?  ?COGNITION: ?          Overall cognitive status: Within functional limits for tasks assessed               ?           ?SENSATION: ?WFL ?  ?MUSCLE LENGTH: ?Bilat HS flexibility to approx 40 deg  ?  ?POSTURE:  ?Anterior pelvic tilt with slight trunk flexion ?  ?LE ROM: ?Rt Passive hip flexion to 90, painful & empty end feel ?  ?  ?LE MMT: lack of strength noted in CKC/stance phase of walking ?  ?MMT Right ?04/14/2022 Left ?04/14/2022  ?Hip flexion  18 lb  16 lb  ?Hip extension      ?Hip abduction  21  29 lb  ?Hip adduction      ?Hip internal rotation      ?Hip external rotation      ?Knee flexion      ?Knee extension      ? (Blank rows = not tested) ?  ?  ?GAIT: ?Anterior pelvic tilt, trunk sidebend to Rt in right stance phase, minimal hip extension bilaterally- denies pain in walking today ?  ?  ?  ?  ?  TODAY'S TREATMENT: ?4/20: ?MANUAL: roller to bilat quads in supine & self rolling in seated ?Nustep 5 min L5 ?Seated hip  flexor stretch ?Standing hip extension - standing on 2" step with UE assist on chair ?Sit<>stand ball bw knees ? ?4/18: ?THERACT- stair training- forward weight shift and gluts to come upright ? ?THEREX:  ?mod thomas stretch ?Bridge 10x 3s holds, arms across chest ?Sidelying abd + hip ext with knee flexed ?3lb LAQ with slow, eccentric motion ?Sit<>stand with hip hinge and glut set ?Seated HSS ? ?MANUAL: roller to Rt quads ? ? ?04/06/22: ?Ther Ex: ?Bike: L1 x 6 min  ? ?Standing:  ?Hip abd 2x10 bil;  March x 20 slow;  Tandem rocking with glut set at hip ext x 20 bil;  ? ?Supine:  ?Clam ER for ROM/stretch 10 sec x 10;  ?Bridges x20 green tband ? ?Manual:  ?Long axis distraction ; R hip inf and post mobs; PROM For flex and ER;  STM to R adductor  ? ? ? ?4/11: ?Long axis & lateral  distractions 3*30s ?Clam x15 ?Sidelying hip abd x15 ?Supine clam green tband ?Bridges green tband ?Standing glut set ?Tandem rocking with glut set at hip ext ?Gait training: glut set, trunk rotation, fast and slow speeds ? ?  ?  ?PATIENT EDUCATION:  ?Education details: Anatomy of condition, POC, HEP, exercise form/rationale ?  ?Person educated: Patient ?Education method: Explanation, Demonstration, Tactile cues, Verbal cues, and Handouts ?Education comprehension: verbalized understanding, returned demonstration, verbal cues required, tactile cues required, and needs further education ?  ?  ?HOME EXERCISE PROGRAM: ?   [id number] ?Left foot on step and right leg hanging ?  ?ASSESSMENT: ?  ?CLINICAL IMPRESSION: ?Still working to break habit of hopping up stairs. Will work on self rolling to decr quad tightness.  ? ?OBJECTIVE IMPAIRMENTS Abnormal gait, decreased activity tolerance, decreased ROM, decreased strength, increased muscle spasms, impaired flexibility, improper body mechanics, postural dysfunction, and pain.  ?  ?ACTIVITY LIMITATIONS cleaning, driving, and dressing .  ?  ?PERSONAL FACTORS Fitness and Time since onset of injury/illness/exacerbation are also affecting patient's functional outcome.  ?  ?  ?REHAB POTENTIAL: Good ?  ?CLINICAL DECISION MAKING: Stable/uncomplicated ?  ?EVALUATION COMPLEXITY: Low ?  ?  ?GOALS: ?Goals reviewed with patient? Yes ?  ?SHORT TERM GOALS: Target date: 04/22/2022 ?  ?Able to demo level pelvis in CKC ?Baseline: ?Goal status: INITIAL ?  ?  ?LONG TERM GOALS: Target date: 05/13/2022 ?  ?Pt will don shoes without hip pain ?Baseline:  ?Goal status: INITIAL ?  ?2.  Bil HS flexibility to at least 75 deg ?Baseline:  ?Goal status: INITIAL ?  ?3.  LEFS to improve by 9 point [place] ?Baseline:  ?Goal status: INITIAL ?  ?4.  Independent in gym program for long term strength and endurance ?Baseline:  ?Goal status: INITIAL ?  ?  ?PLAN: ?PT FREQUENCY: 2x/week ?  ?PT DURATION: 6 weeks ?  ?PLANNED  INTERVENTIONS: Therapeutic exercises, Therapeutic activity, Neuromuscular re-education, Balance training, Gait training, Patient/Family education, Joint mobilization, Stair training, Aquatic Therapy, Dry Needling, Spinal mobilization, Cryotherapy, Moist heat, Taping, and Manual therapy ?  ?PLAN FOR NEXT SESSION: cont stair training, step ups- encourage glut activation, CKC balance on Rt LE ?  ? ? ?Mileidy Atkin C. Lindberg Zenon PT, DPT ?04/14/22 1:44 PM ? ? ? ?  ? ?

## 2022-04-17 NOTE — Therapy (Signed)
?OUTPATIENT PHYSICAL THERAPY TREATMENT NOTE ? ? ?Patient Name: Samantha [name] ?MRN: [id number] ?DOB:12-18-1946, 76 y.o., female ?Today's Date: 04/19/2022 ? ?PCP: Burnis Medin, MD ?REFERRING PROVIDER: Burnis Medin, MD ? ?END OF SESSION:  ? PT End of Session - 04/18/22 1527   ? ? Visit Number 6   ? Number of Visits 13   ? Date for PT Re-Evaluation 05/13/22   ? Authorization Type [employer] MCR   ? PT Start Time 1525   ? PT Stop Time 6378   ? PT Time Calculation (min) 41 min   ? Activity Tolerance Patient tolerated treatment well   ? Behavior During Therapy [facility] for tasks assessed/performed   ? ?  ?  ? ?  ? ? ? ? ? ? ?Past Medical History:  ?Diagnosis Date  ? Colonic polyp   ? ?Past Surgical History:  ?Procedure Laterality Date  ? CHOLECYSTECTOMY    ? TONSILLECTOMY    ? ?Patient Active Problem List  ? Diagnosis Date Noted  ? Knee swelling 09/08/2014  ? Knee strain 09/08/2014  ? Edema of right lower extremity 09/08/2014  ? Estrogen deficiency 03/17/2014  ? Visit for preventive health examination 03/17/2014  ? Abdominal pain, chronic, generalized 10/10/2012  ? Obesity 02/11/2012  ? Routine gynecological examination 02/11/2012  ? Back pain, thoracic 01/20/2012  ? COLONIC POLYPS, HX OF 02/07/2010  ? BENIGN NEOPLASM OF COLON 02/03/2010  ? VITAMIN D DEFICIENCY 02/03/2010  ? Hyperlipidemia 02/03/2010  ? SLEEP DISORDER 02/03/2010  ? ? ?REFERRING DIAG: M25.559 (ICD-10-CM) - Hip pain ? ?THERAPY DIAG:  ?Pain in right hip ? ?Muscle weakness (generalized) ? ?PERTINENT HISTORY: none ? ?PRECAUTIONS: none ? ?SUBJECTIVE:  ?Pt denies any adverse effects after prior Rx.  Pt reports she has difficulty lifting her leg and tying her shoe.  Pt reports she is more conscious of how she moves, walks, and goes up steps.  Pt reports she feels her R knee more now since doing her exercises.  Pt states she tore her meniscus approx 4 years ago.  Pt reports it doesn't really hurt when she walks, but hurts when she sits.   Heat was helpful to leg.   ? ? ?PAIN:  ?Are you having pain? Yes: NPRS scale: 3/10 ?Pain location: R hip/groin ?Pain description: Achy ?Aggravating factors: stairs, walking, sitting ?Relieving factors: none stated  ? ? ?OBJECTIVE:  ?  ?DIAGNOSTIC FINDINGS: Xray FINDINGS: ?Pelvic ring is intact. Degenerative changes of right hip joint are ?noted with sclerosis and subchondral cyst formation. No acute ?fracture or dislocation is noted. No soft tissue changes noted. ?  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 No acute abnormality is ?noted. ?  ?  ?  ?  ?TODAY'S TREATMENT: ?04/18/2022 ? ?Therapeutic Exercise: ?-Reviewed response to prior Rx, pain level, and current function. ?-Pt performed: ?Nustep 5 min L5 ?Bridges with GTB 2x10 reps ?Supine clams with GTB 2x10 reps ?S/L hip abd 2x10 reps ?S/L clam approx 7 reps though stopped due to 4-5/10 pain ?Supine modified thomas stretch 2x30 sec ?Sit to stand with glute set 2x10 ? ? ? ?Manual Therapy:  ?Long axis distraction to R LE ; R hip inf and post mobs; roller to R quad in supine ? ? ? ?4/20: ?MANUAL: roller to bilat quads in supine & self rolling in seated ?Nustep 5 min L5 ?Seated hip  flexor stretch ?Standing hip extension - standing on 2" step with UE assist on chair ?Sit<>stand ball bw knees ? ?4/18: ?THERACT- stair  training- forward weight shift and gluts to come upright ? ?THEREX:  ?mod thomas stretch ?Bridge 10x 3s holds, arms across chest ?Sidelying abd + hip ext with knee flexed ?3lb LAQ with slow, eccentric motion ?Sit<>stand with hip hinge and glut set ?Seated HSS ? ?MANUAL: roller to Rt quads ? ? ?04/06/22: ?Ther Ex: ?Bike: L1 x 6 min  ? ?Standing:  ?Hip abd 2x10 bil;  March x 20 slow;  Tandem rocking with glut set at hip ext x 20 bil;  ? ?Supine:  ?Clam ER for ROM/stretch 10 sec x 10;  ?Bridges x20 green tband ? ?Manual:  ?Long axis distraction ; R hip inf and post mobs; PROM For flex and ER;  STM to R adductor  ? ? ?  ?  ?PATIENT EDUCATION:  ?Education details: Anatomy  of condition, POC, HEP, exercise form/rationale.  Instructed pt to not perform exercises that increase knee pain.   ? Person educated: Patient ?Education method: Explanation, Demonstration, Tactile cues, Verbal cues, and Handouts ?Education comprehension: verbalized understanding, returned demonstration, verbal cues required, tactile cues required, and needs further education ?  ?  ?HOME EXERCISE PROGRAM: ?   [id number] ?Left foot on step and right leg hanging ?  ?ASSESSMENT: ?  ?CLINICAL IMPRESSION: ?Pt reports improved sx's with long axis distraction.  Pt performed exercises well with cuing for correct form and positioning.  Attempted S/L clam though stopped due to pain.  Pt responded well to Rx stating she felt better and reporting improved pain from 3/10 before Rx to 2/10 after Rx.  Pt should benefit from cont skilled PT services to address goals and impairments and improve overall function.  ? ?OBJECTIVE IMPAIRMENTS Abnormal gait, decreased activity tolerance, decreased ROM, decreased strength, increased muscle spasms, impaired flexibility, improper body mechanics, postural dysfunction, and pain.  ?  ?ACTIVITY LIMITATIONS cleaning, driving, and dressing .  ?  ?PERSONAL FACTORS Fitness and Time since onset of injury/illness/exacerbation are also affecting patient's functional outcome.  ?  ?  ?REHAB POTENTIAL: Good ?  ?CLINICAL DECISION MAKING: Stable/uncomplicated ?  ?EVALUATION COMPLEXITY: Low ?  ?  ?GOALS: ?Goals reviewed with patient? Yes ?  ?SHORT TERM GOALS: Target date: 04/22/2022 ?  ?Able to demo level pelvis in CKC ?Baseline: ?Goal status: INITIAL ?  ?  ?LONG TERM GOALS: Target date: 05/13/2022 ?  ?Pt will don shoes without hip pain ?Baseline:  ?Goal status: INITIAL ?  ?2.  Bil HS flexibility to at least 75 deg ?Baseline:  ?Goal status: INITIAL ?  ?3.  LEFS to improve by 9 point [place] ?Baseline:  ?Goal status: INITIAL ?  ?4.  Independent in gym program for long term strength and endurance ?Baseline:  ?Goal  status: INITIAL ?  ?  ?PLAN: ?PT FREQUENCY: 2x/week ?  ?PT DURATION: 6 weeks ?  ?PLANNED INTERVENTIONS: Therapeutic exercises, Therapeutic activity, Neuromuscular re-education, Balance training, Gait training, Patient/Family education, Joint mobilization, Stair training, Aquatic Therapy, Dry Needling, Spinal mobilization, Cryotherapy, Moist heat, Taping, and Manual therapy ?  ?PLAN FOR NEXT SESSION: cont stair training, step ups- encourage glut activation, CKC balance on Rt LE.  Cont with MT.  ?  ? ?Selinda Michaels III PT, DPT ?04/19/22 10:21 PM ? ? ? ? ?  ? ?

## 2022-04-18 ENCOUNTER — Encounter (HOSPITAL_BASED_OUTPATIENT_CLINIC_OR_DEPARTMENT_OTHER): Payer: Self-pay | Admitting: Physical Therapy

## 2022-04-18 ENCOUNTER — Ambulatory Visit (HOSPITAL_BASED_OUTPATIENT_CLINIC_OR_DEPARTMENT_OTHER): Payer: Medicare PPO | Admitting: Physical Therapy

## 2022-04-18 DIAGNOSIS — M6281 Muscle weakness (generalized): Secondary | ICD-10-CM | POA: Diagnosis not present

## 2022-04-18 DIAGNOSIS — M25551 Pain in right hip: Secondary | ICD-10-CM | POA: Diagnosis not present

## 2022-04-19 ENCOUNTER — Ambulatory Visit: Payer: Medicare PPO | Admitting: Physician Assistant

## 2022-04-19 ENCOUNTER — Encounter: Payer: Self-pay | Admitting: Physician Assistant

## 2022-04-19 DIAGNOSIS — L570 Actinic keratosis: Secondary | ICD-10-CM

## 2022-04-19 NOTE — Patient Instructions (Signed)
If the face does not go away follow up.  ?

## 2022-04-19 NOTE — Progress Notes (Signed)
? ?  New Patient ?  ?Subjective  ?Samantha [name] is a 76 y.o. female who presents for the following: Skin Problem (New lesion on v of chest x 1 month- feels rough. No personal or family history of melanoma or non mole skin cancer. ). ? ? ?The following portions of the chart were reviewed this encounter and updated as appropriate:  Tobacco  Allergies  Meds  Problems  Med Hx  Surg Hx  Fam Hx   ?  ? ?Objective  ?Well appearing patient in no apparent distress; mood and affect are within normal limits. ? ?All skin waist up examined. ? ?Chest - Medial [facility]), Left Malar Cheek ?Erythematous patches with gritty scale. ? ? ?Assessment & Plan  ?AK (actinic keratosis) (2) ?Chest - Medial [facility]); Left Malar Cheek ? ?Destruction of lesion - Chest - Medial [facility]), Left Malar Cheek ?Complexity: simple   ?Destruction method: cryotherapy   ?Informed consent: discussed and consent obtained   ?Timeout:  patient name, date of birth, surgical site, and procedure verified ?Lesion destroyed using liquid nitrogen: Yes   ?Cryotherapy cycles:  1 ?Outcome: patient tolerated procedure well with no complications   ?Post-procedure details: wound care instructions given   ? ? ? ? ?I, Jaece Ducharme, PA-C, have reviewed all documentation's for this visit.  The documentation on 04/19/22 for the exam, diagnosis, procedures and orders are all accurate and complete. ?

## 2022-04-21 ENCOUNTER — Ambulatory Visit (HOSPITAL_BASED_OUTPATIENT_CLINIC_OR_DEPARTMENT_OTHER): Payer: Medicare PPO | Admitting: Physical Therapy

## 2022-04-21 ENCOUNTER — Encounter (HOSPITAL_BASED_OUTPATIENT_CLINIC_OR_DEPARTMENT_OTHER): Payer: Self-pay | Admitting: Physical Therapy

## 2022-04-21 DIAGNOSIS — M6281 Muscle weakness (generalized): Secondary | ICD-10-CM | POA: Diagnosis not present

## 2022-04-21 DIAGNOSIS — M25551 Pain in right hip: Secondary | ICD-10-CM

## 2022-04-21 NOTE — Therapy (Signed)
?OUTPATIENT PHYSICAL THERAPY TREATMENT NOTE ? ? ?Patient Name: Samantha [name] ?MRN: [id number] ?DOB:September 12, 1946, 76 y.o., female ?Today's Date: 04/21/2022 ? ?PCP: Burnis Medin, MD ?REFERRING PROVIDER: Burnis Medin, MD ? ?END OF SESSION:  ? PT End of Session - 04/21/22 1308   ? ? Visit Number 7   ? Number of Visits 13   ? Date for PT Re-Evaluation 05/13/22   ? Authorization Type [employer] MCR   ? PT Start Time 1306   ? PT Stop Time 1344   ? PT Time Calculation (min) 38 min   ? Activity Tolerance Patient tolerated treatment well   ? Behavior During Therapy [facility] for tasks assessed/performed   ? ?  ?  ? ?  ? ? ? ? ? ? ?Past Medical History:  ?Diagnosis Date  ? Colonic polyp   ? ?Past Surgical History:  ?Procedure Laterality Date  ? CHOLECYSTECTOMY    ? TONSILLECTOMY    ? ?Patient Active Problem List  ? Diagnosis Date Noted  ? Knee swelling 09/08/2014  ? Knee strain 09/08/2014  ? Edema of right lower extremity 09/08/2014  ? Estrogen deficiency 03/17/2014  ? Visit for preventive health examination 03/17/2014  ? Abdominal pain, chronic, generalized 10/10/2012  ? Obesity 02/11/2012  ? Routine gynecological examination 02/11/2012  ? Back pain, thoracic 01/20/2012  ? COLONIC POLYPS, HX OF 02/07/2010  ? BENIGN NEOPLASM OF COLON 02/03/2010  ? VITAMIN D DEFICIENCY 02/03/2010  ? Hyperlipidemia 02/03/2010  ? SLEEP DISORDER 02/03/2010  ? ? ?REFERRING DIAG: M25.559 (ICD-10-CM) - Hip pain ? ?THERAPY DIAG:  ?Pain in right hip ? ?Muscle weakness (generalized) ? ?PERTINENT HISTORY: none ? ?PRECAUTIONS: none ? ?SUBJECTIVE:  ?Left knee hurts. I am walking and doing heat on hip.  ? ? ?PAIN:  ?Are you having pain? Yes: NPRS scale: 6/10 ?Pain location: R hip/groin ?Pain description: Achy ?Aggravating factors: stairs, walking, sitting ?Relieving factors: none stated  ? ? ?OBJECTIVE:  ?  ?DIAGNOSTIC FINDINGS: Xray FINDINGS: ?Pelvic ring is intact. Degenerative changes of right hip joint are ?noted with sclerosis and subchondral cyst formation.  No acute ?fracture or dislocation is noted. No soft tissue changes noted. ?  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 No acute abnormality is ?noted. ?  ?  ?  ?  ?TODAY'S TREATMENT: ?4/27: ?MANUAL: roller & trigger point release to Rt hip, VL; stretching to supine HSS ?Supine mod thomas stretch for Rt quads ?SLR x10 neutral, x10 ER done on both sides, cues for core contraction ?Sidelying hip abduction ?SLS ? ? ?04/18/2022 ? ?Therapeutic Exercise: ?-Reviewed response to prior Rx, pain level, and current function. ?-Pt performed: ?Nustep 5 min L5 ?Bridges with GTB 2x10 reps ?Supine clams with GTB 2x10 reps ?S/L hip abd 2x10 reps ?S/L clam approx 7 reps though stopped due to 4-5/10 pain ?Supine modified thomas stretch 2x30 sec ?Sit to stand with glute set 2x10 ? ?Manual Therapy:  ?Long axis distraction to R LE ; R hip inf and post mobs; roller to R quad in supine ? ? ? ?4/20: ?MANUAL: roller to bilat quads in supine & self rolling in seated ?Nustep 5 min L5 ?Seated hip  flexor stretch ?Standing hip extension - standing on 2" step with UE assist on chair ?Sit<>stand ball bw knees ? ? ?  ?  ?PATIENT EDUCATION:  ?Education details: Anatomy of condition, POC, HEP, exercise form/rationale.    ? Person educated: Patient ?Education method: Explanation, Demonstration, Tactile cues, Verbal cues, and Handouts ?Education comprehension: verbalized  understanding, returned demonstration, verbal cues required, tactile cues required, and needs further education ?  ?  ?HOME EXERCISE PROGRAM: ?   [id number] ?Left foot on step and right leg hanging ?  ?ASSESSMENT: ?  ?CLINICAL IMPRESSION: ?Cont to have significant TTP along VL and in hip abd group. Trigger points reduced with manual therapy but pt has low tolerance to STM. Difficulty maintaining neutral pelvis in SLR bilaterally. Will cont to progress with attention paid to left knee to avoid increased knee pain.  ? ?OBJECTIVE IMPAIRMENTS Abnormal gait, decreased activity  tolerance, decreased ROM, decreased strength, increased muscle spasms, impaired flexibility, improper body mechanics, postural dysfunction, and pain.  ?  ?ACTIVITY LIMITATIONS cleaning, driving, and dressing .  ?  ?PERSONAL FACTORS Fitness and Time since onset of injury/illness/exacerbation are also affecting patient's functional outcome.  ?  ?  ?REHAB POTENTIAL: Good ?  ?CLINICAL DECISION MAKING: Stable/uncomplicated ?  ?EVALUATION COMPLEXITY: Low ?  ?  ?GOALS: ?Goals reviewed with patient? Yes ?  ?SHORT TERM GOALS: Target date: 04/22/2022 ?  ?Able to demo level pelvis in CKC ?Baseline: ?Goal status: achieved ?  ?  ?LONG TERM GOALS: Target date: 05/13/2022 ?  ?Pt will don shoes without hip pain ?Baseline:  ?Goal status: INITIAL ?  ?2.  Bil HS flexibility to at least 75 deg ?Baseline:  ?Goal status: INITIAL ?  ?3.  LEFS to improve by 9 point [place] ?Baseline:  ?Goal status: INITIAL ?  ?4.  Independent in gym program for long term strength and endurance ?Baseline:  ?Goal status: INITIAL ?  ?  ?PLAN: ?PT FREQUENCY: 2x/week ?  ?PT DURATION: 6 weeks ?  ?PLANNED INTERVENTIONS: Therapeutic exercises, Therapeutic activity, Neuromuscular re-education, Balance training, Gait training, Patient/Family education, Joint mobilization, Stair training, Aquatic Therapy, Dry Needling, Spinal mobilization, Cryotherapy, Moist heat, Taping, and Manual therapy ?  ?PLAN FOR NEXT SESSION: cont stair training, step ups- encourage glut activation, CKC balance on Rt LE.  Cont with MT.  ?  ? ?Chrisann Melaragno C. Denell Cothern PT, DPT ?04/21/22 1:46 PM ? ? ? ? ?  ? ?

## 2022-04-26 ENCOUNTER — Ambulatory Visit (HOSPITAL_BASED_OUTPATIENT_CLINIC_OR_DEPARTMENT_OTHER): Payer: Medicare PPO | Attending: Internal Medicine | Admitting: Physical Therapy

## 2022-04-26 DIAGNOSIS — E669 Obesity, unspecified: Secondary | ICD-10-CM | POA: Diagnosis not present

## 2022-04-26 DIAGNOSIS — G8929 Other chronic pain: Secondary | ICD-10-CM | POA: Diagnosis not present

## 2022-04-26 DIAGNOSIS — M6281 Muscle weakness (generalized): Secondary | ICD-10-CM | POA: Insufficient documentation

## 2022-04-26 DIAGNOSIS — M25551 Pain in right hip: Secondary | ICD-10-CM | POA: Insufficient documentation

## 2022-04-26 DIAGNOSIS — E785 Hyperlipidemia, unspecified: Secondary | ICD-10-CM | POA: Diagnosis not present

## 2022-04-26 DIAGNOSIS — Z6833 Body mass index (BMI) 33.0-33.9, adult: Secondary | ICD-10-CM | POA: Diagnosis not present

## 2022-04-26 DIAGNOSIS — Z8249 Family history of ischemic heart disease and other diseases of the circulatory system: Secondary | ICD-10-CM | POA: Diagnosis not present

## 2022-04-26 DIAGNOSIS — Z809 Family history of malignant neoplasm, unspecified: Secondary | ICD-10-CM | POA: Diagnosis not present

## 2022-04-26 NOTE — Therapy (Signed)
?OUTPATIENT PHYSICAL THERAPY TREATMENT NOTE ? ? ?Patient Name: Samantha [name] ?MRN: [id number] ?DOB:1946-01-23, 76 y.o., female ?Today's Date: 04/27/2022 ? ?PCP: Burnis Medin, MD ?REFERRING PROVIDER: Burnis Medin, MD ? ?END OF SESSION:  ? PT End of Session - 04/26/22 1353   ? ? Visit Number 8   ? Number of Visits 13   ? Date for PT Re-Evaluation 05/13/22   ? Authorization Type [employer] MCR   ? PT Start Time 1308   ? PT Stop Time 1342   ? PT Time Calculation (min) 34 min   ? Activity Tolerance Patient tolerated treatment well   ? Behavior During Therapy [facility] for tasks assessed/performed   ? ?  ?  ? ?  ? ? ? ? ? ? ? ?Past Medical History:  ?Diagnosis Date  ? Colonic polyp   ? ?Past Surgical History:  ?Procedure Laterality Date  ? CHOLECYSTECTOMY    ? TONSILLECTOMY    ? ?Patient Active Problem List  ? Diagnosis Date Noted  ? Knee swelling 09/08/2014  ? Knee strain 09/08/2014  ? Edema of right lower extremity 09/08/2014  ? Estrogen deficiency 03/17/2014  ? Visit for preventive health examination 03/17/2014  ? Abdominal pain, chronic, generalized 10/10/2012  ? Obesity 02/11/2012  ? Routine gynecological examination 02/11/2012  ? Back pain, thoracic 01/20/2012  ? COLONIC POLYPS, HX OF 02/07/2010  ? BENIGN NEOPLASM OF COLON 02/03/2010  ? VITAMIN D DEFICIENCY 02/03/2010  ? Hyperlipidemia 02/03/2010  ? SLEEP DISORDER 02/03/2010  ? ? ?REFERRING DIAG: M25.559 (ICD-10-CM) - Hip pain ? ?THERAPY DIAG:  ?Pain in right hip ? ?Muscle weakness (generalized) ? ?PERTINENT HISTORY: none ? ?PRECAUTIONS: none ? ?SUBJECTIVE:  ?Pt states she was sore after prior Rx though had no adverse effects.  Pt walked 3 miles yesterday which is more than what she has been doing.  She states both her legs are hurting today.  Pt c/o's of bilat thigh pain.  Pt reports compliance with HEP.  Pt uses heat which helps though didn't use it last night.  Pt states her L knee and thigh have begun hurting since PT.  Pt reports her groin pain has not worsened  though the pain is in different places now since starting PT.   Pt states her leg is weak.  She is having difficulty lifting R LE including with car transfers.    ? ? ?PAIN:  ?Are you having pain? Yes ?7/10 R thigh and R lateral hip ?6/10 L thigh ? ? ?OBJECTIVE:  ?  ?DIAGNOSTIC FINDINGS: Xray FINDINGS: ?Pelvic ring is intact. Degenerative changes of right hip joint are ?noted with sclerosis and subchondral cyst formation. No acute ?fracture or dislocation is noted. No soft tissue changes noted. ?  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 No acute abnormality is ?noted. ?  ?  ?  ?  ?TODAY'S TREATMENT: ? ? ?04/18/2022 ? ?Therapeutic Exercise: to improve flexibility, tightness, and pain ?-Reviewed response to prior Rx, pt presentation pain level, and current function. ?-Pt performed: ?Supine modified thomas stretch 2x30 sec ?Prone manual quad stretch 3x20-30 sec ? -PT answered Pt's questions. ? ?Manual Therapy:  ?Long axis distraction to R LE ; STM and roller to R quad in supine and R glute in L S/L'ing with pillow b/w knees to improve pain, tightness, and mobility ? ? ?  ?  ?PATIENT EDUCATION:  ?Education details: Anatomy of condition, POC, HEP, exercise form/rationale.  PT answered Pt's questions.   ? Person educated: Patient ?  Education method: Explanation, Demonstration, Tactile cues, Verbal cues, and Handouts ?Education comprehension: verbalized understanding, returned demonstration, verbal cues required, tactile cues required, and needs further education ?  ?  ?HOME EXERCISE PROGRAM: ?   [id number] ?Left foot on step and right leg hanging ?  ?ASSESSMENT: ?  ?CLINICAL IMPRESSION: ?Pt performed a longer walk yesterday than she normally does and presents to Rx c/o'ing of pain in bilat LE's.  Pt states she has had increased L thigh and knee pain since starting PT.  PT focused on manual therapy today and didn't perform strengthening exercises due to pt's c/o's of pain.  Pt has soft tissue tightness in  quad and has trigger points in VL.  Pt was very tender with STM and was especially tender in R VL.  Pt had pain with manual therapy though reports improved pain from  7/10 before Rx to 5/10 after Rx.  Pt states "It feels better.  It really does" after Rx.  ? ?OBJECTIVE IMPAIRMENTS Abnormal gait, decreased activity tolerance, decreased ROM, decreased strength, increased muscle spasms, impaired flexibility, improper body mechanics, postural dysfunction, and pain.  ?  ?ACTIVITY LIMITATIONS cleaning, driving, and dressing .  ?  ?PERSONAL FACTORS Fitness and Time since onset of injury/illness/exacerbation are also affecting patient's functional outcome.  ?  ?  ?REHAB POTENTIAL: Good ?  ?CLINICAL DECISION MAKING: Stable/uncomplicated ?  ?EVALUATION COMPLEXITY: Low ?  ?  ?GOALS: ?Goals reviewed with patient? Yes ?  ?SHORT TERM GOALS: Target date: 04/22/2022 ?  ?Able to demo level pelvis in CKC ?Baseline: ?Goal status: achieved ?  ?  ?LONG TERM GOALS: Target date: 05/13/2022 ?  ?Pt will don shoes without hip pain ?Baseline:  ?Goal status: INITIAL ?  ?2.  Bil HS flexibility to at least 75 deg ?Baseline:  ?Goal status: INITIAL ?  ?3.  LEFS to improve by 9 point [place] ?Baseline:  ?Goal status: INITIAL ?  ?4.  Independent in gym program for long term strength and endurance ?Baseline:  ?Goal status: INITIAL ?  ?  ?PLAN: ?PT FREQUENCY: 2x/week ?  ?PT DURATION: 6 weeks ?  ?PLANNED INTERVENTIONS: Therapeutic exercises, Therapeutic activity, Neuromuscular re-education, Balance training, Gait training, Patient/Family education, Joint mobilization, Stair training, Aquatic Therapy, Dry Needling, Spinal mobilization, Cryotherapy, Moist heat, Taping, and Manual therapy ?  ?PLAN FOR NEXT SESSION: cont stair training, step ups- encourage glut activation, CKC balance on Rt LE.  Cont with manual therapy including STW to quad especially VL.  Monitor L thigh and knee pain.  ?  ? ?Selinda Michaels III PT, DPT ?04/27/22 11:36 AM ? ? ? ? ? ?  ? ?

## 2022-04-27 ENCOUNTER — Encounter (HOSPITAL_BASED_OUTPATIENT_CLINIC_OR_DEPARTMENT_OTHER): Payer: Self-pay | Admitting: Physical Therapy

## 2022-04-28 ENCOUNTER — Ambulatory Visit (HOSPITAL_BASED_OUTPATIENT_CLINIC_OR_DEPARTMENT_OTHER): Payer: Medicare PPO | Admitting: Physical Therapy

## 2022-04-28 DIAGNOSIS — M6281 Muscle weakness (generalized): Secondary | ICD-10-CM

## 2022-04-28 DIAGNOSIS — M25551 Pain in right hip: Secondary | ICD-10-CM | POA: Diagnosis not present

## 2022-04-28 NOTE — Therapy (Signed)
?OUTPATIENT PHYSICAL THERAPY TREATMENT NOTE ? ? ?Patient Name: Samantha [name] ?MRN: [id number] ?DOB:May 07, 1946, 76 y.o., female ?Today's Date: 04/29/2022 ? ?PCP: Burnis Medin, MD ?REFERRING PROVIDER: Burnis Medin, MD ? ?END OF SESSION:  ? PT End of Session - 04/29/22 1520   ? ? Visit Number 9   ? Number of Visits 13   ? Date for PT Re-Evaluation 05/13/22   ? Authorization Type [employer] MCR   ? PT Start Time 9509   ? PT Stop Time 1520   ? PT Time Calculation (min) 38 min   ? Activity Tolerance Patient tolerated treatment well   ? Behavior During Therapy [facility] for tasks assessed/performed   ? ?  ?  ? ?  ? ? ? ? ? ? ? ? ?Past Medical History:  ?Diagnosis Date  ? Colonic polyp   ? ?Past Surgical History:  ?Procedure Laterality Date  ? CHOLECYSTECTOMY    ? TONSILLECTOMY    ? ?Patient Active Problem List  ? Diagnosis Date Noted  ? Knee swelling 09/08/2014  ? Knee strain 09/08/2014  ? Edema of right lower extremity 09/08/2014  ? Estrogen deficiency 03/17/2014  ? Visit for preventive health examination 03/17/2014  ? Abdominal pain, chronic, generalized 10/10/2012  ? Obesity 02/11/2012  ? Routine gynecological examination 02/11/2012  ? Back pain, thoracic 01/20/2012  ? COLONIC POLYPS, HX OF 02/07/2010  ? BENIGN NEOPLASM OF COLON 02/03/2010  ? VITAMIN D DEFICIENCY 02/03/2010  ? Hyperlipidemia 02/03/2010  ? SLEEP DISORDER 02/03/2010  ? ? ?REFERRING DIAG: M25.559 (ICD-10-CM) - Hip pain ? ?THERAPY DIAG:  ?Pain in right hip ? ?Muscle weakness (generalized) ? ?PERTINENT HISTORY: none ? ?PRECAUTIONS: none ? ?SUBJECTIVE:  ?Pt states she felt better after prior Rx and feels better today.  Pt thinks the 3 mile walk "did her in" before prior Rx.  Pt reports compliance with HEP.  Pt uses heat which helps though didn't use it last night.  Pt states her leg is weak.  She is having difficulty lifting R LE including with car transfers.    ? ? ?PAIN:  ?Are you having pain? Yes ?3/10 R thigh and R hip ?0/10 L knee and thigh ? ? ?OBJECTIVE:  ?   ?DIAGNOSTIC FINDINGS: Xray FINDINGS: ?Pelvic ring is intact. Degenerative changes of right hip joint are ?noted with sclerosis and subchondral cyst formation. No acute ?fracture or dislocation is noted. No soft tissue changes noted. ?  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 No acute abnormality is ?noted. ?  ?  ?  ?  ?TODAY'S TREATMENT: ? ?Therapeutic Exercise: to improve flexibility, tightness, and pain ? ?-Pt performed: ?Supine modified thomas stretch 3x30 sec ?Prone manual quad stretch 3x30 sec ?Prone hip ER manual stretch 2x30 sec ?Supine manual quad stretch in modified thomas test position 2x30 sec ? -PT answered Pt's questions. ? ?Manual Therapy:  ?-Reviewed response to prior Rx, pt presentation, pain level, and current function. ?-Long axis distraction to R LE ; STM and roller to R quad in supine and R glute in L S/L'ing with pillow b/w knees to improve pain, tightness, and mobility ? ? ?  ?  ?PATIENT EDUCATION:  ?Education details: Anatomy of condition, POC, HEP, exercise form/rationale.  PT answered Pt's questions.   ? Person educated: Patient ?Education method: Explanation, Demonstration, Tactile cues, Verbal cues, and Handouts ?Education comprehension: verbalized understanding, returned demonstration, verbal cues required, tactile cues required, and needs further education ?  ?  ?HOME EXERCISE PROGRAM: ?  [id number] ?Left foot on step and right leg hanging ?  ?ASSESSMENT: ?  ?CLINICAL IMPRESSION: ?Pt responded well to prior Rx stating she felt better after Rx and is feeling better today.  PT focused on manual therapy today and flexibility exercises.  Pt has soft tissue tightness in quad and has trigger points in VL.  Pt was very tender with STM and was especially tender in R VL.  Pt had pain with STM though responded well to Rx.  She states she feels better and has less pain after Rx.  Pt should benefit from cont skilled PT services to address ongoing goals and to restore PLOF.    ? ?OBJECTIVE IMPAIRMENTS Abnormal gait, decreased activity tolerance, decreased ROM, decreased strength, increased muscle spasms, impaired flexibility, improper body mechanics, postural dysfunction, and pain.  ?  ?ACTIVITY LIMITATIONS cleaning, driving, and dressing .  ?  ?PERSONAL FACTORS Fitness and Time since onset of injury/illness/exacerbation are also affecting patient's functional outcome.  ?  ?  ?REHAB POTENTIAL: Good ?  ?CLINICAL DECISION MAKING: Stable/uncomplicated ?  ?EVALUATION COMPLEXITY: Low ?  ?  ?GOALS: ?Goals reviewed with patient? Yes ?  ?SHORT TERM GOALS: Target date: 04/22/2022 ?  ?Able to demo level pelvis in CKC ?Baseline: ?Goal status: achieved ?  ?  ?LONG TERM GOALS: Target date: 05/13/2022 ?  ?Pt will don shoes without hip pain ?Baseline:  ?Goal status: INITIAL ?  ?2.  Bil HS flexibility to at least 75 deg ?Baseline:  ?Goal status: INITIAL ?  ?3.  LEFS to improve by 9 point [place] ?Baseline:  ?Goal status: INITIAL ?  ?4.  Independent in gym program for long term strength and endurance ?Baseline:  ?Goal status: INITIAL ?  ?  ?PLAN: ?PT FREQUENCY: 2x/week ?  ?PT DURATION: 6 weeks ?  ?PLANNED INTERVENTIONS: Therapeutic exercises, Therapeutic activity, Neuromuscular re-education, Balance training, Gait training, Patient/Family education, Joint mobilization, Stair training, Aquatic Therapy, Dry Needling, Spinal mobilization, Cryotherapy, Moist heat, Taping, and Manual therapy ?  ?PLAN FOR NEXT SESSION: cont stair training, step ups- encourage glut activation, CKC balance on Rt LE.  Cont with manual therapy including STW to quad especially VL.  Monitor L thigh and knee pain.  ?  ? ?Selinda Michaels III PT, DPT ?04/29/22 4:03 PM ? ? ? ? ? ? ?  ? ?

## 2022-04-29 ENCOUNTER — Encounter (HOSPITAL_BASED_OUTPATIENT_CLINIC_OR_DEPARTMENT_OTHER): Payer: Self-pay | Admitting: Physical Therapy

## 2022-05-03 ENCOUNTER — Ambulatory Visit (HOSPITAL_BASED_OUTPATIENT_CLINIC_OR_DEPARTMENT_OTHER): Payer: Medicare PPO | Admitting: Physical Therapy

## 2022-05-03 DIAGNOSIS — M6281 Muscle weakness (generalized): Secondary | ICD-10-CM

## 2022-05-03 DIAGNOSIS — M25551 Pain in right hip: Secondary | ICD-10-CM

## 2022-05-03 NOTE — Therapy (Addendum)
OUTPATIENT PHYSICAL THERAPY TREATMENT NOTE   Patient Name: Samantha [name] MRN: [id number] DOB:1946-11-08, 76 y.o., female Today's Date: 05/04/2022  PCP: Burnis Medin, MD REFERRING PROVIDER: Burnis Medin, MD  END OF SESSION:   PT End of Session - 05/03/22 1359     Visit Number 10    Number of Visits 11    Date for PT Re-Evaluation 05/13/22    Authorization Type [employer] MCR    PT Start Time 1310    PT Stop Time 1345    PT Time Calculation (min) 35 min    Activity Tolerance Patient tolerated treatment well    Behavior During Therapy [facility] for tasks assessed/performed                Past Medical History:  Diagnosis Date   Colonic polyp    Past Surgical History:  Procedure Laterality Date   CHOLECYSTECTOMY     TONSILLECTOMY     Patient Active Problem List   Diagnosis Date Noted   Knee swelling 09/08/2014   Knee strain 09/08/2014   Edema of right lower extremity 09/08/2014   Estrogen deficiency 03/17/2014   Visit for preventive health examination 03/17/2014   Abdominal pain, chronic, generalized 10/10/2012   Obesity 02/11/2012   Routine gynecological examination 02/11/2012   Back pain, thoracic 01/20/2012   COLONIC POLYPS, HX OF 02/07/2010   BENIGN NEOPLASM OF COLON 02/03/2010   VITAMIN D DEFICIENCY 02/03/2010   Hyperlipidemia 02/03/2010   SLEEP DISORDER 02/03/2010    REFERRING DIAG: M25.559 (ICD-10-CM) - Hip pain  THERAPY DIAG:  Pain in right hip  Muscle weakness (generalized)  PERTINENT HISTORY: none  PRECAUTIONS: none  SUBJECTIVE:  Pt reports having increased pain and soreness after prior Rx which lasted 2-3 days.  Pt states she used ice and heat which helped. Pt states she was having more localized pain before therapy, but now is having more widespread pain.  Pt states she was walking more prior to therapy though is walking less now since starting PT due to pain.  She is having difficulty and pain with lifting R LE to perform transfers including  car transfers and also performing stairs.  Pt reports having pain with donning shoes.   Pt reports no improvement in pain and function.     PAIN:  Are you having pain? Yes 0-1/10 current and best R thigh, worst pain 7-8/10 1/10 L knee    OBJECTIVE:    DIAGNOSTIC FINDINGS: Xray FINDINGS: Pelvic ring is intact. Degenerative changes of right hip joint are noted with sclerosis and subchondral cyst formation. No acute fracture or dislocation is noted. No soft tissue changes noted.   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 No acute abnormality is noted.         TODAY'S TREATMENT:  PHYSICAL PERFORMANCE TESTING:  PATIENT SURVEYS:  LEFS 38/80 which slightly worsened from piror 39/80     MUSCLE LENGTH: HS R/L flexibility 65/64 deg      LE ROM: Rt Passive hip flexion to 95     LE MMT: lack of strength noted in CKC/stance phase of walking   MMT Right  Left  Hip flexion  4-/5 Prior 18: Current 21.3 4+/5 Prior 16 : Current 22.3   Hip extension      Hip abduction  4/5 Prior 21: Current: 22.3 5/5   Hip adduction      Hip internal rotation      Hip external rotation      Knee flexion  Knee extension      Ankle dorsiflexion      Ankle plantarflexion      Ankle inversion      Ankle eversion       (Blank rows = not tested)     GAIT: Improved gait though decreased pelvic rotation and minimal hip extension bilaterally      PATIENT EDUCATION:  Education details: Anatomy of condition, POC, objective findings, HEP, and exercise rationale.  PT answered Pt's questions.  PT instructed pt to return to MD.   Person educated: Patient Education method: Explanation, Demonstration, Tactile cues, Verbal cues Education comprehension: verbalized understanding, returned demonstration, verbal cues required, tactile cues required, and needs further education     HOME EXERCISE PROGRAM:    [id number] Left foot on step and right leg hanging   ASSESSMENT:    CLINICAL IMPRESSION: Pt has been in PT for 9 visits and reports no functional improvements or improvements in pain and sx's.  She actually states she has increased pain since starting PT though also has increased soreness.  Pt reports her walking has decreased due to pain.  Pt responded well recently with decreased exercises and increased MT and stretching though had increased pain with that Rx after prior Rx.  Pt demonstrated improved hip flexion strength bilat and slight improvement with R hip abduction strength.  Pt demonstrates improved gait though continues to decreased hip extension and decreased pelvic rotation bilat.  Pt also demonstrates improved HS flexibility.  Her LEFS slightly worsened by 1 point.  Pt has met STG #1 and is progressing toward LTG#2 though has not met other LTG's.  Pt may benefit from 1 more PT appointment to determine response to exercises and finalize HEP.   OBJECTIVE IMPAIRMENTS Abnormal gait, decreased activity tolerance, decreased ROM, decreased strength, increased muscle spasms, impaired flexibility, improper body mechanics, postural dysfunction, and pain.    ACTIVITY LIMITATIONS cleaning, driving, and dressing .    PERSONAL FACTORS Fitness and Time since onset of injury/illness/exacerbation are also affecting patient's functional outcome.      REHAB POTENTIAL: Good   CLINICAL DECISION MAKING: Stable/uncomplicated   EVALUATION COMPLEXITY: Low     GOALS: Goals reviewed with patient? Yes   SHORT TERM GOALS: Target date: 04/22/2022   Able to demo level pelvis in CKC Baseline: Goal status: achieved     LONG TERM GOALS: Target date: 05/13/2022   Pt will don shoes without hip pain Baseline:  Goal status: NOT MET   2.  Bil HS flexibility to at least 75 deg Baseline:  Goal status: Progressing   3.  LEFS to improve by 9 point [place] Baseline:  Goal status: NOT MET   4.  Independent in gym program for long term strength and endurance Baseline:  Goal  status: INITIAL     PLAN:   PLANNED INTERVENTIONS: Therapeutic exercises, Therapeutic activity, Neuromuscular re-education, Balance training, Gait training, Patient/Family education, Joint mobilization, Stair training, Aquatic Therapy, Dry Needling, Spinal mobilization, Cryotherapy, Moist heat, Taping, and Manual therapy   PLAN FOR NEXT SESSION:  1 more visit to review and finalize HEP.   PT instructed pt to contact MD concerning lack of progress and continued pain and pt agreed.     Selinda Michaels III PT, DPT 05/04/22 11:03 AM  PHYSICAL THERAPY DISCHARGE SUMMARY  Visits from Start of Care: 9  Current functional level related to goals / functional outcomes: Patient was seen in PT from April 01, 2022 to May 03, 2022.  Patient canceled her following  appointments due to schedule conflict and not feeling well.  Pt reported no functional improvements or improvements in pain and sx's.  She actually states she has increased pain since starting PT.   Remaining deficits: See above   Education / Equipment: Pt has a HEP.   The plan was for patient to return for 1 more visit to finalize HEP.  Patient was also instructed to contact MD concerning lack of progress and continued pain.  She will be discharged from skilled PT due to lack of progress.    Selinda Michaels III PT, DPT 11/17/22 7:23 AM

## 2022-05-04 ENCOUNTER — Encounter (HOSPITAL_BASED_OUTPATIENT_CLINIC_OR_DEPARTMENT_OTHER): Payer: Self-pay | Admitting: Physical Therapy

## 2022-05-05 ENCOUNTER — Ambulatory Visit (HOSPITAL_BASED_OUTPATIENT_CLINIC_OR_DEPARTMENT_OTHER): Payer: Medicare PPO | Admitting: Physical Therapy

## 2022-05-05 ENCOUNTER — Encounter (HOSPITAL_BASED_OUTPATIENT_CLINIC_OR_DEPARTMENT_OTHER): Payer: Self-pay

## 2022-05-10 ENCOUNTER — Ambulatory Visit (HOSPITAL_BASED_OUTPATIENT_CLINIC_OR_DEPARTMENT_OTHER): Payer: Medicare PPO | Admitting: Physical Therapy

## 2022-05-12 ENCOUNTER — Ambulatory Visit (HOSPITAL_BASED_OUTPATIENT_CLINIC_OR_DEPARTMENT_OTHER): Payer: Medicare PPO | Admitting: Physical Therapy

## 2022-05-12 DIAGNOSIS — M25551 Pain in right hip: Secondary | ICD-10-CM | POA: Diagnosis not present

## 2022-06-01 ENCOUNTER — Encounter: Payer: Self-pay | Admitting: Internal Medicine

## 2022-06-13 IMAGING — DX DG HIP (WITH OR WITHOUT PELVIS) 2-3V*R*
3 series · 3 of 3 positions shown · non-contrast
Comparison: None.

CLINICAL DATA: Chronic right hip pain, initial encounter

EXAM:
DG HIP (WITH OR WITHOUT PELVIS) 3V RIGHT

[pelvis ap]
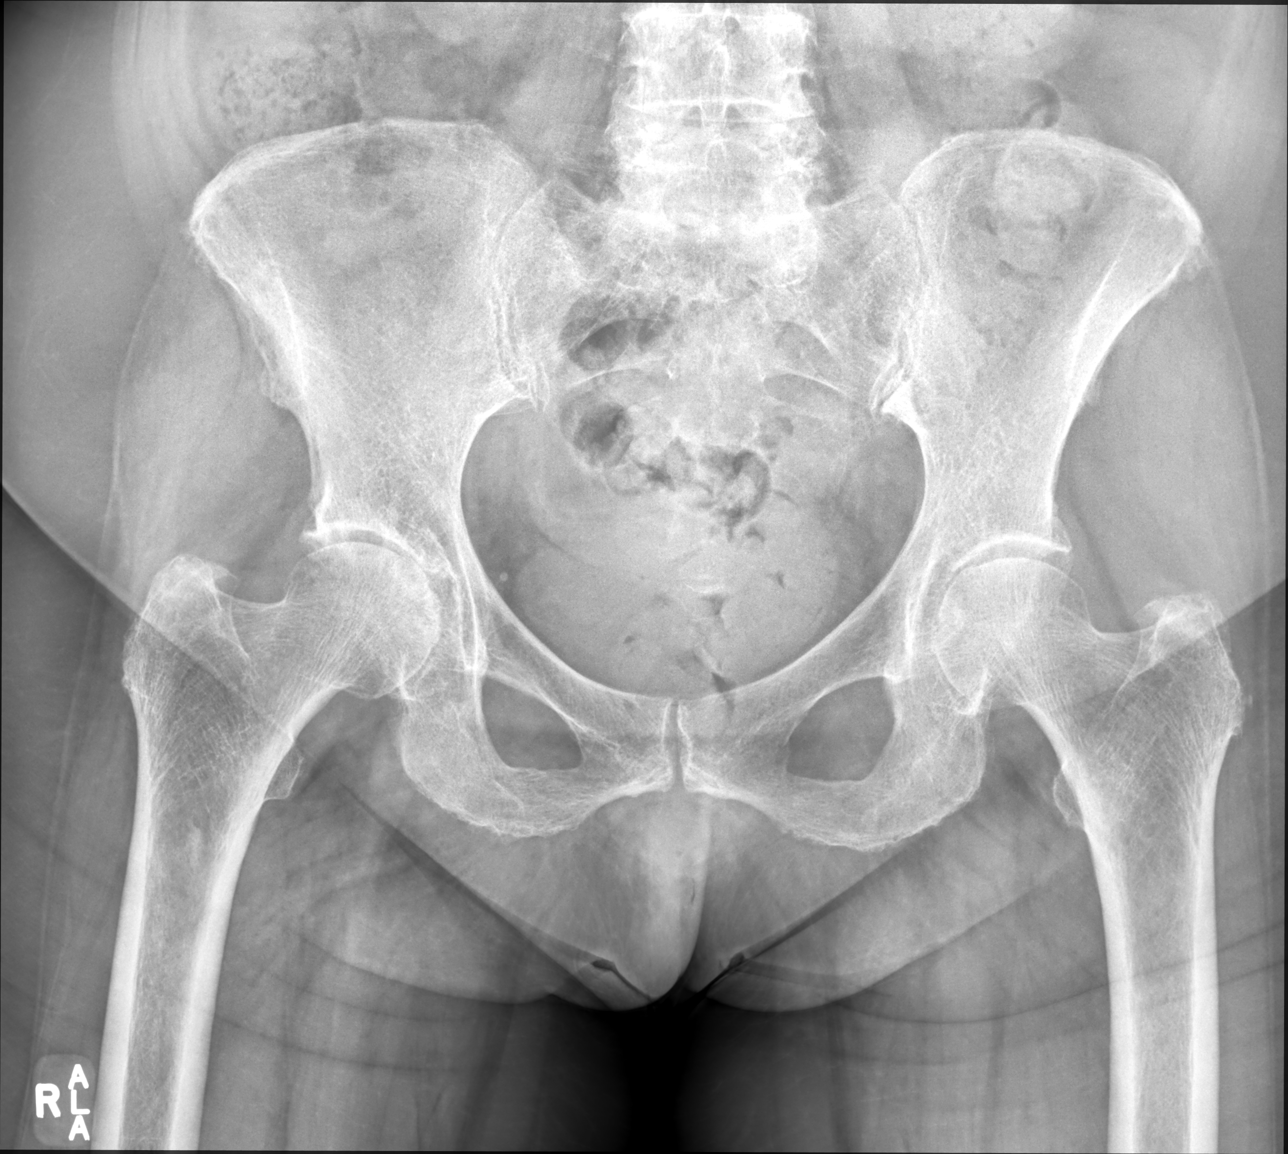

[hip joint ap]
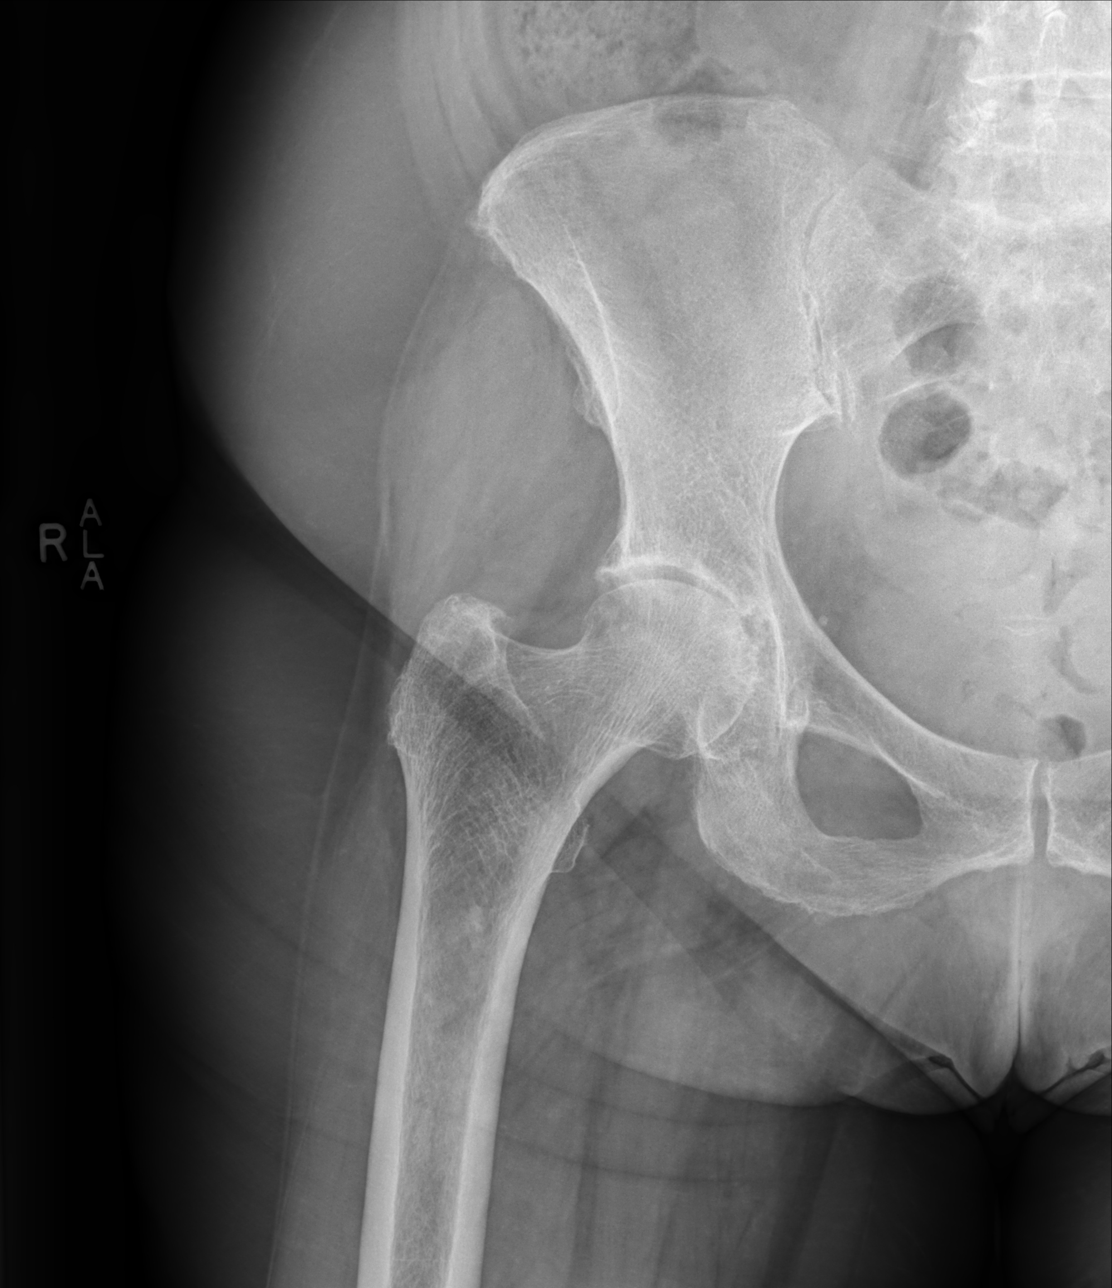

[hip (frog leg) lat]
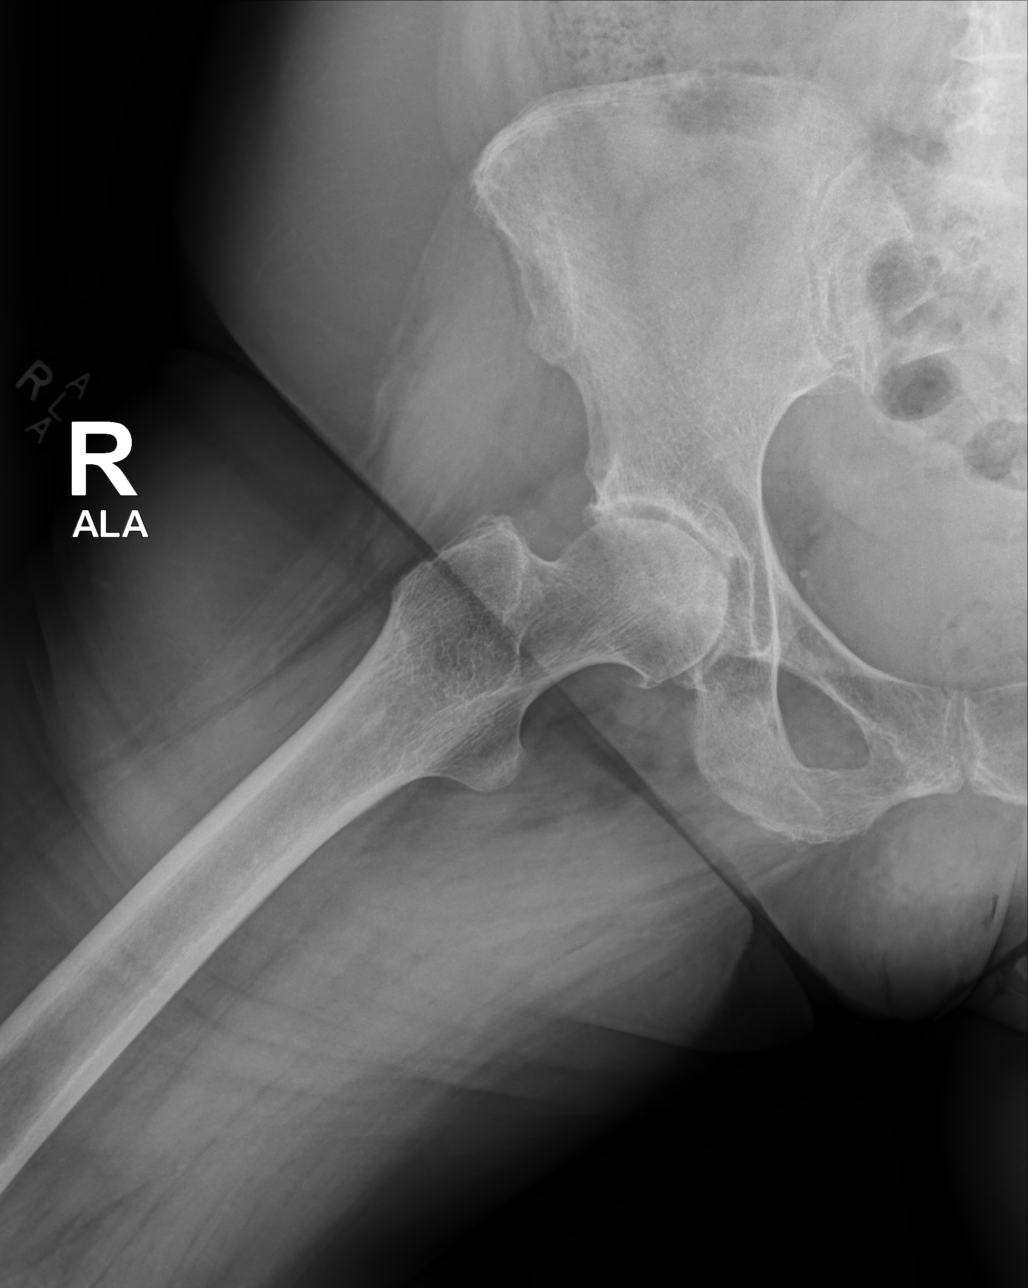

[3 of 3 positions shown; findings below may reference images not displayed]

FINDINGS: Pelvic ring is intact. Degenerative changes of right hip joint are
noted with sclerosis and subchondral cyst formation. No acute
fracture or dislocation is noted. No soft tissue changes noted.
IMPRESSION: Degenerative changes of the right hip joint. No acute abnormality is
noted.

## 2022-06-16 DIAGNOSIS — M25551 Pain in right hip: Secondary | ICD-10-CM | POA: Diagnosis not present

## 2022-06-20 ENCOUNTER — Encounter: Payer: Self-pay | Admitting: Internal Medicine

## 2022-06-25 DIAGNOSIS — Z1231 Encounter for screening mammogram for malignant neoplasm of breast: Secondary | ICD-10-CM | POA: Diagnosis not present

## 2022-06-25 LAB — HM MAMMOGRAPHY

## 2022-06-30 ENCOUNTER — Ambulatory Visit: Payer: Medicare PPO | Admitting: Physician Assistant

## 2022-07-01 ENCOUNTER — Encounter: Payer: Self-pay | Admitting: Internal Medicine

## 2022-08-12 DIAGNOSIS — M25551 Pain in right hip: Secondary | ICD-10-CM | POA: Diagnosis not present

## 2022-09-14 DIAGNOSIS — H52223 Regular astigmatism, bilateral: Secondary | ICD-10-CM | POA: Diagnosis not present

## 2022-09-14 DIAGNOSIS — H524 Presbyopia: Secondary | ICD-10-CM | POA: Diagnosis not present

## 2022-09-14 DIAGNOSIS — H2513 Age-related nuclear cataract, bilateral: Secondary | ICD-10-CM | POA: Diagnosis not present

## 2022-09-14 DIAGNOSIS — H53143 Visual discomfort, bilateral: Secondary | ICD-10-CM | POA: Diagnosis not present

## 2022-09-14 DIAGNOSIS — H5203 Hypermetropia, bilateral: Secondary | ICD-10-CM | POA: Diagnosis not present

## 2022-09-14 DIAGNOSIS — D3130 Benign neoplasm of unspecified choroid: Secondary | ICD-10-CM | POA: Diagnosis not present

## 2022-09-19 ENCOUNTER — Telehealth: Payer: Self-pay | Admitting: Internal Medicine

## 2022-09-19 ENCOUNTER — Encounter: Payer: Self-pay | Admitting: Internal Medicine

## 2022-09-19 NOTE — Telephone Encounter (Signed)
Left message for patient to call back and schedule Medicare Annual Wellness Visit (AWV) either virtually or in office. Left  my Herbie Drape number [phone]   Last AWV 10/11/21  ; please schedule at anytime with [facility] Nurse Health Advisor 1 or 2

## 2022-09-20 ENCOUNTER — Telehealth: Payer: Self-pay | Admitting: Internal Medicine

## 2022-09-20 NOTE — Telephone Encounter (Signed)
Spoke to patient to schedule AWV.  She received her covid booster in June.  And wanted to know if she needs to wait 6 months to get next booster.  She is leaving to go out of country and wants to know if she can get booster now.  She stated this booster is a different variant than what she got in June  Please call patient or put in my chart

## 2022-09-20 NOTE — Telephone Encounter (Signed)
Returned patient call   Patient returned call 09/19/22

## 2022-10-05 NOTE — Telephone Encounter (Signed)
Dont have an answer  backe up by data    but if traveling  a booster before travel  may give you a months protection from  infection  I am not aware of data of  risk benefit of early frequent "boosters)  So leave it up to you.

## 2022-10-11 NOTE — Telephone Encounter (Signed)
Immunization added.

## 2022-10-19 ENCOUNTER — Ambulatory Visit: Payer: Medicare PPO | Admitting: Physician Assistant

## 2022-10-31 ENCOUNTER — Ambulatory Visit (INDEPENDENT_AMBULATORY_CARE_PROVIDER_SITE_OTHER): Payer: Medicare PPO

## 2022-10-31 VITALS — Ht 61.0 in | Wt 180.0 lb

## 2022-10-31 DIAGNOSIS — Z0000 Encounter for general adult medical examination without abnormal findings: Secondary | ICD-10-CM | POA: Diagnosis not present

## 2022-10-31 NOTE — Progress Notes (Signed)
Subjective:   Samantha [name] is a 76 y.o. female who presents for Medicare Annual (Subsequent) preventive examination.  Review of Systems    ASSESSMENT:Virtual Visit via Telephone Note  I connected with  Samantha [name] on 10/31/22 at  2:00 PM EST by telephone and verified that I am speaking with the correct person using two identifiers.  Location: Patient: Home Provider: Office Persons participating in the virtual visit: patient/Nurse Health Advisor   I discussed the limitations, risks, security and privacy concerns of performing an evaluation and management service by telephone and the availability of in person appointments. The patient expressed understanding and agreed to proceed.  Interactive audio and video telecommunications were attempted between this nurse and patient, however failed, due to patient having technical difficulties OR patient did not have access to video capability.  We continued and completed visit with audio only.  Some vital signs may be absent or patient reported.   Samantha Peaches, LPN  Cardiac Risk Factors include: advanced age (>5mn, >>67women)     Objective:    Today's Vitals   10/31/22 1414  Weight: 180 lb (81.6 kg)  Height: '5\' 1"'$  (1.549 m)   Body mass index is 34.01 kg/m.     10/31/2022    2:22 PM 04/01/2022    3:13 PM 10/11/2021    3:15 PM 10/09/2020    1:20 PM  Advanced Directives  Does Patient Have a Medical Advance Directive? Yes Yes Yes Yes  Type of AParamedicof AAgencyLiving will [place] will [place] Fields[place] will [place] Does patient want to make changes to medical advance directive?    No - Patient declined  Copy of [place] Chart? No - copy requested  No - copy requested No - copy requested    Current Medications (verified) No outpatient encounter medications on file as of 10/31/2022.   No  facility-administered encounter medications on file as of 10/31/2022.    Allergies (verified) Patient has no known allergies.   History: Past Medical History:  Diagnosis Date   Colonic polyp    Past Surgical History:  Procedure Laterality Date   CHOLECYSTECTOMY     TONSILLECTOMY     Family History  Problem Relation Age of Onset   Cancer Sister        colon - 626  Heart attack Mother        623  Hypertension Mother    Other Daughter        murdered   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   Regular exercise -  Only ocass now  Hh of 1    etoh 1-2 per week   Daughter murdered in her house   Social Determinants of Health   Financial Resource Strain: Low Risk  (10/31/2022)   Overall Financial Resource Strain (CARDIA)    Difficulty of Paying Living Expenses: Not hard at all  Food Insecurity: No Food Insecurity (10/31/2022)   Hunger Vital Sign    Worried About Running Out of Food in the Last Year: Never true    Ran Out of Food in the Last Year: Never true  Transportation Needs: No Transportation Needs (10/31/2022)   PRAPARE - Hydrologist (Medical): No    Lack of Transportation (Non-Medical): No  Physical Activity: Inactive (10/31/2022)   Exercise Vital Sign    Days of Exercise per Week: 0 days    Minutes of Exercise per  Session: 0 min  Stress: No Stress Concern Present (10/31/2022)   [place]    Feeling of Stress : Not at all  Social Connections: Moderately Integrated (10/31/2022)   Social Connection and Isolation Panel [NHANES]    Frequency of Communication with Friends and Family: More than three times a week    Frequency of Social Gatherings with Friends and Family: More than three times a week    Attends Religious Services: More than 4 times per year    Active Member of [employer] or Organizations: Yes    Attends Music therapist: More than 4 times per year    Marital Status: Never married    Tobacco Counseling Counseling given: Not Answered   Clinical Intake:  Pre-visit preparation completed: No  Pain : No/denies pain     BMI - recorded: 34.01 Nutritional Status: BMI > 30  Obese Nutritional Risks: None Diabetes: No  How often do you need to have someone help you when you read instructions, pamphlets, or other written materials from your doctor or pharmacy?: 1 - Never  Diabetic?  No  Interpreter Needed?: No  Information entered by :: Samantha Arbour LPN   Activities of Daily Living    10/31/2022    2:21 PM  In your present state of health, do you have any difficulty performing the following activities:  Hearing? 0  Vision? 0  Difficulty concentrating or making decisions? 0  Walking or climbing stairs? 0  Dressing or bathing? 0  Doing errands, shopping? 0  Preparing Food and eating ? N  Using the Toilet? N  In the past six months, have you accidently leaked urine? N  Do you have problems with loss of bowel control? N  Managing your Medications? N  Managing your Finances? N  Housekeeping or managing your Housekeeping? N    Patient Care Team: Panosh, Standley Brooking, MD as PCP - General Nahser, Wonda Cheng, MD (Cardiology) Juanita Craver, MD as Attending Physician (Gastroenterology) Warren Danes, PA-C as  Physician Assistant (Dermatology)  Indicate any recent Medical Services you may have received from other than [facility] providers in the past year (date may be approximate).     Assessment:   This is a routine wellness examination for Samantha [name].  Hearing/Vision screen Hearing Screening - Comments:: Wears hearing aids Vision Screening - Comments:: Wears rx glasses - up to date with routine eye exams with  Dr Sabra Heck  Dietary issues and exercise activities discussed: Exercise limited by: orthopedic condition(s)   Goals Addressed               This  Visit's Progress     Stay Healthy (pt-stated)         Depression Screen    10/31/2022    2:19 PM 10/11/2021    3:13 PM 10/09/2020    1:22 PM 05/15/2018    1:43 PM 05/03/2017    2:58 PM 06/22/2016   11:15 AM 03/17/2014   11:29 AM  PHQ 2/9 Scores  PHQ - 2 Score 0 0 0 0 0 0 0  PHQ- 9 Score   0        Fall Risk    10/31/2022    2:21 PM 10/11/2021    3:16 PM 10/28/2020   12:47 PM 10/09/2020    1:21 PM 03/06/2019    3:24 PM  [place] in the past year? 0 0 0  0  Number falls in past yr: 0 0 0 0 0  Injury with Fall? 0 0 0 0 0  Risk for fall due to : No Fall Risks   No Fall Risks   Follow up Falls prevention discussed Falls evaluation completed  Falls evaluation completed;Falls prevention discussed Falls evaluation completed    FALL RISK PREVENTION PERTAINING TO THE HOME:  Any stairs in or around the home? Yes  If so, are there any without handrails? No  Home free of loose throw rugs in walkways, pet beds, electrical cords, etc? Yes  Adequate lighting in your home to reduce risk of falls? Yes   ASSISTIVE DEVICES UTILIZED TO PREVENT FALLS:  Life alert? No  Use of a cane, walker or w/c? Yes  Grab bars in the bathroom? No  Shower chair or bench in shower? Yes  Elevated toilet seat or a handicapped toilet? Yes   TIMED UP AND GO:  Was the test performed? No . Audio Visit   Cognitive Function:        10/31/2022    2:22  PM  6CIT Screen  What Year? 0 points  What month? 0 points  What time? 0 points  Count back from 20 0 points  Months in reverse 0 points  Repeat phrase 0 points  Total Score 0 points    Immunizations Immunization History  Administered Date(s) Administered   Hep A / Hep B 01/31/2017   Influenza Split 01/07/2013   Influenza, High Dose Seasonal PF 10/14/2013, 10/21/2016, 10/24/2017, 08/19/2019   Influenza,inj,Quad PF,6+ Mos 10/16/2014, 10/10/2018   Influenza-Unspecified 10/13/2021, 09/17/2022   PFIZER(Purple Top)SARS-COV-2 Vaccination 01/31/2020, 02/25/2020, 09/24/2020, 02/23/2021   Pfizer Covid-19 Vaccine Bivalent Booster 5y-11y 09/01/2021, 06/14/2022   Pneumococcal Conjugate-13 03/17/2014   Pneumococcal Polysaccharide-23 02/10/2012   Tdap 10/21/2016   Unspecified SARS-COV-2 Vaccination 10/05/2022   Zoster, Live 02/03/2010    TDAP status: Up to date  Flu Vaccine status: Up to date  Pneumococcal vaccine status: Up to date  Covid-19 vaccine status: Completed vaccines  Qualifies for Shingles Vaccine? Yes   Zostavax completed No   Shingrix Completed?: No.    Education has been provided regarding the importance of this vaccine. Patient has been advised to call insurance company to determine out of pocket expense if they have not yet received this vaccine. Advised may also receive vaccine at local pharmacy or Health Dept. Verbalized acceptance and understanding.  Screening Tests Health Maintenance  Topic Date Due   Zoster Vaccines- Shingrix (1 of 2) 01/31/2023 (Originally 10/20/1965)   DEXA SCAN  11/01/2023 (Originally 10/21/2011)   Hepatitis C Screening  11/01/2023 (Originally 10/20/1964)   COVID-19 Vaccine (8 - Pfizer risk series) 11/30/2022  MAMMOGRAM  06/26/2023   Medicare Annual Wellness (AWV)  11/01/2023   TETANUS/TDAP  10/21/2026   Pneumonia Vaccine 1+ Years old  Completed   INFLUENZA VACCINE  Completed   HPV VACCINES  Aged Out   COLONOSCOPY (Pts 45-24yr  Insurance coverage will need to be confirmed)  Discontinued    Health Maintenance  There are no preventive care reminders to display for this patient.   Colorectal cancer screening: No longer required.   Mammogram status: Completed 06/25/22. Repeat every year  Bone Density status: Ordered Patient deferred. Pt provided with contact info and advised to call to schedule appt.  Lung Cancer Screening: (Low Dose CT Chest recommended if Age 76-80years, 30 pack-year currently smoking OR have quit w/in 15years.) does not qualify.     Additional Screening:  Hepatitis C Screening: does qualify; Completed Patient deferred  Vision Screening: Recommended annual ophthalmology exams for early detection of glaucoma and other disorders of the eye. Is the patient up to date with their annual eye exam?  Yes  Who is the provider or what is the name of the office in which the patient attends annual eye exams? Dr MSabra HeckIf pt is not established with a provider, would they like to be referred to a provider to establish care? No .   Dental Screening: Recommended annual dental exams for proper oral hygiene  Community Resource Referral / Chronic Care Management:  CRR required this visit?  No   CCM required this visit?  No      Plan:     I have personally reviewed and noted the following in the patient's chart:   Medical and social history Use of alcohol, tobacco or illicit drugs  Current medications and supplements including opioid prescriptions. Patient is not currently taking opioid prescriptions. Functional ability and status Nutritional status Physical activity Advanced directives List of other physicians Hospitalizations, surgeries, and ER visits in previous 12 months Vitals Screenings to include cognitive, depression, and falls Referrals and appointments  In addition, I have reviewed and discussed with patient certain preventive protocols, quality metrics, and best practice  recommendations. A written personalized care plan for preventive services as well as general preventive health recommendations were provided to patient.     BCriselda Peaches LPN   [id number]  Nurse Notes: Patient due Hep-C Screening

## 2022-10-31 NOTE — Patient Instructions (Addendum)
Samantha [name] , Thank you for taking time to come for your Medicare Wellness Visit. I appreciate your ongoing commitment to your health goals. Please review the following plan we discussed and let me know if I can assist you in the future.   These are the goals we discussed:  Goals       Patient Stated      I will continue to walk 2 miles per day      Stay Healthy (pt-stated)        This is a list of the screening recommended for you and due dates:  Health Maintenance  Topic Date Due   Zoster (Shingles) Vaccine (1 of 2) 01/31/2023*   DEXA scan (bone density measurement)  11/01/2023*   Hepatitis C Screening: USPSTF Recommendation to screen - Ages 18-79 yo.  11/01/2023*   COVID-19 Vaccine (8 - Pfizer risk series) 11/30/2022   Mammogram  06/26/2023   Medicare Annual Wellness Visit  11/01/2023   Tetanus Vaccine  10/21/2026   Pneumonia Vaccine  Completed   Flu Shot  Completed   HPV Vaccine  Aged Out   Colon Cancer Screening  Discontinued  *Topic was postponed. The date shown is not the original due date.    Advanced directives: Please bring a copy of your health care power of attorney and living will to the office to be added to your chart at your convenience.   Conditions/risks identified: None  Next appointment: Follow up in one year for your annual wellness visit     Preventive Care 65 Years and Older, Female Preventive care refers to lifestyle choices and visits with your health care provider that can promote health and wellness. What does preventive care include? A yearly physical exam. This is also called an annual well check. Dental exams once or twice a year. Routine eye exams. Ask your health care provider how often you should have your eyes checked. Personal lifestyle choices, including: Daily care of your teeth and gums. Regular physical activity. Eating a healthy diet. Avoiding tobacco and drug use. Limiting alcohol use. Practicing safe sex. Taking low-dose  aspirin every day. Taking vitamin and mineral supplements as recommended by your health care provider. What happens during an annual well check? The services and screenings done by your health care provider during your annual well check will depend on your age, overall health, lifestyle risk factors, and family history of disease. Counseling  Your health care provider may ask you questions about your: Alcohol use. Tobacco use. Drug use. Emotional well-being. Home and relationship well-being. Sexual activity. Eating habits. History of falls. Memory and ability to understand (cognition). Work and work Statistician. Reproductive health. Screening  You may have the following tests or measurements: Height, weight, and BMI. Blood pressure. Lipid and cholesterol levels. These may be checked every 5 years, or more frequently if you are over 53 years old. Skin check. Lung cancer screening. You may have this screening every year starting at age 26 if you have a 30-pack-year history of smoking and currently smoke or have quit within the past 15 years. Fecal occult blood test (FOBT) of the stool. You may have this test every year starting at age 20. Flexible sigmoidoscopy or colonoscopy. You may have a sigmoidoscopy every 5 years or a colonoscopy every 10 years starting at age 78. Hepatitis C blood test. Hepatitis B blood test. Sexually transmitted disease (STD) testing. Diabetes screening. This is done by checking your blood sugar (glucose) after you have not eaten for  a while (fasting). You may have this done every 1-3 years. Bone density scan. This is done to screen for osteoporosis. You may have this done starting at age 20. Mammogram. This may be done every 1-2 years. Talk to your health care provider about how often you should have regular mammograms. Talk with your health care provider about your test results, treatment options, and if necessary, the need for more tests. Vaccines  Your  health care provider may recommend certain vaccines, such as: Influenza vaccine. This is recommended every year. Tetanus, diphtheria, and acellular pertussis (Tdap, Td) vaccine. You may need a Td booster every 10 years. Zoster vaccine. You may need this after age 60. Pneumococcal 13-valent conjugate (PCV13) vaccine. One dose is recommended after age 23. Pneumococcal polysaccharide (PPSV23) vaccine. One dose is recommended after age 63. Talk to your health care provider about which screenings and vaccines you need and how often you need them. This information is not intended to replace advice given to you by your health care provider. Make sure you discuss any questions you have with your health care provider. Document Released: 01/08/2016 Document Revised: 08/31/2016 Document Reviewed: 10/13/2015 Elsevier Interactive Patient Education  2017 [place] Prevention in the Home Falls can cause injuries. They can happen to people of all ages. There are many things you can do to make your home safe and to help prevent falls. What can I do on the outside of my home? Regularly fix the edges of walkways and driveways and fix any cracks. Remove anything that might make you trip as you walk through a door, such as a raised step or threshold. Trim any bushes or trees on the path to your home. Use bright outdoor lighting. Clear any walking paths of anything that might make someone trip, such as rocks or tools. Regularly check to see if handrails are loose or broken. Make sure that both sides of any steps have handrails. Any raised decks and porches should have guardrails on the edges. Have any leaves, snow, or ice cleared regularly. Use sand or salt on walking paths during winter. Clean up any spills in your garage right away. This includes oil or grease spills. What can I do in the bathroom? Use night lights. Install grab bars by the toilet and in the tub and shower. Do not use towel bars as  grab bars. Use non-skid mats or decals in the tub or shower. If you need to sit down in the shower, use a plastic, non-slip stool. Keep the floor dry. Clean up any water that spills on the floor as soon as it happens. Remove soap buildup in the tub or shower regularly. Attach bath mats securely with double-sided non-slip rug tape. Do not have throw rugs and other things on the floor that can make you trip. What can I do in the bedroom? Use night lights. Make sure that you have a light by your bed that is easy to reach. Do not use any sheets or blankets that are too big for your bed. They should not hang down onto the floor. Have a firm chair that has side arms. You can use this for support while you get dressed. Do not have throw rugs and other things on the floor that can make you trip. What can I do in the kitchen? Clean up any spills right away. Avoid walking on wet floors. Keep items that you use a lot in easy-to-reach places. If you need to reach something above  you, use a strong step stool that has a grab bar. Keep electrical cords out of the way. Do not use floor polish or wax that makes floors slippery. If you must use wax, use non-skid floor wax. Do not have throw rugs and other things on the floor that can make you trip. What can I do with my stairs? Do not leave any items on the stairs. Make sure that there are handrails on both sides of the stairs and use them. Fix handrails that are broken or loose. Make sure that handrails are as long as the stairways. Check any carpeting to make sure that it is firmly attached to the stairs. Fix any carpet that is loose or worn. Avoid having throw rugs at the top or bottom of the stairs. If you do have throw rugs, attach them to the floor with carpet tape. Make sure that you have a light switch at the top of the stairs and the bottom of the stairs. If you do not have them, ask someone to add them for you. What else can I do to help prevent  falls? Wear shoes that: Do not have high heels. Have rubber bottoms. Are comfortable and fit you well. Are closed at the toe. Do not wear sandals. If you use a stepladder: Make sure that it is fully opened. Do not climb a closed stepladder. Make sure that both sides of the stepladder are locked into place. Ask someone to hold it for you, if possible. Clearly mark and make sure that you can see: Any grab bars or handrails. First and last steps. Where the edge of each step is. Use tools that help you move around (mobility aids) if they are needed. These include: Canes. Walkers. Scooters. Crutches. Turn on the lights when you go into a dark area. Replace any light bulbs as soon as they burn out. Set up your furniture so you have a clear path. Avoid moving your furniture around. If any of your floors are uneven, fix them. If there are any pets around you, be aware of where they are. Review your medicines with your doctor. Some medicines can make you feel dizzy. This can increase your chance of falling. Ask your doctor what other things that you can do to help prevent falls. This information is not intended to replace advice given to you by your health care provider. Make sure you discuss any questions you have with your health care provider. Document Released: 10/08/2009 Document Revised: 05/19/2016 Document Reviewed: 01/16/2015 Elsevier Interactive Patient Education  2017 [employer].

## 2022-11-01 DIAGNOSIS — M25551 Pain in right hip: Secondary | ICD-10-CM | POA: Diagnosis not present

## 2023-01-23 ENCOUNTER — Ambulatory Visit: Payer: Medicare PPO | Attending: Internal Medicine | Admitting: Internal Medicine

## 2023-01-23 ENCOUNTER — Encounter: Payer: Self-pay | Admitting: Internal Medicine

## 2023-01-23 VITALS — BP 114/82 | HR 78 | Ht 61.5 in | Wt 185.6 lb

## 2023-01-23 DIAGNOSIS — R0600 Dyspnea, unspecified: Secondary | ICD-10-CM | POA: Diagnosis not present

## 2023-01-23 MED ORDER — METOPROLOL TARTRATE 100 MG PO TABS: 100.0000 mg | ORAL_TABLET | Freq: Once | ORAL | 0 refills | Status: DC

## 2023-01-23 NOTE — Progress Notes (Signed)
Cardiology Office Note:    Date:  01/23/2023   ID:  Samantha [name], DOB 03-21-1946, MRN [id number]  PCP:  Burnis Medin, MD   [place] Providers Cardiologist:  None     Referring MD: Burnis Medin, MD   No chief complaint on file. SOB  History of Present Illness:    Samantha [name] is a 77 y.o. female with no hx of cardiac dx, saw Dr. Acie Fredrickson for non cardiac CP. Had ETT She has not seen cardiology since.  Notes that she was eating chicken and she develop significant pain. It felt indigestion. This was intense pain. Also noted some back pain. This was 1 week ago. The pain has not recurred. For exercise, she walks. She has been doing physical therapy. She notes with an incline she gets SOB.  Family Hx: Mother had MI 48.Father had cancer  Social Hx: no smoking  The 10-year ASCVD risk score (Arnett DK, et al., 2019) is: 14.6%   Values used to calculate the score:     Age: 47 years     Sex: Female     Is Non-Hispanic African American: No     Diabetic: No     Tobacco smoker: No     Systolic Blood Pressure: 563 mmHg     Is BP treated: No     HDL Cholesterol: 68.5 mg/dL     Total Cholesterol: 192 mg/dL   Past Medical History:  Diagnosis Date   Colonic polyp     Past Surgical History:  Procedure Laterality Date   CHOLECYSTECTOMY     TONSILLECTOMY      Current Medications: Current Meds  Medication Sig   metoprolol tartrate (LOPRESSOR) 100 MG tablet Take 1 tablet (100 mg total) by mouth once for 1 dose.     Allergies:   Patient has no known allergies.   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   Regular exercise -  Only ocass now   [place] of 1    etoh 1-2 per week   Daughter murdered in her house   Social Determinants of Health   Financial Resource Strain: [place]  (10/31/2022)   Overall Financial Resource Strain (CARDIA)    Difficulty of Paying Living Expenses: Not hard at all  Food Insecurity: No Food Insecurity (10/31/2022)   Hunger Vital Sign    Worried About Running Out of Food in the Last Year: Never true    [place] in the Last Year: Never true  Transportation Needs: No Transportation Needs (10/31/2022)   PRAPARE - Hydrologist (Medical): No    Lack of Transportation (Non-Medical): No  Physical Activity: Inactive (10/31/2022)   Exercise Vital Sign    Days of Exercise per Week: 0 days    Minutes of Exercise per Session: 0 min  Stress: No Stress Concern Present (10/31/2022)   [place]    Feeling of Stress : Not at all  Social Connections: Moderately Integrated (10/31/2022)   Social Connection and Isolation Panel [NHANES]    Frequency of Communication with Friends and Family: More than three times a week    Frequency of Social Gatherings with Friends and Family: More than three times a week    Attends Religious Services: More than 4 times per year    Active Member of [employer] or Organizations: Yes    Attends Music therapist: More than 4 times per year    Marital Status: Never married     Family History: The patient's family history includes Cancer in her sister; Heart attack in her mother; Hypertension in her mother; Other in her daughter.  ROS:   Please see the history of present illness.      All other systems reviewed and are negative.  EKGs/Labs/Other Studies Reviewed:    The following studies were reviewed today:   EKG:  EKG is  ordered today.  The ekg ordered today demonstrates   01/23/2023- LBBB  Recent Labs: No results found for requested labs within last 365 days.   Recent Lipid Panel    Component Value Date/Time   CHOL 192 11/08/2021 1157   TRIG 105.0 11/08/2021 1157   HDL 68.50 11/08/2021 1157   CHOLHDL 3 11/08/2021 1157   VLDL 21.0 11/08/2021 1157   LDLCALC 102 (H) 11/08/2021 1157   LDLDIRECT 136.0 05/15/2018 1446     Risk Assessment/Calculations:         Physical Exam:    VS:   Vitals:   01/23/23 0839  BP: 114/82  Pulse: 78  SpO2: 95%    Wt Readings from Last 3 Encounters:  01/23/23 185 lb 9.6 oz (84.2 kg)  10/31/22 180 lb (81.6 kg)  11/08/21 175 lb 9.6 oz (79.7 kg)     GEN:  Well nourished, well developed in no acute distress HEENT: Normal NECK: No JVD; No carotid bruits LYMPHATICS: No lymphadenopathy CARDIAC: RRR, no murmurs, rubs, gallops RESPIRATORY:  Clear to auscultation without rales, wheezing or rhonchi  ABDOMEN: Soft, non-tender, non-distended MUSCULOSKELETAL:  No edema; No deformity  SKIN: Warm and dry NEUROLOGIC:  Alert and oriented x 3 PSYCHIATRIC:  Normal affect   ASSESSMENT:    SOB: She has new LBBB, unclear when she developed this between 2013 and now. Her CVD risk is increased with age. Will plan to obtain a TTE and coronary CTA to assess for ischemic cardiac dx.  PLAN:    In order of problems listed above:  TTE Coronary CTA morph 100 metop tartrate Follow up 3 months       Medication Adjustments/Labs and Tests Ordered: Current medicines are reviewed at length with the patient today.  Concerns regarding medicines are outlined above.  Orders Placed This Encounter  Procedures   CT CORONARY MORPH W/CTA COR W/SCORE W/CA W/CM &/OR WO/CM   Basic metabolic panel   EKG 93-ATFT   ECHOCARDIOGRAM COMPLETE    Meds ordered this encounter  Medications   metoprolol tartrate (LOPRESSOR) 100 MG tablet    Sig: Take 1 tablet (100 mg total) by mouth once for 1 dose.    Dispense:  1 tablet  Refill:  0    Patient Instructions  Medication Instructions:  Your physician recommends that you continue on your current medications as directed. Please refer to the Current Medication list given to you today.  *If you need a refill on your cardiac medications before your next appointment, please call your pharmacy*   Lab Work: Your physician recommends that you have labs drawn today: BMET  If you have labs (blood work) drawn today and your tests are completely normal, you will receive your results only by: [place] (if you have MyChart) OR A paper copy in the mail If you have any lab test that is abnormal or we need to change your treatment, we will call you to review the results.   Testing/Procedures: Your physician has requested that you have an echocardiogram. Echocardiography is a painless test that uses sound waves to create images of your heart. It provides your doctor with information about the size and shape of your heart and how well your heart's chambers and valves are working. This procedure takes approximately one hour. There are no restrictions for this procedure. Please do NOT wear cologne, perfume, aftershave, or lotions (deodorant is allowed). Please arrive 15 minutes prior to your appointment time. This procedure will be done at [address]    Follow-Up: At [facility], you and your health needs are our priority.  As part of our continuing mission to provide you with exceptional heart care, we have created designated Provider Care Teams.  These Care Teams include your primary Cardiologist (physician) and Advanced Practice Providers (APPs -  Physician Assistants and Nurse Practitioners) who all work together to provide you with the care you need, when you  need it.  We recommend signing up for the patient portal called "MyChart".  Sign up information is provided on this After Visit Summary.  MyChart is used to connect with patients for Virtual Visits (Telemedicine).  Patients are able to view lab/test results, encounter notes, upcoming appointments, etc.  Non-urgent messages can be sent to your provider as well.   To learn more about what you can do with MyChart, go to [email or web address].    Your next appointment:   3 month(s)  Provider:   Phineas Inches, MD  Other Instructions   Your cardiac CT will be scheduled at the below location:   [facility] [address] [place], [place] [address] [phone]   If scheduled at [facility], please arrive at the [facility] and Children's Entrance (Entrance C2) of [facility] 30 minutes prior to test start time. You can use the FREE valet parking offered at entrance C (encouraged to control the heart rate for the test)  Proceed to the [facility] Radiology Department (first floor) to check-in and test prep.  All radiology patients and guests should use entrance C2 at [facility], accessed from [facility], even though the hospital's physical address listed is [address]     Please follow these instructions carefully (unless otherwise directed):   On the Night Before the Test: Be sure to Drink plenty of water. Do not consume any caffeinated/decaffeinated beverages or chocolate 12 hours prior to your test. Do not take any antihistamines 12 hours prior to your test.  On the Day of the Test: Drink plenty of water until 1 hour prior to the test. Do not eat any food 1 hour prior to test. You may take your regular medications prior to  the test.  Take metoprolol (Lopressor) '100mg'$  two hours prior to test. FEMALES- please wear underwire-free bra if available, avoid dresses & tight clothing       After the Test: Drink plenty of  water. After receiving IV contrast, you may experience a mild flushed feeling. This is normal. On occasion, you may experience a mild rash up to 24 hours after the test. This is not dangerous. If this occurs, you can take Benadryl 25 mg and increase your fluid intake. If you experience trouble breathing, this can be serious. If it is severe call 911 IMMEDIATELY. If it is mild, please call our office. If you take any of these medications: Glipizide/Metformin, Avandament, Glucavance, please do not take 48 hours after completing test unless otherwise instructed.  We will call to schedule your test 2-4 weeks out understanding that some insurance companies will need an authorization prior to the service being performed.   For non-scheduling related questions, please contact the cardiac imaging nurse navigator should you have any questions/concerns: Marchia Bond, Cardiac Imaging Nurse Navigator Gordy Clement, Cardiac Imaging Nurse Navigator [facility] Direct Office Dial: [phone]   For scheduling needs, including cancellations and rescheduling, please call [place], [phone].     Signed, Janina Mayo, MD  01/23/2023 10:00 AM    [place]

## 2023-01-23 NOTE — Patient Instructions (Addendum)
Medication Instructions:  Your physician recommends that you continue on your current medications as directed. Please refer to the Current Medication list given to you today.  *If you need a refill on your cardiac medications before your next appointment, please call your pharmacy*   Lab Work: Your physician recommends that you have labs drawn today: BMET  If you have labs (blood work) drawn today and your tests are completely normal, you will receive your results only by: [place] (if you have MyChart) OR A paper copy in the mail If you have any lab test that is abnormal or we need to change your treatment, we will call you to review the results.   Testing/Procedures: Your physician has requested that you have an echocardiogram. Echocardiography is a painless test that uses sound waves to create images of your heart. It provides your doctor with information about the size and shape of your heart and how well your heart's chambers and valves are working. This procedure takes approximately one hour. There are no restrictions for this procedure. Please do NOT wear cologne, perfume, aftershave, or lotions (deodorant is allowed). Please arrive 15 minutes prior to your appointment time. This procedure will be done at [address]    Follow-Up: At [facility], you and your health needs are our priority.  As part of our continuing mission to provide you with exceptional heart care, we have created designated Provider Care Teams.  These Care Teams include your primary Cardiologist (physician) and Advanced Practice Providers (APPs -  Physician Assistants and Nurse Practitioners) who all work together to provide you with the care you need, when you need it.  We recommend signing up for the patient portal called "MyChart".  Sign up information is provided on this After Visit Summary.  MyChart is used to connect with patients for Virtual Visits (Telemedicine).  Patients  are able to view lab/test results, encounter notes, upcoming appointments, etc.  Non-urgent messages can be sent to your provider as well.   To learn more about what you can do with MyChart, go to [email or web address].    Your next appointment:   3 month(s)  Provider:   Phineas Inches, MD  Other Instructions   Your cardiac CT will be scheduled at the below location:   [facility] [address] [place], [place] [address] [phone]   If scheduled at [facility], please arrive at the [facility] and Children's Entrance (Entrance C2) of [facility] 30 minutes prior to test start time. You can use the FREE valet parking offered at entrance C (encouraged to control the heart rate for the test)  Proceed to the [facility] Radiology Department (first floor) to check-in and test prep.  All radiology patients and guests should use entrance C2 at [facility], accessed from [facility], even though the hospital's physical address listed is [address]     Please follow these instructions carefully (unless otherwise directed):   On the Night Before the Test: Be sure to Drink plenty of water. Do not consume any caffeinated/decaffeinated beverages or chocolate 12 hours prior to your test. Do not take any antihistamines 12 hours prior to your test.  On the Day of the Test: Drink plenty of water until 1 hour prior to the test. Do not eat any food 1 hour prior to test. You may take your regular medications prior to the test.  Take metoprolol (Lopressor) '100mg'$  two hours  prior to test. FEMALES- please wear underwire-free bra if available, avoid dresses & tight clothing       After the Test: Drink plenty of water. After receiving IV contrast, you may experience a mild flushed feeling. This is normal. On occasion, you may experience a mild rash up to 24 hours after the test. This is not dangerous. If this occurs, you can take  Benadryl 25 mg and increase your fluid intake. If you experience trouble breathing, this can be serious. If it is severe call 911 IMMEDIATELY. If it is mild, please call our office. If you take any of these medications: Glipizide/Metformin, Avandament, Glucavance, please do not take 48 hours after completing test unless otherwise instructed.  We will call to schedule your test 2-4 weeks out understanding that some insurance companies will need an authorization prior to the service being performed.   For non-scheduling related questions, please contact the cardiac imaging nurse navigator should you have any questions/concerns: Marchia Bond, Cardiac Imaging Nurse Navigator Gordy Clement, Cardiac Imaging Nurse Navigator [facility] Direct Office Dial: [phone]   For scheduling needs, including cancellations and rescheduling, please call [place], [phone].

## 2023-01-24 LAB — BASIC METABOLIC PANEL
BUN/Creatinine Ratio: 26 (ref 12–28)
BUN: 14 mg/dL (ref 8–27)
CO2: 25 mmol/L (ref 20–29)
Calcium: 9.1 mg/dL (ref 8.7–10.3)
Chloride: 105 mmol/L (ref 96–106)
Creatinine, Ser: 0.54 mg/dL — ABNORMAL LOW (ref 0.57–1.00)
Glucose: 75 mg/dL (ref 70–99)
Potassium: 4.7 mmol/L (ref 3.5–5.2)
Sodium: 143 mmol/L (ref 134–144)
eGFR: 95 mL/min/{1.73_m2} (ref >59)

## 2023-01-25 ENCOUNTER — Telehealth (HOSPITAL_COMMUNITY): Payer: Self-pay | Admitting: *Deleted

## 2023-01-25 NOTE — Telephone Encounter (Signed)
Reaching out to patient to offer assistance regarding upcoming cardiac imaging study; pt verbalizes understanding of appt date/time, parking situation and where to check in, pre-test NPO status and medications ordered, and verified current allergies; name and call back number provided for further questions should they arise  Gordy Clement RN Navigator Cardiac Imaging [facility] [phone] office [phone] cell  Patient to take '100mg'$  metoprolol tartrate two hours prior to her cardiac CT scan. She is aware to arrive at 12pm.

## 2023-01-26 ENCOUNTER — Ambulatory Visit (HOSPITAL_COMMUNITY)
Admission: RE | Admit: 2023-01-26 | Discharge: 2023-01-26 | Disposition: A | Discharge status: Home / Self Care | Payer: Medicare PPO | Source: Ambulatory Visit | Attending: Internal Medicine | Admitting: Internal Medicine

## 2023-01-26 DIAGNOSIS — I447 Left bundle-branch block, unspecified: Secondary | ICD-10-CM

## 2023-01-26 DIAGNOSIS — I2510 Atherosclerotic heart disease of native coronary artery without angina pectoris: Secondary | ICD-10-CM | POA: Diagnosis not present

## 2023-01-26 DIAGNOSIS — R0600 Dyspnea, unspecified: Secondary | ICD-10-CM | POA: Insufficient documentation

## 2023-01-26 MED ORDER — NITROGLYCERIN 0.4 MG SL SUBL
0.8000 mg | SUBLINGUAL_TABLET | Freq: Once | SUBLINGUAL | Status: AC
  Administered 2023-01-26: 0.8 mg via SUBLINGUAL

## 2023-01-26 MED ORDER — NITROGLYCERIN 0.4 MG SL SUBL
SUBLINGUAL_TABLET | SUBLINGUAL | Status: AC
  Filled 2023-01-26: qty 2

## 2023-01-26 MED ORDER — IOHEXOL 350 MG/ML SOLN
100.0000 mL | Freq: Once | INTRAVENOUS | Status: AC | PRN
  Administered 2023-01-26: 100 mL via INTRAVENOUS

## 2023-01-27 ENCOUNTER — Encounter: Payer: Self-pay | Admitting: Internal Medicine

## 2023-01-30 ENCOUNTER — Telehealth: Payer: Self-pay | Admitting: Internal Medicine

## 2023-01-30 ENCOUNTER — Encounter: Payer: Self-pay | Admitting: Family Medicine

## 2023-01-30 ENCOUNTER — Ambulatory Visit: Payer: Medicare PPO | Admitting: Family Medicine

## 2023-01-30 VITALS — BP 142/82 | HR 80 | Temp 97.6°F | Ht 61.5 in | Wt 184.3 lb

## 2023-01-30 DIAGNOSIS — R030 Elevated blood-pressure reading, without diagnosis of hypertension: Secondary | ICD-10-CM | POA: Diagnosis not present

## 2023-01-30 DIAGNOSIS — I7121 Aneurysm of the ascending aorta, without rupture: Secondary | ICD-10-CM | POA: Diagnosis not present

## 2023-01-30 DIAGNOSIS — L821 Other seborrheic keratosis: Secondary | ICD-10-CM

## 2023-01-30 NOTE — Progress Notes (Signed)
Established Patient Office Visit  Subjective   Patient ID: Samantha [name], female    DOB: 1946/01/24  Age: 77 y.o. MRN: [id number]  Chief Complaint  Patient presents with   [place]   Referral    HPI   Samantha [name] is seen today for the following issues  She has a couple of brown spots on her face including left nose and left brow region.  These been present for some time.  No itching or bleeding.  She try to get into see dermatologist but was told would be about 8 months.  No known history of skin cancer.  No history of hypertension but did have initial reading here today 160/90.  Occasional alcohol use but no binge drinking.  No recent headaches or dizziness.  She has home blood pressure cuff but does not monitor.  She had questions regarding recent CT morphology study.  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  No dissection.  Fortunately, her coronary calcium score is 0.  She is non-smoker.  Takes no regular medications  Past Medical History:  Diagnosis Date   Colonic polyp    Past Surgical History:  Procedure Laterality Date   CHOLECYSTECTOMY     TONSILLECTOMY      reports that she has never smoked. She has never used smokeless tobacco. She reports current alcohol use of about 10.0 standard drinks of alcohol per week. She reports that she does not use drugs. family history includes Cancer in her sister; Heart attack in her mother; Hypertension in her mother; Other in her daughter. No Known Allergies  Review of Systems  Constitutional:  Negative for malaise/fatigue.  Eyes:  Negative for blurred vision.  Respiratory:  Negative for shortness of breath.   Cardiovascular:  Negative for chest pain.  Neurological:  Negative for dizziness, weakness and headaches.      Objective:     BP (!) 142/82 (BP Location: Left Arm, Cuff Size: Large)   Pulse 80   Temp 97.6 F (36.4 C) (Oral)   Ht 5' 1.5" (1.562 m)   Wt 184 lb 4.8 oz (83.6 kg)   SpO2 98%    BMI 34.26 kg/m  BP Readings from Last 3 Encounters:  01/30/23 (!) 142/82  01/26/23 134/66  01/23/23 114/82   Wt Readings from Last 3 Encounters:  01/30/23 184 lb 4.8 oz (83.6 kg)  01/23/23 185 lb 9.6 oz (84.2 kg)  10/31/22 180 lb (81.6 kg)      Physical Exam Vitals reviewed.  Constitutional:      Appearance: She is well-developed.  Eyes:     Pupils: Pupils are equal, round, and reactive to light.  Neck:     Thyroid: No thyromegaly.     Vascular: No JVD.  Cardiovascular:     Rate and Rhythm: Normal rate and regular rhythm.     Heart sounds:     No gallop.  Pulmonary:     Effort: Pulmonary effort is normal. No respiratory distress.     Breath sounds: Normal breath sounds. No wheezing or rales.  Musculoskeletal:     Cervical back: Neck supple.     Right lower leg: No edema.     Left lower leg: No edema.  Skin:    Comments: She has a well-demarcated brownish scaly lesion left side of nose near the bridge--about 4 mm diameter.  No atypical features.  Very similar small lesion about 3 mm diameter left lateral brow region  Neurological:  Mental Status: She is alert.      No results found for any visits on 01/30/23.    The 10-year ASCVD risk score (Arnett DK, et al., 2019) is: 21.6%    Assessment & Plan:   #1 benign appearing seborrheic keratosis left side of face.  Reassurance given.  #2 elevated blood pressure without history of hypertension.  This did come down some after rest.  We discussed nonpharmacologic management with weight loss, sodium reduction, moderation in alcohol intake, and regular aerobic exercise.  She will monitor for now.  She has scheduled follow-up with cardiology soon.  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  We explained this will likely need to be monitored annually.  She plans to discuss further with cardiology.  Carolann Littler, MD

## 2023-01-30 NOTE — Patient Instructions (Signed)
The facial lesions are seborrheic keratoses which are benign.

## 2023-01-30 NOTE — Telephone Encounter (Signed)
Pt requesting provider switch to Dr. Acie Fredrickson being that she saw him in the past. Please advise

## 2023-02-06 ENCOUNTER — Encounter: Payer: Self-pay | Admitting: Internal Medicine

## 2023-02-06 ENCOUNTER — Encounter: Payer: Self-pay | Admitting: Cardiovascular Disease

## 2023-02-06 ENCOUNTER — Ambulatory Visit: Payer: Medicare PPO | Attending: Internal Medicine | Admitting: Cardiovascular Disease

## 2023-02-06 VITALS — BP 132/78 | HR 82 | Ht 62.0 in | Wt 188.2 lb

## 2023-02-06 DIAGNOSIS — I77819 Aortic ectasia, unspecified site: Secondary | ICD-10-CM | POA: Diagnosis not present

## 2023-02-06 DIAGNOSIS — Z79899 Other long term (current) drug therapy: Secondary | ICD-10-CM

## 2023-02-06 DIAGNOSIS — E782 Mixed hyperlipidemia: Secondary | ICD-10-CM | POA: Diagnosis not present

## 2023-02-06 DIAGNOSIS — I447 Left bundle-branch block, unspecified: Secondary | ICD-10-CM

## 2023-02-06 NOTE — Telephone Encounter (Signed)
Error

## 2023-02-06 NOTE — Patient Instructions (Signed)
Medication Instructions:  Your physician recommends that you continue on your current medications as directed. Please refer to the Current Medication list given to you today.  *If you need a refill on your cardiac medications before your next appointment, please call your pharmacy*   Lab Work: Lipids, LP(a) today If you have labs (blood work) drawn today and your tests are completely normal, you will receive your results only by: [place] (if you have MyChart) OR A paper copy in the mail If you have any lab test that is abnormal or we need to change your treatment, we will call you to review the results.   Testing/Procedures: CTA of Aorta in 1 year Your physician has requested that you have cardiac CT. Cardiac computed tomography (CT) is a painless test that uses an x-ray machine to take clear, detailed pictures of your heart. For further information please visit [email or web address]. Please follow instruction sheet as given.  Follow-Up: At [facility], you and your health needs are our priority.  As part of our continuing mission to provide you with exceptional heart care, we have created designated Provider Care Teams.  These Care Teams include your primary Cardiologist (physician) and Advanced Practice Providers (APPs -  Physician Assistants and Nurse Practitioners) who all work together to provide you with the care you need, when you need it.  Your next appointment:   6 month(s)  Provider:   Mertie Moores, MD

## 2023-02-06 NOTE — Progress Notes (Signed)
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 She has been having exertional intrascapular pain for the past several months. He typically hurts if she climbs several flights of stairs or walks up a hill.  There is no associated dyspnea or syncope.  I've seen her in the remote past. We performed a stress test results which cannot be found today but she reports that they were normal.   Feb. 12, 2024 Samantha [name] is a 77 y.o. female with a hx of chest pain  She was last seen 11 years ago    Has had recent CP Her mother died of an MI  She had CP while eating chicken .  Thought it may have been indigestion   She has been seen by Dr. Beckie Busing recently  Was found to have a LBBB .  Echo and cor CTA were ordered.   Coronary CT angiogram was performed.  This revealed very mildly dilated ascending thoracic aorta measuring 4.0 cm.  They recommended annual follow-up for this.  ( She is very concerned about this )   Advised her to avoid flouroquinolones due to possible worsening of aortic aneurisms .    Coronaries:  CAC  score is 0  RCA :   mild soft plaque LM : normal  LAD :  normal  LCx :   normal   Echo - scheduled    She walks regularly ,   only has DOE if she is walking up a hill or if she is pushing the pace .  Walks 2 miles several times a week . No CP with walking   Wt is 188 .   LDL was 102  in Nov. 2022  Will repeat her LDL and LP(A)    Past Medical History:  Diagnosis Date   Colonic polyp     Past Surgical History:  Procedure Laterality Date   CHOLECYSTECTOMY     TONSILLECTOMY      Current  Medications: No outpatient medications have been marked as taking for the 02/06/23 encounter (Office Visit) with Brittnei Jagiello, Wonda Cheng, MD.     Allergies:   Patient has no known allergies.   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   Regular exercise -  Only ocass now   Hh of 1    etoh 1-2 per week   Daughter murdered in her house   Social Determinants of Health   Financial Resource Strain: Low Risk  (10/31/2022)   Overall Financial Resource Strain (CARDIA)    Difficulty of Paying Living Expenses: Not hard at all  Food Insecurity: No Food Insecurity (10/31/2022)   Hunger Vital Sign    Worried About Running Out of Food in the Last Year: Never true    Ran Out of Food in the Last Year: Never true  Transportation Needs: No Transportation Needs (10/31/2022)   PRAPARE - Hydrologist (Medical): No    Lack of Transportation (Non-Medical): No  Physical Activity: Inactive (10/31/2022)   Exercise Vital Sign    Days of Exercise per Week: 0 days    Minutes of Exercise per Session: 0 min  Stress: No Stress Concern Present (10/31/2022)   [place]    Feeling of Stress : Not at all  Social Connections: Moderately Integrated (10/31/2022)   Social Connection and Isolation Panel [NHANES]     Frequency of Communication with Friends and Family: More than three times a week    Frequency of Social Gatherings with Friends and Family: More than three times a week    Attends Religious Services: More than 4 times per year    Active Member of [employer] or Organizations: Yes    Attends Music therapist: More than 4 times per year    Marital Status: Never married     Family History: The patient's family history includes Cancer in her sister; Heart attack in her mother; Hypertension in her mother; Other in her daughter.  ROS:   Please see the history of present illness.     All other systems reviewed and are negative.  EKGs/Labs/Other Studies Reviewed:    The following studies were reviewed today:   EKG:   January 23, 2023: Normal sinus rhythm at 78.  Left bundle branch block.  Recent Labs: 01/23/2023: BUN 14; Creatinine, Ser 0.54; Potassium 4.7; Sodium 143  Recent Lipid Panel    Component Value Date/Time   CHOL 192 11/08/2021 1157   TRIG 105.0 11/08/2021 1157   HDL 68.50 11/08/2021 1157   CHOLHDL 3 11/08/2021 1157   VLDL 21.0 11/08/2021 1157   LDLCALC 102 (H) 11/08/2021 1157   LDLDIRECT 136.0 05/15/2018 1446     Risk Assessment/Calculations:                Physical Exam:    VS:  BP 132/78   Pulse 82   Ht 5' 2"$  (1.575 m)   Wt 188 lb 3.2 oz (85.4 kg)   SpO2 97%   BMI 34.42 kg/m     Wt Readings from Last 3 Encounters:  02/06/23 188 lb 3.2 oz (85.4 kg)  01/30/23 184 lb 4.8 oz (83.6 kg)  01/23/23 185 lb 9.6 oz (84.2 kg)     GEN:  Well nourished, well developed in no acute distress HEENT: Normal NECK: No JVD; No carotid bruits LYMPHATICS: No lymphadenopathy CARDIAC: RRR, no murmurs, rubs, gallops RESPIRATORY:  Clear to auscultation without rales, wheezing or rhonchi  ABDOMEN: Soft, non-tender, non-distended MUSCULOSKELETAL:  No edema; No deformity  SKIN: Warm and dry NEUROLOGIC:  Alert and oriented x 3 PSYCHIATRIC:  Normal affect    ASSESSMENT:    1. Aortic dilatation (HCC)   2. LBBB (left bundle branch block)  3. Mixed hyperlipidemia    PLAN:       LBBB  -coronary CT angiogram reveals only minor coronary artery irregularities.  No ischemic heart disease to explain her left bundle branch block.  Echocardiogram has been ordered and will be done later this week.  Based on the results of the echo we may start her on low-dose angiotensin blocker.  If she has significant LV dysfunction we may need to consider Entresto and other GDMT for CHF   Will continue to follow up   2.  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  Will recheck a CT angiogram of the of the aorta next year.  3.  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  I would like for her LDL to be lower.  Her last LDL was 102.  Will drawl repeat lipids today as well as a LP(a).  4.  Hyperlipidemia : LDL from November, 2022 was 102.  Will repeat today.  Will also draw LP(a).   Medication Adjustments/Labs and Tests Ordered: Current medicines are reviewed at length with the patient today.  Concerns regarding medicines are outlined above.  Orders Placed This Encounter  Procedures   CT ANGIO CHEST AORTA W/CM & OR WO/CM   Lipid panel   Lipoprotein A (LPA)   No orders of the defined types were placed in this encounter.   Patient Instructions  Medication Instructions:  Your physician recommends that you continue on your current medications as directed. Please refer to the Current Medication list given to you today.  *If you need a refill on your cardiac medications before your next appointment, please call your pharmacy*   Lab Work: Lipids, LP(a) today If you have labs (blood work) drawn today and your tests are completely normal, you will receive your results only by: [place] (if you have MyChart) OR A paper copy in the mail If you have any lab test that is abnormal or we need to change your treatment, we will  call you to review the results.   Testing/Procedures: CTA of Aorta in 1 year Your physician has requested that you have cardiac CT. Cardiac computed tomography (CT) is a painless test that uses an x-ray machine to take clear, detailed pictures of your heart. For further information please visit [email or web address]. Please follow instruction sheet as given.  Follow-Up: At [facility], you and your health needs are our priority.  As part of our continuing mission to provide you with exceptional heart care, we have created designated Provider Care Teams.  These Care Teams include your primary Cardiologist (physician) and Advanced Practice Providers (APPs -  Physician Assistants and Nurse Practitioners) who all work together to provide you with the care you need, when you need it.  Your next appointment:   6 month(s)  Provider:   Mertie Moores, MD     Signed, Mertie Moores, MD  02/06/2023 3:33 PM    [place]

## 2023-02-07 LAB — LIPID PANEL
Chol/HDL Ratio: 2.7 ratio (ref 0.0–4.4)
Cholesterol, Total: 205 mg/dL — ABNORMAL HIGH (ref 100–199)
HDL: 75 mg/dL
LDL Chol Calc (NIH): 97 mg/dL (ref 0–99)
Triglycerides: 194 mg/dL — ABNORMAL HIGH (ref 0–149)
VLDL Cholesterol Cal: 33 mg/dL (ref 5–40)

## 2023-02-07 LAB — LIPOPROTEIN A (LPA): Lipoprotein (a): 12 nmol/L

## 2023-02-10 ENCOUNTER — Ambulatory Visit (HOSPITAL_COMMUNITY): Payer: Medicare PPO | Attending: Cardiology

## 2023-02-10 DIAGNOSIS — R0600 Dyspnea, unspecified: Secondary | ICD-10-CM | POA: Insufficient documentation

## 2023-02-10 LAB — ECHOCARDIOGRAM COMPLETE
Area-P 1/2: 3.77 cm2
Calc EF: 61.8 %
S' Lateral: 3 cm
Single Plane A2C EF: 58.8 %
Single Plane A4C EF: 62.4 %

## 2023-02-14 ENCOUNTER — Telehealth: Payer: Self-pay | Admitting: Cardiovascular Disease

## 2023-02-14 MED ORDER — VALSARTAN 40 MG PO TABS: 40.0000 mg | ORAL_TABLET | Freq: Every day | ORAL | 3 refills | Status: DC

## 2023-02-14 NOTE — Telephone Encounter (Signed)
Pt is returning Sarah's call regarding results. She would like it to be explained further for her. Please call back.

## 2023-02-14 NOTE — Telephone Encounter (Signed)
Returned call to patient who states that she had read up on Valsartan and was just concerned. Spoke at length with her about it being low-dose and we are not stating she has heart failure. We are trying to help decrease pre-load/after-load on her heart to support it with her LBBB. She understands to watch her BP. Will call if she has additional questions.

## 2023-02-22 ENCOUNTER — Ambulatory Visit: Payer: Medicare PPO | Admitting: Internal Medicine

## 2023-02-24 NOTE — Telephone Encounter (Signed)
Patient has questions regarding her medication.

## 2023-02-24 NOTE — Telephone Encounter (Signed)
Returned call to patient who states she went to pharmacy to pick up Valsartan today and spoke with pharmacist about the medication and is worried again about taking it since her BP is always normal. States her original appointment was moved out until May and she just wasn't comfortable with taking it and going that long before seeing Dr Acie Fredrickson. Moved her appointment up to 03/06/23 so concerns can be addressed. She states she will hold off on starting Valsartan until then.

## 2023-03-04 ENCOUNTER — Encounter: Payer: Self-pay | Admitting: Cardiovascular Disease

## 2023-03-04 NOTE — Progress Notes (Unsigned)
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 She has been having exertional intrascapular pain for the past several months. He typically hurts if she climbs several flights of stairs or walks up a hill.  There is no associated dyspnea or syncope.  I've seen her in the remote past. We performed a stress test results which cannot be found today but she reports that they were normal.   Feb. 12, 2024 Samantha [name] is a 77 y.o. female with a hx of chest pain  She was last seen 11 years ago    Has had recent CP Her mother died of an MI  She had CP while eating chicken .  Thought it may have been indigestion   She has been seen by Dr. Beckie Busing recently  Was found to have a LBBB .  Echo and cor CTA were ordered.   Coronary CT angiogram was performed.  This revealed very mildly dilated ascending thoracic aorta measuring 4.0 cm.  They recommended annual follow-up for this.  ( She is very concerned about this )   Advised her to avoid flouroquinolones due to possible worsening of aortic aneurisms .    Coronaries:  CAC  score is 0  RCA :   mild soft plaque LM : normal  LAD :  normal  LCx :   normal   Echo - scheduled    She walks regularly ,   only has DOE if she is walking up a hill or if she is pushing the pace .  Walks 2 miles several times a week . No CP with walking   Wt is 188 .   LDL was 102  in Nov. 2022  Will repeat her LDL and LP(A)    March 08, 2023 Samantha [name] is seen for follow up of her LBBB  Cor CTA 01/27/23  shows minimal CAD in the RCA.  CAC is 0.  Mild dilatation of the ascending aorta at 4.0 cm.   Plan  is for aortic CTA in 1 year   Echo:  normal LVEF of 55-60%.  Grade I Dd  Meds include valsartan 40 mg a day   LP(a) is 12 LDL 02/06/23 is 97      Past Medical History:  Diagnosis Date   Colonic polyp     Past Surgical History:  Procedure Laterality Date   CHOLECYSTECTOMY     TONSILLECTOMY      Current Medications: No outpatient medications have been marked as taking for the 03/08/23 encounter (Office Visit) with Mckaylie Vasey, Wonda Cheng, MD.     Allergies:   Patient has no known allergies.   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   Regular exercise -  Only ocass now   [place] of 1    etoh 1-2 per week   Daughter murdered in her house   Social Determinants of Health   Financial Resource Strain: [place]  (10/31/2022)   Overall Financial Resource Strain (CARDIA)    Difficulty of Paying Living Expenses: Not hard at all  Food Insecurity: No Food Insecurity (10/31/2022)   Hunger Vital Sign    Worried About Running Out of Food in the Last Year: Never true    [place] in the Last Year: Never true  Transportation Needs: No Transportation Needs (10/31/2022)   PRAPARE - Hydrologist (Medical): No    Lack of Transportation (Non-Medical): No  Physical Activity: Inactive (10/31/2022)   Exercise Vital Sign    Days of Exercise per Week:  0 days    Minutes of Exercise per Session: 0 min  Stress: No Stress Concern Present (10/31/2022)   [place]    Feeling of Stress : Not at all  Social Connections: Moderately Integrated (10/31/2022)   Social Connection and Isolation Panel [NHANES]    Frequency of Communication with Friends and Family: More than three times a week    Frequency of Social Gatherings with Friends and Family: More than three times a week    Attends Religious Services: More than 4 times per year    Active Member of [employer] or Organizations: Yes    Attends Music therapist: More than 4 times per year    Marital Status: Never married     Family History: The patient's family history includes Cancer in her sister; Heart attack in her mother; Hypertension in her mother; Other in her daughter.  ROS:   Please see the history of present illness.     All other systems reviewed and are negative.  EKGs/Labs/Other Studies Reviewed:    The following studies were reviewed today:   EKG:      Recent Labs: 01/23/2023: BUN 14; Creatinine, Ser 0.54; Potassium 4.7; Sodium 143  Recent Lipid Panel    Component Value Date/Time   CHOL 205 (H) 02/06/2023 1533   TRIG 194 (H) 02/06/2023 1533   HDL 75 02/06/2023 1533   CHOLHDL 2.7 02/06/2023 1533   CHOLHDL 3 11/08/2021 1157   VLDL 21.0 11/08/2021 1157   LDLCALC 97 02/06/2023 1533   LDLDIRECT 136.0 05/15/2018 1446     Risk Assessment/Calculations:        Physical Exam:    Physical Exam: There were no vitals taken for this visit.  No BP recorded.  {Refresh Note OR Click here to enter BP  :1}***    GEN:  Well nourished, well developed in no acute distress HEENT: Normal NECK: No JVD; No carotid bruits LYMPHATICS: No lymphadenopathy CARDIAC: RRR ***, no murmurs, rubs, gallops RESPIRATORY:  Clear to auscultation without rales, wheezing or rhonchi  ABDOMEN: Soft, non-tender,  non-distended MUSCULOSKELETAL:  No edema; No deformity  SKIN: Warm and dry NEUROLOGIC:  Alert  and oriented x 3   ASSESSMENT:    No diagnosis found.  PLAN:       LBBB  -coronary CT angiogram reveals only minor coronary artery irregularities.  No ischemic heart disease to explain her left bundle branch block.    2.  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     3.  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 ***  4.  Hyperlipidemia : LDL from November, 2022 was 102.  Will repeat today.  Will also draw LP(a).   Medication Adjustments/Labs and Tests Ordered: Current medicines are reviewed at length with the patient today.  Concerns regarding medicines are outlined above.  No orders of the defined types were placed in this encounter.  No orders of the defined types were placed in this encounter.   There are no Patient Instructions on file for this visit.   Signed, Mertie Moores, MD  03/04/2023 2:22 PM    [place]

## 2023-03-06 ENCOUNTER — Ambulatory Visit: Payer: Medicare PPO | Admitting: Cardiovascular Disease

## 2023-03-07 ENCOUNTER — Ambulatory Visit: Payer: Medicare PPO | Attending: Cardiovascular Disease

## 2023-03-07 ENCOUNTER — Ambulatory Visit: Payer: Medicare PPO | Admitting: Cardiovascular Disease

## 2023-03-07 DIAGNOSIS — Z79899 Other long term (current) drug therapy: Secondary | ICD-10-CM

## 2023-03-08 ENCOUNTER — Encounter: Payer: Self-pay | Admitting: Cardiovascular Disease

## 2023-03-08 ENCOUNTER — Ambulatory Visit: Payer: Medicare PPO | Attending: Cardiovascular Disease | Admitting: Cardiovascular Disease

## 2023-03-08 VITALS — BP 145/78 | HR 75 | Ht 61.5 in | Wt 187.0 lb

## 2023-03-08 DIAGNOSIS — Z79899 Other long term (current) drug therapy: Secondary | ICD-10-CM | POA: Diagnosis not present

## 2023-03-08 LAB — BASIC METABOLIC PANEL
BUN/Creatinine Ratio: 27 (ref 12–28)
BUN: 15 mg/dL (ref 8–27)
CO2: 23 mmol/L (ref 20–29)
Calcium: 9 mg/dL (ref 8.7–10.3)
Chloride: 103 mmol/L (ref 96–106)
Creatinine, Ser: 0.56 mg/dL — ABNORMAL LOW (ref 0.57–1.00)
Glucose: 87 mg/dL (ref 70–99)
Potassium: 4.4 mmol/L (ref 3.5–5.2)
Sodium: 140 mmol/L (ref 134–144)
eGFR: 95 mL/min/{1.73_m2} (ref >59)

## 2023-03-08 NOTE — Patient Instructions (Signed)
Medication Instructions:  PLEASE START Valsartan *If you need a refill on your cardiac medications before your next appointment, please call your pharmacy*   Lab Work: BMET in 2-3 WEEKS If you have labs (blood work) drawn today and your tests are completely normal, you will receive your results only by: [place] (if you have MyChart) OR A paper copy in the mail If you have any lab test that is abnormal or we need to change your treatment, we will call you to review the results.   Testing/Procedures: NONE   Follow-Up: At [facility], you and your health needs are our priority.  As part of our continuing mission to provide you with exceptional heart care, we have created designated Provider Care Teams.  These Care Teams include your primary Cardiologist (physician) and Advanced Practice Providers (APPs -  Physician Assistants and Nurse Practitioners) who all work together to provide you with the care you need, when you need it.  We recommend signing up for the patient portal called "MyChart".  Sign up information is provided on this After Visit Summary.  MyChart is used to connect with patients for Virtual Visits (Telemedicine).  Patients are able to view lab/test results, encounter notes, upcoming appointments, etc.  Non-urgent messages can be sent to your provider as well.   To learn more about what you can do with MyChart, go to [email or web address].    Your next appointment:   3 month(s)  Provider:   Mertie Moores, MD

## 2023-03-13 ENCOUNTER — Encounter: Payer: Self-pay | Admitting: Cardiovascular Disease

## 2023-03-14 ENCOUNTER — Encounter: Payer: Self-pay | Admitting: Cardiovascular Disease

## 2023-03-24 ENCOUNTER — Encounter: Payer: Self-pay | Admitting: Cardiovascular Disease

## 2023-03-29 ENCOUNTER — Ambulatory Visit: Payer: Medicare PPO

## 2023-04-13 ENCOUNTER — Ambulatory Visit: Payer: Medicare PPO | Admitting: Internal Medicine

## 2023-04-14 ENCOUNTER — Encounter: Payer: Self-pay | Admitting: Cardiovascular Disease

## 2023-04-14 DIAGNOSIS — M25551 Pain in right hip: Secondary | ICD-10-CM | POA: Diagnosis not present

## 2023-04-14 DIAGNOSIS — M1611 Unilateral primary osteoarthritis, right hip: Secondary | ICD-10-CM | POA: Diagnosis not present

## 2023-04-18 ENCOUNTER — Telehealth: Payer: Self-pay | Admitting: *Deleted

## 2023-04-18 NOTE — Telephone Encounter (Signed)
   Pre-operative Risk Assessment    Patient Name: Samantha [name]  DOB: 1946-02-19 MRN: [id number]      Request for Surgical Clearance    Procedure:   RT TOTAL HIP ARTHROPLASTY  Date of Surgery:  Clearance 07/17/23                                 Surgeon:  DR. Ollen Gross Surgeon's Group or Practice Name:  [facility] Phone number:  [phone] Fax number:  [phone]   Type of Clearance Requested:   - Medical    Type of Anesthesia:   CHOICE ANESTHESIA   Additional requests/questions:    Wilhemina Cash   04/18/2023, 3:25 PM

## 2023-04-19 ENCOUNTER — Encounter: Payer: Self-pay | Admitting: Internal Medicine

## 2023-04-19 NOTE — Telephone Encounter (Signed)
   Name: Samantha [name]  DOB: 19-Jun-1946  MRN: [id number]  Primary Cardiologist: Kristeen Miss, MD  Chart reviewed as part of pre-operative protocol coverage. The patient has an upcoming visit scheduled with Dr. Elease Hashimoto on 06/14/2023 at which time clearance can be addressed in case there are any issues that would impact surgical recommendations.  R total hip arthoplasty is not scheduled until 07/17/2023 as below. I added preop FYI to appointment note so that provider is aware to address at time of outpatient visit.  Per office protocol the cardiology provider should forward their finalized clearance decision and recommendations regarding antiplatelet therapy to the requesting party below.    I will route this message as FYI to requesting party and remove this message from the preop box as separate preop APP input not needed at this time.   Please call with any questions.  Joylene Grapes, NP  04/19/2023, 11:27 AM

## 2023-04-23 NOTE — Telephone Encounter (Signed)
SO no visit  with me that I see since fall 2022? Cardiology should clear her for CV  I can see her for med clearance this week

## 2023-04-25 ENCOUNTER — Encounter: Payer: Self-pay | Admitting: Cardiovascular Disease

## 2023-04-25 NOTE — Telephone Encounter (Signed)
Patient is returning call and scheduled for 05/08 for Robin Searing, NP.

## 2023-04-25 NOTE — Telephone Encounter (Signed)
I will update the requesting office the pt has sooner appt 05/03/23 for pre op clearance.

## 2023-04-25 NOTE — Telephone Encounter (Addendum)
CLEARANCE REQUEST HAS BEEN RE-FAXED TO OUR OFFICE TODAY WITH A NEW SURGERY DATE; SURGERY HAS BEEN MOVED UP TO 05/10/23. PT HAS APPT 06/14/23 WITH DR. Elease Hashimoto. PT WILL NEED TO MOVE HER APPT WITH CARDIOLOGIST SOONER.  I left message for the pt that she will need a sooner appt in the office. At the time I left my message there were no sooner appts with Dr. Elease Hashimoto. Pt can see APP sooner for pre op clearance. If pt chooses she can keep her appt with Dr. Elease Hashimoto, however she will need a sooner appt for pre op clearance.

## 2023-04-26 ENCOUNTER — Ambulatory Visit: Payer: Medicare PPO | Admitting: Internal Medicine

## 2023-04-26 ENCOUNTER — Encounter: Payer: Self-pay | Admitting: Internal Medicine

## 2023-04-26 VITALS — BP 124/74 | Temp 98.0°F | Ht 61.5 in | Wt 189.2 lb

## 2023-04-26 DIAGNOSIS — I7121 Aneurysm of the ascending aorta, without rupture: Secondary | ICD-10-CM

## 2023-04-26 DIAGNOSIS — M1611 Unilateral primary osteoarthritis, right hip: Secondary | ICD-10-CM

## 2023-04-26 DIAGNOSIS — Z01818 Encounter for other preprocedural examination: Secondary | ICD-10-CM

## 2023-04-26 DIAGNOSIS — I447 Left bundle-branch block, unspecified: Secondary | ICD-10-CM

## 2023-04-26 NOTE — Patient Instructions (Addendum)
SURGICAL WAITING ROOM VISITATION  Patients having surgery or a procedure may have no more than 2 support people in the waiting area - these visitors may rotate.    Children under the age of 79 must have an adult with them who is not the patient.  Due to an increase in RSV and influenza rates and associated hospitalizations, children ages 68 and under may not visit patients in [facility] hospitals.  If the patient needs to stay at the hospital during part of their recovery, the visitor guidelines for inpatient rooms apply. Pre-op nurse will coordinate an appropriate time for 1 support person to accompany patient in pre-op.  This support person may not rotate.    Please refer to the [facility] website for the visitor guidelines for Inpatients (after your surgery is over and you are in a regular room).       Your procedure is scheduled on: 05/10/23    Report to [facility] Main Entrance    Report to admitting at    0900AM   Call this number if you have problems the morning of surgery [phone]   Do not eat food :After Midnight.   After Midnight you may have the following liquids until ___  0830___ AM DAY OF SURGERY  Water Non-Citrus Juices (without pulp, NO RED-Apple, White grape, White cranberry) Black Coffee (NO MILK/CREAM OR CREAMERS, sugar ok)  Clear Tea (NO MILK/CREAM OR CREAMERS, sugar ok) regular and decaf                             Plain Jell-O (NO RED)                                           Fruit ices (not with fruit pulp, NO RED)                                     Popsicles (NO RED)                                                               Sports drinks like Gatorade (NO RED)                   The day of surgery:  Drink ONE (1) Pre-Surgery Clear Ensure or G2 at   0830AM   ( have completed by ) the morning of surgery. Drink in one sitting. Do not sip.  This drink was given to you during your hospital  pre-op appointment visit. Nothing else to  drink after completing the  Pre-Surgery Clear Ensure or G2.          If you have questions, please contact your surgeon's office.        Oral Hygiene is also important to reduce your risk of infection.                                    Remember - BRUSH YOUR TEETH THE MORNING OF  SURGERY WITH YOUR REGULAR TOOTHPASTE  DENTURES WILL BE REMOVED PRIOR TO SURGERY PLEASE DO NOT APPLY "Poly grip" OR ADHESIVES!!!   Do NOT smoke after Midnight   Take these medicines the morning of surgery with A SIP OF WATER:  none    DO NOT TAKE ANY ORAL DIABETIC MEDICATIONS DAY OF YOUR SURGERY  Bring CPAP mask and tubing day of surgery.                              You may not have any metal on your body including hair pins, jewelry, and body piercing             Do not wear make-up, lotions, powders, perfumes/cologne, or deodorant  Do not wear nail polish including gel and S&S, artificial/acrylic nails, or any other type of covering on natural nails including finger and toenails. If you have artificial nails, gel coating, etc. that needs to be removed by a nail salon please have this removed prior to surgery or surgery may need to be canceled/ delayed if the surgeon/ anesthesia feels like they are unable to be safely monitored.   Do not shave  48 hours prior to surgery.               Men may shave face and neck.   Do not bring valuables to the hospital. [facility] IS NOT             RESPONSIBLE   FOR VALUABLES.   Contacts, glasses, dentures or bridgework may not be worn into surgery.   Bring small overnight bag day of surgery.   DO NOT BRING YOUR HOME MEDICATIONS TO THE HOSPITAL. PHARMACY WILL DISPENSE MEDICATIONS LISTED ON YOUR MEDICATION LIST TO YOU DURING YOUR ADMISSION IN THE HOSPITAL!    Patients discharged on the day of surgery will not be allowed to drive home.  Someone NEEDS to stay with you for the first 24 hours after anesthesia.   Special Instructions: Bring a copy of your healthcare  power of attorney and living will documents the day of surgery if you haven't scanned them before.              Please read over the following fact sheets you were given: IF YOU HAVE QUESTIONS ABOUT YOUR PRE-OP INSTRUCTIONS PLEASE CALL [phone]   If you received a COVID test during your pre-op visit  it is requested that you wear a mask when out in public, stay away from anyone that may not be feeling well and notify your surgeon if you develop symptoms. If you test positive for Covid or have been in contact with anyone that has tested positive in the last 10 days please notify you surgeon.

## 2023-04-26 NOTE — Progress Notes (Signed)
Chief Complaint  Patient presents with   Medical Clearance    Pt is here a clearance for hip surgery on May 15. Has clearance appt with cardiology next week.     HPI: Samantha [name] 77 y.o. come in for pre op assessment  medical for  RTHA Dr Despina Hick on 5 15 24   See notes R hip pain for years and now debilitating  Hx of aortic dilatation and Lbbb seen by dr Elease Hashimoto has appt for cards clearance next week  Valsartan  for a week.  And stopped  because felt tired  but no cp   Tired all the time  No bleeding  new neuro sx  Plans  home care but lives alone and ortho reluctant to do rehanb care for risk  of infection  Was given idocin rx for pain not taken yet ROS: See pertinent positives and negatives per HPI.  Past Medical History:  Diagnosis Date   Colonic polyp     Family History  Problem Relation Age of Onset   Cancer Sister        colon - 5   Heart attack Mother        101   Hypertension Mother    Other Daughter        murdered    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   Regular exercise -  Only ocass now   Hh of 1    etoh 1-2 per week   Daughter murdered in her house   Social Determinants of Health   Financial Resource Strain: Low Risk  (10/31/2022)   Overall Financial Resource Strain (CARDIA)    Difficulty of Paying Living Expenses: Not hard at  all  Food Insecurity: No Food Insecurity (10/31/2022)   Hunger Vital Sign    Worried About Running Out of Food in the Last Year: Never true    Ran Out of Food in the Last Year: Never true  Transportation Needs: No Transportation Needs (10/31/2022)   PRAPARE - Administrator, Civil Service (Medical): No    Lack of Transportation (Non-Medical): No  Physical Activity: Inactive (10/31/2022)   Exercise Vital Sign    Days of Exercise per Week: 0 days    Minutes of Exercise per Session: 0 min  Stress: No Stress Concern Present (10/31/2022)   [employer] of Occupational Health - Occupational Stress Questionnaire    Feeling of Stress : Not at all  Social Connections: Moderately Integrated (10/31/2022)   Social Connection and Isolation Panel [NHANES]    Frequency of Communication with Friends and Family: More than three times a week    Frequency of Social Gatherings with Friends and  Family: More than three times a week    Attends Religious Services: More than 4 times per year    Active Member of Clubs or Organizations: Yes    Attends Engineer, structural: More than 4 times per year    Marital Status: Never married    Outpatient Medications Prior to Visit  Medication Sig Dispense Refill   acetaminophen (TYLENOL) 500 MG tablet Take 500 mg by mouth every 6 (six) hours as needed. 2 tablets     Naproxen Sodium (ALEVE PO) Take by mouth.     indomethacin (INDOCIN) 25 MG capsule Take 25 mg by mouth 2 (two) times daily with a meal. (Patient not taking: Reported on 04/26/2023)     valsartan (DIOVAN) 40 MG tablet Take 1 tablet (40 mg total) by mouth daily. (Patient not taking: Reported on 03/08/2023) 90 tablet 3   No facility-administered medications prior to visit.     EXAM:  BP 124/74   Temp 98 F (36.7 C) (Oral)   Ht 5' 1.5" (1.562 m)   Wt 189 lb 3.2 oz (85.8 kg)   SpO2 96%   BMI 35.17 kg/m   Body mass index is 35.17 kg/m.  GENERAL: vitals reviewed and listed above,  alert, oriented, appears well hydrated and in no acute distress HEENT: atraumatic, conjunctiva  clear, no obvious abnormalities on inspection of external nose and ears  NECK: no obvious masses on inspection palpation  LUNGS: clear to auscultation bilaterally, no wheezes, rales or rhonchi, good air movement CV: HRRR, no clubbing cyanosis slight edema  nl cap refill  MS: moves all extremities without noticeable focal  abnormality antalgic gait   ambulatory ( often uses cane)  Skin no bruising or petechiae PSYCH: pleasant and cooperative, no obvious depression or anxiety Lab Results  Component Value Date   WBC 8.5 11/08/2021   HGB 13.2 11/08/2021   HCT 39.6 11/08/2021   PLT 278.0 11/08/2021   GLUCOSE 87 03/07/2023   CHOL 205 (H) 02/06/2023   TRIG 194 (H) 02/06/2023   HDL 75 02/06/2023   LDLDIRECT 136.0 05/15/2018   LDLCALC 97 02/06/2023   ALT 20 11/08/2021   AST 24 11/08/2021   NA 140 03/07/2023   K 4.4 03/07/2023   CL 103 03/07/2023   CREATININE 0.56 (L) 03/07/2023   BUN 15 03/07/2023   CO2 23 03/07/2023   TSH 1.22 11/08/2021   HGBA1C 4.9 03/12/2013   BP Readings from Last 3 Encounters:  04/26/23 124/74  03/08/23 (!) 145/78  02/06/23 132/78    ASSESSMENT AND PLAN:  Discussed the following assessment and plan:  Arthritis of right hip  Pre-operative clearance  Aneurysm of ascending aorta without rupture (HCC)  LBBB (left bundle branch block) Record review and exam  Medically stable and optimized  Pain contributing to fatigue  No dx of osa Would not take indocin at this time pre operatively Cardiology to assess for operative  risk  Expectant management.  -Patient advised to return or notify health care team  if  new concerns arise.  Patient Instructions  Good to see you today  Medically stable to  have the hip  surgery.. Will send in form It is possible that  pain is also adding to the fatigue .    Neta Mends. Luisenrique Conran M.D.

## 2023-04-26 NOTE — Patient Instructions (Signed)
Good to see you today  Medically stable to  have the hip  surgery.. Will send in form It is possible that  pain is also adding to the fatigue .

## 2023-04-27 ENCOUNTER — Telehealth: Payer: Self-pay

## 2023-04-27 NOTE — Telephone Encounter (Signed)
PREOPERATIVE RISK ASSESSMENT  form was faxed to [facility] fax# [phone] along with office notes. Received confirmation.

## 2023-04-28 ENCOUNTER — Encounter (HOSPITAL_COMMUNITY): Payer: Self-pay

## 2023-04-28 ENCOUNTER — Other Ambulatory Visit: Payer: Self-pay

## 2023-04-28 ENCOUNTER — Encounter (HOSPITAL_COMMUNITY)
Admission: RE | Admit: 2023-04-28 | Discharge: 2023-04-28 | Disposition: A | Discharge status: Home / Self Care | Payer: Medicare PPO | Source: Ambulatory Visit | Attending: Orthopedic Surgery | Admitting: Orthopedic Surgery

## 2023-04-28 VITALS — BP 133/77 | HR 77 | Temp 98.4°F | Resp 16 | Ht 61.5 in | Wt 175.0 lb

## 2023-04-28 DIAGNOSIS — I2510 Atherosclerotic heart disease of native coronary artery without angina pectoris: Secondary | ICD-10-CM | POA: Diagnosis not present

## 2023-04-28 DIAGNOSIS — M1611 Unilateral primary osteoarthritis, right hip: Secondary | ICD-10-CM | POA: Insufficient documentation

## 2023-04-28 DIAGNOSIS — Z01818 Encounter for other preprocedural examination: Secondary | ICD-10-CM

## 2023-04-28 DIAGNOSIS — Z01812 Encounter for preprocedural laboratory examination: Secondary | ICD-10-CM | POA: Diagnosis not present

## 2023-04-28 DIAGNOSIS — I447 Left bundle-branch block, unspecified: Secondary | ICD-10-CM | POA: Insufficient documentation

## 2023-04-28 HISTORY — DX: Unspecified osteoarthritis, unspecified site: M19.90

## 2023-04-28 LAB — TYPE AND SCREEN
ABO/RH(D): O POS
Antibody Screen: NEGATIVE

## 2023-04-28 LAB — CBC
HCT: 37.8 % (ref 36.0–46.0)
Hemoglobin: 12.1 g/dL (ref 12.0–15.0)
MCH: 31.2 pg (ref 26.0–34.0)
MCHC: 32 g/dL (ref 30.0–36.0)
MCV: 97.4 fL (ref 80.0–100.0)
Platelets: 266 10*3/uL (ref 150–400)
RBC: 3.88 MIL/uL (ref 3.87–5.11)
RDW: 12 % (ref 11.5–15.5)
WBC: 10.6 10*3/uL — ABNORMAL HIGH (ref 4.0–10.5)
nRBC: 0 % (ref 0.0–0.2)

## 2023-04-28 LAB — BASIC METABOLIC PANEL
Anion gap: 8 (ref 5–15)
BUN: 16 mg/dL (ref 8–23)
CO2: 23 mmol/L (ref 22–32)
Calcium: 8.7 mg/dL — ABNORMAL LOW (ref 8.9–10.3)
Chloride: 106 mmol/L (ref 98–111)
Creatinine, Ser: 0.5 mg/dL (ref 0.44–1.00)
GFR, Estimated: >60 mL/min (ref >60)
Glucose, Bld: 92 mg/dL (ref 70–99)
Potassium: 3.9 mmol/L (ref 3.5–5.1)
Sodium: 137 mmol/L (ref 135–145)

## 2023-04-28 LAB — SURGICAL PCR SCREEN
MRSA, PCR: NEGATIVE
Staphylococcus aureus: NEGATIVE

## 2023-04-28 NOTE — Progress Notes (Addendum)
Anesthesia Review:  PCP: DR Panosh Clearance on chart dated 04/26/23 LOV 04/26/23 on chart  Cardiologist : DR Kristeen Miss LOV 03/08/23  Chest x-ray : EKG : 01/23/23  Echo : Stress test: Cardiac Cath :  Activity level: can do a flight of stairs without difficutly  Sleep Study/ CPAP : none  Fasting Blood Sugar :      / Checks Blood Sugar -- times a day:   Blood Thinner/ Instructions /Last Dose: ASA / Instructions/ Last Dose :

## 2023-05-01 NOTE — Progress Notes (Unsigned)
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  Samantha [name] was initially seen by Dr. Elease Hashimoto on 01/2012 for complaint of exertional chest pain.  She reported exertional intra scapular pain for several months.  She underwent ETT stress test however results cannot be found from 2013.  She was seen by Dr. Carolan Clines on 01/23/2023 for complaint of shortness of breath.  She endorsed shortness of breath with exertion only.  EKG was completed and was found to have a LBBB.  She underwent a coronary CT that showed calcium score of 0 and thoracic aorta measuring 40 mm.  2D echo was also completed that showed normal EF with no valvular abnormalities and grade 1 DD.  Since last being seen in the office patient reports***.  Patient denies chest pain, palpitations, dyspnea, PND, orthopnea, nausea, vomiting, dizziness, syncope, edema, weight gain, or early satiety.     ***Notes: -Scheduled for total hip arthroplasty 05/10/2023 Home Medications    Current Outpatient Medications  Medication Sig Dispense Refill   acetaminophen (TYLENOL) 500 MG tablet Take 500 mg by mouth every 6 (six) hours as needed. 2 tablets     Naproxen Sodium (ALEVE PO) Take by mouth.     No current facility-administered medications for this visit.     Review of Systems  Please see the history of present illness.    (+)*** (+)***  All other systems reviewed and  are otherwise negative except as noted above.  Physical Exam    Wt Readings from Last 3 Encounters:  04/28/23 175 lb (79.4 kg)  04/26/23 189 lb 3.2 oz (85.8 kg)  03/08/23 187 lb (84.8 kg)   WU:JWJXB were no vitals filed for this visit.,There is no height or weight on file to calculate BMI.  Constitutional:      Appearance: Healthy appearance. Not in distress.  Neck:     Vascular: JVD normal.  Pulmonary:     Effort: Pulmonary effort is normal.     Breath sounds: No wheezing. No rales. Diminished in the bases Cardiovascular:     Normal rate. Regular rhythm. Normal S1. Normal S2.      Murmurs: There is no murmur.  Edema:    Peripheral edema absent.  Abdominal:     Palpations: Abdomen is soft non tender. There is no hepatomegaly.  Skin:    General: Skin is warm and dry.  Neurological:     General: No focal deficit present.     Mental Status: Alert and oriented to person, place and time.     Cranial Nerves: Cranial nerves are intact.  EKG/LABS/ Recent Cardiac Studies    ECG personally reviewed by me today - ***  Cardiac Studies & Procedures       ECHOCARDIOGRAM  ECHOCARDIOGRAM COMPLETE 02/10/2023  Narrative ECHOCARDIOGRAM REPORT    Patient Name:   Samantha [name] Date of Exam: 02/10/2023 Medical Rec #:  [id number]     Height:       62.0 in Accession #:    [id number]    Weight:       188.2 lb Date of Birth:  1946-08-03    BSA:          1.863 m Patient Age:    76 years      BP:           132/78 mmHg Patient Gender: F             HR:           69 bpm. Exam Location:  [address]  Procedure: 2D Echo, Cardiac Doppler and Color Doppler  Indications:    Shortness of breath [786.05.ICD-9-CM]  History:        Patient has no prior history of Echocardiogram examinations. Signs/Symptoms:Dyspnea and Shortness of Breath.  Sonographer:    Eulah Pont RDCS Referring Phys: [phone] MARY E BRANCH  IMPRESSIONS   1. Left ventricular ejection fraction, by estimation, is 55 to  60%. The left ventricle has normal function. The left ventricle has no regional wall motion abnormalities. Left ventricular diastolic parameters are consistent with Grade I diastolic dysfunction (impaired relaxation). 2. Right ventricular systolic function is normal. The right ventricular size is normal. There is normal pulmonary artery systolic pressure. 3. Left atrial size was mildly dilated. 4. The mitral valve is normal in structure. No evidence of mitral valve regurgitation. No evidence of mitral stenosis. 5. The aortic valve is tricuspid. There is mild calcification of the aortic valve. Aortic valve regurgitation is mild. No aortic stenosis is present. 6. The inferior vena cava is normal in size with greater than 50% respiratory variability, suggesting right atrial pressure of 3 mmHg.  FINDINGS Left Ventricle: Left ventricular ejection fraction, by estimation, is 55 to 60%. The left ventricle has normal function. The left ventricle has no regional wall motion abnormalities. The left ventricular internal cavity size was normal in size. There is no left ventricular hypertrophy. Left ventricular diastolic parameters are consistent with Grade I diastolic dysfunction (impaired relaxation).  Right Ventricle: The right ventricular size is normal. No increase in right ventricular wall thickness. Right ventricular systolic function is normal. There is normal pulmonary artery systolic pressure. The tricuspid regurgitant velocity is 2.11 m/s, and with an assumed right atrial pressure of 3 mmHg, the estimated right ventricular systolic pressure is 20.8 mmHg.  Left Atrium: Left atrial size was mildly dilated.  Right Atrium: Right atrial size was normal in size.  Pericardium: There is no evidence of pericardial effusion.  Mitral Valve: The mitral valve is normal in structure. No evidence of mitral valve regurgitation. No evidence of mitral valve stenosis.  Tricuspid Valve: The tricuspid valve is  normal in structure. Tricuspid valve regurgitation is trivial. No evidence of tricuspid stenosis.  Aortic Valve: The aortic valve is tricuspid. There is mild calcification of the aortic valve. Aortic valve regurgitation is mild. No aortic stenosis is present.  Pulmonic Valve: The pulmonic valve was normal in structure. Pulmonic valve regurgitation is trivial. No evidence of pulmonic stenosis.  Aorta: The aortic root is normal in size and structure.  Venous: The inferior vena cava is normal in size with greater than 50% respiratory variability, suggesting right atrial pressure of 3 mmHg.  IAS/Shunts: No atrial level shunt detected by color flow Doppler.   LEFT VENTRICLE PLAX 2D LVIDd:         3.80  cm     Diastology LVIDs:         3.00 cm     LV e' medial:    8.76 cm/s LV PW:         0.90 cm     LV E/e' medial:  8.0 LV IVS:        0.90 cm     LV e' lateral:   8.15 cm/s LVOT diam:     1.80 cm     LV E/e' lateral: 8.6 LV SV:         69 LV SV Index:   37 LVOT Area:     2.54 cm  LV Volumes (MOD) LV vol d, MOD A2C: 49.5 ml LV vol d, MOD A4C: 90.1 ml LV vol s, MOD A2C: 20.4 ml LV vol s, MOD A4C: 33.9 ml LV SV MOD A2C:     29.1 ml LV SV MOD A4C:     90.1 ml LV SV MOD BP:      46.9 ml  RIGHT VENTRICLE RV S prime:     13.40 cm/s TAPSE (M-mode): 1.9 cm  LEFT ATRIUM             Index        RIGHT ATRIUM           Index LA diam:        2.80 cm 1.50 cm/m   RA Area:     10.50 cm LA Vol (A2C):   40.6 ml 21.79 ml/m  RA Volume:   18.70 ml  10.04 ml/m LA Vol (A4C):   50.2 ml 26.95 ml/m LA Biplane Vol: 47.6 ml 25.55 ml/m AORTIC VALVE LVOT Vmax:   133.00 cm/s LVOT Vmean:  90.300 cm/s LVOT VTI:    0.272 m  AORTA Ao Root diam: 3.20 cm  MITRAL VALVE                TRICUSPID VALVE MV Area (PHT): 3.77 cm     TR Peak grad:   17.8 mmHg MV Decel Time: 201 msec     TR Vmax:        211.00 cm/s MV E velocity: 70.40 cm/s MV A velocity: 120.00 cm/s  SHUNTS MV E/A ratio:  0.59          Systemic VTI:  0.27 m Systemic Diam: 1.80 cm  Arvilla Meres MD Electronically signed by Arvilla Meres MD Signature Date/Time: 02/10/2023/2:27:57 PM    Final     CT SCANS  CT CORONARY MORPH W/CTA COR W/SCORE 01/27/2023  Addendum 01/27/2023  4:39 PM ADDENDUM REPORT: 01/27/2023 16:37  EXAM: OVER-READ INTERPRETATION  CT CHEST  The following report is an over-read performed by radiologist Dr. Karle Barr [facility] on 01/27/2023. This over-read does not include interpretation of cardiac or coronary anatomy or pathology. The coronary CTA interpretation by the cardiologist is attached.  COMPARISON:  05/30/2008  FINDINGS: Aneurysmal dilatation ascending thoracic aorta 4.0 cm transverse image 1. No aortic dissection. No pericardial effusion. Central pulmonary arteries patent. Esophagus unremarkable. Visualized upper abdomen normal appearance. No adenopathy. Lungs clear. No osseous abnormalities.  IMPRESSION: Aneurysmal dilatation ascending thoracic aorta, 4.0 cm; Recommend annual imaging followup by CTA or MRA. This recommendation follows 2010 ACCF/AHA/AATS/ACR/ASA/SCA/SCAI/SIR/STS/SVM Guidelines for the Diagnosis and Management of Patients with Thoracic Aortic Disease. Circulation. 2010; 121: G956-O130. Aortic aneurysm NOS (ICD10-I71.9) .  Aortic aneurysm NOS (ICD10-I71.9).   Electronically Signed By: Ulyses Southward M.D. On: 01/27/2023 16:37  Narrative CLINICAL DATA:  43F with  shortness of breath and LBBB.  EXAM: Cardiac/Coronary  CT  TECHNIQUE: The patient was scanned on a [employer].  FINDINGS: A 120 kV prospective scan was triggered in the descending thoracic aorta at 111 HU's. Axial non-contrast 3 mm slices were carried out through the heart. The data set was analyzed on a dedicated work station and scored using the Agatson method. Gantry rotation speed was 250 msecs and collimation was .6 mm. No beta blockade and 0.8 mg of sl NTG  was given. The 3D data set was reconstructed in 5% intervals of the 67-82 % of the R-R cycle. Diastolic phases were analyzed on a dedicated work station using MPR, MIP and VRT modes. The patient received 80 cc of contrast.  Aorta: Normal size.  No calcifications.  No dissection.  Aortic Valve:  Trileaflet.  No calcifications.  Coronary Arteries:  Normal coronary origin.  Right dominance.  RCA is a large dominant artery that gives rise to PDA and PLVB. There is minimal (<25%) soft plaque proximally.  Left main is a large artery that gives rise to LAD, RI, and LCX arteries.  LAD is a large vessel that has no plaque. There are two small diagonal vessels without plaque.  LCX is a non-dominant artery that gives rise to one large OM1 branch. There is no plaque.  RI has no plaque.  Coronary Calcium Score: 0  Other findings:  Normal pulmonary vein drainage into the left atrium.  Normal let atrial appendage without a thrombus.  Normal size of the pulmonary artery.  IMPRESSION: 1. Coronary calcium score of 0. This was 1st percentile for age-, race-, and sex-matched controls.  2. Normal coronary origin with right dominance.  3. There is minimal (<25%) soft plaque in the proximal RCA. CAD-RADS 1.  Chilton Si, MD  Electronically Signed: By: Chilton Si M.D. On: 01/26/2023 17:18          Risk Assessment/Calculations:   {Does this patient have ATRIAL FIBRILLATION?:[phone]}        Lab Results  Component Value Date   WBC 10.6 (H) 04/28/2023   HGB 12.1 04/28/2023   HCT 37.8 04/28/2023   MCV 97.4 04/28/2023   PLT 266 04/28/2023   Lab Results  Component Value Date   CREATININE 0.50 04/28/2023   BUN 16 04/28/2023   NA 137 04/28/2023   K 3.9 04/28/2023   CL 106 04/28/2023   CO2 23 04/28/2023   Lab Results  Component Value Date   ALT 20 11/08/2021   AST 24 11/08/2021   ALKPHOS 88 11/08/2021   BILITOT 1.4 (H) 11/08/2021   Lab Results   Component Value Date   CHOL 205 (H) 02/06/2023   HDL 75 02/06/2023   LDLCALC 97 02/06/2023   LDLDIRECT 136.0 05/15/2018   TRIG 194 (H) 02/06/2023   CHOLHDL 2.7 02/06/2023    Lab Results  Component Value Date   HGBA1C 4.9 03/12/2013     Assessment & Plan    1.  Preoperative clearance  -Patient's RCRI score is 0.9%   2.  Nonobstructive CAD  3.  LBBB: -Stable with no current symptoms***  4.  Aortic dilation: -Cardiac CT completed showing aortic dilation of 40 mm. -Patient should avoid fluoroquinolones and monitor blood pressure.      Disposition: Follow-up with Kristeen Miss, MD or APP in *** months {Are you ordering a CV Procedure (e.g. stress test, cath, DCCV, TEE, etc)?   Press F2        :[id number]}  Medication Adjustments/Labs and Tests Ordered: Current medicines are reviewed at length with the patient today.  Concerns regarding medicines are outlined above.   Signed, Napoleon Form, Leodis Rains, NP 05/01/2023, 4:14 PM [facility]

## 2023-05-02 NOTE — H&P (Cosign Needed Addendum)
TOTAL HIP ADMISSION H&P  Patient is admitted for right total hip arthroplasty.  Subjective:  Chief Complaint: Right hip pain  HPI: Samantha [name], 77 y.o. female, has a history of pain and functional disability in the right hip due to arthritis and patient has failed non-surgical conservative treatments for greater than 12 weeks to include NSAID's and/or analgesics, corticosteriod injections, and activity modification. Onset of symptoms was gradual, starting  several  years ago with gradually worsening course since that time. The patient noted no past surgery on the right hip. Patient currently rates pain in the right hip at 8 out of 10 with activity. Patient has night pain, worsening of pain with activity and weight bearing, and trendelenberg gait. Patient has evidence of  advanced bone-on-bone arthritis in the right hip. There is essentially no joint space left, and large subchondral cysts and osteophytes are present  by imaging studies. This condition presents safety issues increasing the risk of falls. There is no current active infection.  Patient Active Problem List   Diagnosis Date Noted   Knee swelling 09/08/2014   Knee strain 09/08/2014   Edema of right lower extremity 09/08/2014   Estrogen deficiency 03/17/2014   Visit for preventive health examination 03/17/2014   Abdominal pain, chronic, generalized 10/10/2012   Obesity 02/11/2012   Routine gynecological examination 02/11/2012   Back pain, thoracic 01/20/2012   COLONIC POLYPS, HX OF 02/07/2010   BENIGN NEOPLASM OF COLON 02/03/2010   VITAMIN D DEFICIENCY 02/03/2010   Hyperlipidemia 02/03/2010   SLEEP DISORDER 02/03/2010    Past Medical History:  Diagnosis Date   Arthritis    Colonic polyp     Past Surgical History:  Procedure Laterality Date   CHOLECYSTECTOMY     TONSILLECTOMY      Prior to Admission medications   Medication Sig Start Date End Date Taking? Authorizing Provider  acetaminophen (TYLENOL) 500 MG tablet  Take 500 mg by mouth every 6 (six) hours as needed. 2 tablets    [provider]  Naproxen Sodium (ALEVE PO) Take by mouth.    [provider]    No Known Allergies  Social History   Socioeconomic History   Marital status: Divorced    Spouse name: Not on file   Number of children: Not on file   Years of education: Not on file   Highest education level: Not on file  Occupational History   Not on file  Tobacco Use   Smoking status: Never   Smokeless tobacco: Never  Vaping Use   Vaping Use: Never used  Substance and Sexual Activity   Alcohol use: Yes    Alcohol/week: 10.0 standard drinks of alcohol    Types: 10 Glasses of wine per week    Comment: rae   Drug use: No   Sexual activity: Not on file  Other Topics Concern   Not on file  Social History Narrative   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   Regular exercise -  Only ocass now   Hh of 1    etoh 1-2 per week   Daughter murdered in her house   Social Determinants of Health   Financial Resource Strain: Low Risk  (10/31/2022)   Overall Financial Resource Strain (CARDIA)    Difficulty of Paying Living Expenses: Not hard at all  Food Insecurity: No Food Insecurity (10/31/2022)   Hunger Vital Sign    Worried About Radiation protection practitioner of Food  in the Last Year: Never true    Ran Out of Food in the Last Year: Never true  Transportation Needs: No Transportation Needs (10/31/2022)   PRAPARE - Administrator, Civil Service (Medical): No    Lack of Transportation (Non-Medical): No  Physical Activity: Inactive (10/31/2022)   Exercise Vital Sign    Days of Exercise per Week: 0 days    Minutes of Exercise per Session: 0 min  Stress: No Stress Concern Present (10/31/2022)   [employer] of Occupational Health - Occupational Stress Questionnaire    Feeling of Stress : Not at all  Social Connections: Moderately Integrated (10/31/2022)   Social Connection and Isolation  Panel [NHANES]    Frequency of Communication with Friends and Family: More than three times a week    Frequency of Social Gatherings with Friends and Family: More than three times a week    Attends Religious Services: More than 4 times per year    Active Member of [employer] or Organizations: Yes    Attends Engineer, structural: More than 4 times per year    Marital Status: Never married  Intimate Partner Violence: Not At Risk (10/31/2022)   Humiliation, Afraid, Rape, and Kick questionnaire    Fear of Current or Ex-Partner: No    Emotionally Abused: No    Physically Abused: No    Sexually Abused: No    Tobacco Use: Low Risk  (04/28/2023)   Patient History    Smoking Tobacco Use: Never    Smokeless Tobacco Use: Never    Passive Exposure: Not on file   Social History   Substance and Sexual Activity  Alcohol Use Yes   Alcohol/week: 10.0 standard drinks of alcohol   Types: 10 Glasses of wine per week   Comment: rae    Family History  Problem Relation Age of Onset   Cancer Sister        colon - 50   Heart attack Mother        26   Hypertension Mother    Other Daughter        murdered    Review of Systems  Constitutional:  Negative for chills and fever.  HENT: Negative.    Eyes: Negative.   Respiratory:  Negative for cough and shortness of breath.   Cardiovascular:  Negative for chest pain and palpitations.  Gastrointestinal:  Negative for abdominal pain, nausea and vomiting.  Genitourinary:  Negative for dysuria, frequency and urgency.  Musculoskeletal:  Positive for joint pain.  Skin:  Negative for rash.   Objective:  Physical Exam: Well nourished and well developed.  General: Alert and oriented x3, cooperative and pleasant, no acute distress.  Head: normocephalic, atraumatic, neck supple.  Eyes: EOMI. Abdomen: non-tender to palpation and soft, normoactive bowel sounds. Musculoskeletal:  - Right hip Flexion 90, no internal or external rotation, about 10  degrees of abduction.  Calves soft and nontender. Motor function intact in LE. Strength 5/5 LE bilaterally. Neuro: Distal pulses 2+. Sensation to light touch intact in LE.  Vital signs in last 24 hours:    Imaging Review Plain radiographs demonstrate severe degenerative joint disease of the right hip. The bone quality appears to be adequate for age and reported activity level.  Assessment/Plan:  End stage arthritis, right hip  The patient history, physical examination, clinical judgement of the provider and imaging studies are consistent with end stage degenerative joint disease of the right hip and total hip arthroplasty is deemed medically  necessary. The treatment options including medical management, injection therapy, arthroscopy and arthroplasty were discussed at length. The risks and benefits of total hip arthroplasty were presented and reviewed. The risks due to aseptic loosening, infection, stiffness, dislocation/subluxation, thromboembolic complications and other imponderables were discussed. The patient acknowledged the explanation, agreed to proceed with the plan and consent was signed. Patient is being admitted for inpatient treatment for surgery, pain control, PT, OT, prophylactic antibiotics, VTE prophylaxis, progressive ambulation and ADLs and discharge planning.The patient is planning to be discharged  home .  Therapy Plans: HEP Disposition: Home with Daughter or Hired Caregivers Planned DVT Prophylaxis: Aspirin 81 mg BID DME Needed: None PCP: Berniece Andreas, MD (clearance received) Cardiologist: Laqueta Carina, MD (clearance in EPIC note 05/08) TXA: IV Allergies: NKDA Anesthesia Concerns: None BMI: 35.7 Last HgbA1c: not diabetic  Ph[facility]: [facility]  - Patient was instructed on what medications to stop prior to surgery. - Follow-up visit in 2 weeks with Dr. Lequita Halt - Begin physical therapy following surgery - Pre-operative lab work as pre-surgical testing -  Prescriptions will be provided in hospital at time of discharge  R. Arcola Jansky, PA-C Orthopedic Surgery [facility] [place]

## 2023-05-03 ENCOUNTER — Ambulatory Visit: Payer: Medicare PPO | Attending: Nurse Practitioner | Admitting: Nurse Practitioner

## 2023-05-03 ENCOUNTER — Encounter: Payer: Self-pay | Admitting: Nurse Practitioner

## 2023-05-03 VITALS — BP 118/82 | HR 79 | Ht 61.5 in | Wt 188.2 lb

## 2023-05-03 DIAGNOSIS — Z01810 Encounter for preprocedural cardiovascular examination: Secondary | ICD-10-CM | POA: Diagnosis not present

## 2023-05-03 DIAGNOSIS — I77810 Thoracic aortic ectasia: Secondary | ICD-10-CM | POA: Diagnosis not present

## 2023-05-03 DIAGNOSIS — I2510 Atherosclerotic heart disease of native coronary artery without angina pectoris: Secondary | ICD-10-CM

## 2023-05-03 DIAGNOSIS — I447 Left bundle-branch block, unspecified: Secondary | ICD-10-CM | POA: Diagnosis not present

## 2023-05-03 NOTE — Progress Notes (Signed)
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 Ollen Gross.  Patient last seen by cardiology 05/03/2023.  Per office visit note, "The patient affirms she has been doing well without any new cardiac symptoms. They are able to achieve 5 METS without cardiac limitations. Therefore, based on ACC/AHA guidelines, the patient would be at acceptable risk for the planned procedure without further cardiovascular testing. The patient was advised that if she develops new symptoms prior to surgery to contact our office to arrange for a follow-up visit, and she verbalized understanding."  Clearance from PCP on chart which states patient is optimized from medical standpoint.  Anticipate pt can proceed with planned procedure barring acute status change.   VS: BP 133/77   Pulse 77   Temp 36.9 C (Oral)   Resp 16   Ht 5' 1.5" (1.562 m)   Wt 79.4 kg   SpO2 98%   BMI 32.53 kg/m   PROVIDERS: Panosh, Neta Mends, MD is PCP  Primary Cardiologist:  Kristeen Miss, MD  LABS: Labs reviewed: Acceptable for surgery. (all labs ordered are listed, but only abnormal results are displayed)  Labs Reviewed  CBC - Abnormal; Notable for the following components:      Result Value   WBC 10.6 (*)    All other components within normal limits  BASIC METABOLIC PANEL - Abnormal; Notable for the following components:   Calcium 8.7 (*)    All other components within normal limits  SURGICAL PCR SCREEN  TYPE AND SCREEN     IMAGES:   EKG:   CV: CT coronary 01/26/2023 IMPRESSION: Aneurysmal dilatation ascending thoracic  aorta, 4.0 cm; Recommend annual imaging followup by CTA or MRA. This recommendation follows 2010 ACCF/AHA/AATS/ACR/ASA/SCA/SCAI/SIR/STS/SVM Guidelines for the Diagnosis and Management of Patients with Thoracic Aortic Disease. Circulation. 2010; 121: G956-O130. Aortic aneurysm NOS (ICD10-I71.9)  Echo 03/11/2023 1. Left ventricular ejection fraction, by estimation, is 55 to 60%. The  left ventricle has normal function. The left ventricle has no regional  wall motion abnormalities. Left ventricular diastolic parameters are  consistent with Grade I diastolic  dysfunction (impaired relaxation).   2. Right ventricular systolic function is normal. The right ventricular  size is normal. There is normal pulmonary artery systolic pressure.   3. Left atrial size was mildly dilated.   4. The mitral valve is normal in structure. No evidence of mitral valve  regurgitation. No evidence of mitral stenosis.   5. The aortic valve is tricuspid. There is mild calcification of the  aortic valve. Aortic valve regurgitation is mild. No aortic stenosis is  present.   6. The inferior vena cava is normal in size with greater than 50%  respiratory variability, suggesting right atrial pressure of 3 mmHg.  Past Medical History:  Diagnosis Date   Arthritis    Colonic polyp     Past Surgical History:  Procedure Laterality Date   CHOLECYSTECTOMY     TONSILLECTOMY      MEDICATIONS:  acetaminophen (TYLENOL) 500 MG tablet   No current facility-administered medications for this encounter.    Jodell Cipro Ward, PA-C [facility]  Pre-Surgical Testing [phone]

## 2023-05-03 NOTE — Patient Instructions (Addendum)
Medication Instructions:  Your physician recommends that you continue on your current medications as directed. Please refer to the Current Medication list given to you today. *If you need a refill on your cardiac medications before your next appointment, please call your pharmacy*   Lab Work: None ordered   Testing/Procedures: None ordered   Follow-Up: At [facility], you and your health needs are our priority.  As part of our continuing mission to provide you with exceptional heart care, we have created designated Provider Care Teams.  These Care Teams include your primary Cardiologist (physician) and Advanced Practice Providers (APPs -  Physician Assistants and Nurse Practitioners) who all work together to provide you with the care you need, when you need it.  We recommend signing up for the patient portal called "MyChart".  Sign up information is provided on this After Visit Summary.  MyChart is used to connect with patients for Virtual Visits (Telemedicine).  Patients are able to view lab/test results, encounter notes, upcoming appointments, etc.  Non-urgent messages can be sent to your provider as well.   To learn more about what you can do with MyChart, go to [email or web address].    Your next appointment:   FOLLOW UP AS SCHEDULED  Provider:   Kristeen Miss, MD     Other Instructions YOU ARE CLEARED FOR YOUR PROCEDURE

## 2023-05-03 NOTE — Anesthesia Preprocedure Evaluation (Addendum)
Anesthesia Evaluation  Patient identified by MRN, date of birth, ID band Patient awake    Reviewed: Allergy & Precautions, NPO status , Patient's Chart, lab work & pertinent test results  Airway Mallampati: IV  TM Distance: <3 FB Neck ROM: Full    Dental  (+) Dental Advisory Given   Pulmonary neg pulmonary ROS   breath sounds clear to auscultation       Cardiovascular negative cardio ROS  Rhythm:Regular Rate:Normal     Neuro/Psych negative neurological ROS     GI/Hepatic negative GI ROS, Neg liver ROS,,,  Endo/Other  negative endocrine ROS    Renal/GU negative Renal ROS     Musculoskeletal  (+) Arthritis ,    Abdominal   Peds  Hematology negative hematology ROS (+)   Anesthesia Other Findings   Reproductive/Obstetrics                              Lab Results  Component Value Date   WBC 10.6 (H) 04/28/2023   HGB 12.1 04/28/2023   HCT 37.8 04/28/2023   MCV 97.4 04/28/2023   PLT 266 04/28/2023   Lab Results  Component Value Date   CREATININE 0.50 04/28/2023   BUN 16 04/28/2023   NA 137 04/28/2023   K 3.9 04/28/2023   CL 106 04/28/2023   CO2 23 04/28/2023    Anesthesia Physical Anesthesia Plan  ASA: 2  Anesthesia Plan: Spinal   Post-op Pain Management: Ofirmev IV (intra-op)*   Induction:   PONV Risk Score and Plan: 2 and Propofol infusion, Dexamethasone and Ondansetron  Airway Management Planned: Natural Airway and Simple Face Mask  Additional Equipment: None  Intra-op Plan:   Post-operative Plan:   Informed Consent: I have reviewed the patients History and Physical, chart, labs and discussed the procedure including the risks, benefits and alternatives for the proposed anesthesia with the patient or authorized representative who has indicated his/her understanding and acceptance.     Dental advisory given  Plan Discussed with:   Anesthesia Plan Comments:          Anesthesia Quick Evaluation

## 2023-05-08 NOTE — Progress Notes (Signed)
Updated date of surgery: 05/10/23  Updated time of arrival: 0800  Drink Pre Surgery Ensure: 730 AM

## 2023-05-10 ENCOUNTER — Encounter (HOSPITAL_COMMUNITY): Payer: Self-pay | Admitting: Orthopedic Surgery

## 2023-05-10 ENCOUNTER — Ambulatory Visit (HOSPITAL_COMMUNITY): Payer: Medicare PPO

## 2023-05-10 ENCOUNTER — Encounter (HOSPITAL_COMMUNITY)
Admission: RE | Disposition: A | Discharge status: Home / Self Care | Payer: Self-pay | Source: Home / Self Care | Attending: Orthopedic Surgery

## 2023-05-10 ENCOUNTER — Observation Stay (HOSPITAL_COMMUNITY)
Admission: RE | Admit: 2023-05-10 | Discharge: 2023-05-11 | Disposition: A | Discharge status: Home / Self Care | Payer: Medicare PPO | Attending: Orthopedic Surgery | Admitting: Orthopedic Surgery

## 2023-05-10 ENCOUNTER — Other Ambulatory Visit: Payer: Self-pay

## 2023-05-10 ENCOUNTER — Ambulatory Visit (HOSPITAL_BASED_OUTPATIENT_CLINIC_OR_DEPARTMENT_OTHER): Payer: Medicare PPO | Admitting: Anesthesiology

## 2023-05-10 ENCOUNTER — Observation Stay (HOSPITAL_COMMUNITY): Payer: Medicare PPO

## 2023-05-10 ENCOUNTER — Ambulatory Visit (HOSPITAL_COMMUNITY): Payer: Medicare PPO | Admitting: Physician Assistant

## 2023-05-10 DIAGNOSIS — M1611 Unilateral primary osteoarthritis, right hip: Secondary | ICD-10-CM | POA: Diagnosis not present

## 2023-05-10 DIAGNOSIS — M169 Osteoarthritis of hip, unspecified: Secondary | ICD-10-CM | POA: Diagnosis present

## 2023-05-10 DIAGNOSIS — M2550 Pain in unspecified joint: Secondary | ICD-10-CM | POA: Diagnosis not present

## 2023-05-10 DIAGNOSIS — Z471 Aftercare following joint replacement surgery: Secondary | ICD-10-CM | POA: Diagnosis not present

## 2023-05-10 DIAGNOSIS — Z01818 Encounter for other preprocedural examination: Secondary | ICD-10-CM

## 2023-05-10 DIAGNOSIS — Z96641 Presence of right artificial hip joint: Secondary | ICD-10-CM | POA: Diagnosis not present

## 2023-05-10 HISTORY — PX: TOTAL HIP ARTHROPLASTY: SHX124

## 2023-05-10 HISTORY — DX: Left bundle-branch block, unspecified: I44.7

## 2023-05-10 HISTORY — DX: Thoracic aortic ectasia: I77.810

## 2023-05-10 LAB — ABO/RH: ABO/RH(D): O POS

## 2023-05-10 SURGERY — ARTHROPLASTY, HIP, TOTAL, ANTERIOR APPROACH
Anesthesia: Spinal | Site: Hip | Laterality: Right

## 2023-05-10 MED ORDER — METOCLOPRAMIDE HCL 5 MG/ML IJ SOLN: 5.0000 mg | Freq: Three times a day (TID) | INTRAMUSCULAR | Status: DC | PRN

## 2023-05-10 MED ORDER — FENTANYL CITRATE (PF) 100 MCG/2ML IJ SOLN
INTRAMUSCULAR | Status: DC | PRN
  Administered 2023-05-10 (×2): 50 ug via INTRAVENOUS

## 2023-05-10 MED ORDER — CHLORHEXIDINE GLUCONATE 0.12 % MT SOLN
15.0000 mL | Freq: Once | OROMUCOSAL | Status: AC
  Administered 2023-05-10: 15 mL via OROMUCOSAL

## 2023-05-10 MED ORDER — FENTANYL CITRATE PF 50 MCG/ML IJ SOSY
25.0000 ug | PREFILLED_SYRINGE | INTRAMUSCULAR | Status: DC | PRN
  Administered 2023-05-10 (×2): 50 ug via INTRAVENOUS

## 2023-05-10 MED ORDER — TRAMADOL HCL 50 MG PO TABS
50.0000 mg | ORAL_TABLET | Freq: Four times a day (QID) | ORAL | Status: DC | PRN
  Administered 2023-05-10: 50 mg via ORAL

## 2023-05-10 MED ORDER — MIDAZOLAM HCL 2 MG/2ML IJ SOLN
INTRAMUSCULAR | Status: AC
  Filled 2023-05-10: qty 2

## 2023-05-10 MED ORDER — PROPOFOL 1000 MG/100ML IV EMUL
INTRAVENOUS | Status: AC
  Filled 2023-05-10: qty 100

## 2023-05-10 MED ORDER — DEXAMETHASONE SODIUM PHOSPHATE 10 MG/ML IJ SOLN: 8.0000 mg | Freq: Once | INTRAMUSCULAR | Status: DC

## 2023-05-10 MED ORDER — TRANEXAMIC ACID-NACL 1000-0.7 MG/100ML-% IV SOLN
1000.0000 mg | INTRAVENOUS | Status: AC
  Administered 2023-05-10: 1000 mg via INTRAVENOUS
  Filled 2023-05-10: qty 100

## 2023-05-10 MED ORDER — LACTATED RINGERS IV SOLN: INTRAVENOUS | Status: DC

## 2023-05-10 MED ORDER — AMISULPRIDE (ANTIEMETIC) 5 MG/2ML IV SOLN: 10.0000 mg | Freq: Once | INTRAVENOUS | Status: DC | PRN

## 2023-05-10 MED ORDER — BISACODYL 10 MG RE SUPP: 10.0000 mg | Freq: Every day | RECTAL | Status: DC | PRN

## 2023-05-10 MED ORDER — ASPIRIN 81 MG PO CHEW
81.0000 mg | CHEWABLE_TABLET | Freq: Two times a day (BID) | ORAL | Status: DC
  Administered 2023-05-11: 81 mg via ORAL
  Filled 2023-05-10: qty 1

## 2023-05-10 MED ORDER — FENTANYL CITRATE PF 50 MCG/ML IJ SOSY
PREFILLED_SYRINGE | INTRAMUSCULAR | Status: AC
  Administered 2023-05-10: 50 ug via INTRAVENOUS
  Filled 2023-05-10: qty 3

## 2023-05-10 MED ORDER — TRAMADOL HCL 50 MG PO TABS
ORAL_TABLET | ORAL | Status: AC
  Filled 2023-05-10: qty 1

## 2023-05-10 MED ORDER — DEXAMETHASONE SODIUM PHOSPHATE 10 MG/ML IJ SOLN
10.0000 mg | Freq: Once | INTRAMUSCULAR | Status: AC
  Administered 2023-05-11: 10 mg via INTRAVENOUS
  Filled 2023-05-10: qty 1

## 2023-05-10 MED ORDER — ONDANSETRON HCL 4 MG PO TABS: 4.0000 mg | ORAL_TABLET | Freq: Four times a day (QID) | ORAL | Status: DC | PRN

## 2023-05-10 MED ORDER — BUPIVACAINE-EPINEPHRINE (PF) 0.25% -1:200000 IJ SOLN
INTRAMUSCULAR | Status: DC | PRN
  Administered 2023-05-10: 30 mL via PERINEURAL

## 2023-05-10 MED ORDER — ACETAMINOPHEN 325 MG PO TABS: 325.0000 mg | ORAL_TABLET | Freq: Four times a day (QID) | ORAL | Status: DC | PRN

## 2023-05-10 MED ORDER — BUPIVACAINE HCL (PF) 0.25 % IJ SOLN
INTRAMUSCULAR | Status: AC
  Filled 2023-05-10: qty 30

## 2023-05-10 MED ORDER — PHENOL 1.4 % MT LIQD: 1.0000 | OROMUCOSAL | Status: DC | PRN

## 2023-05-10 MED ORDER — MIDAZOLAM HCL 2 MG/2ML IJ SOLN
INTRAMUSCULAR | Status: DC | PRN
  Administered 2023-05-10 (×2): 1 mg via INTRAVENOUS

## 2023-05-10 MED ORDER — HYDROCODONE-ACETAMINOPHEN 5-325 MG PO TABS
1.0000 | ORAL_TABLET | ORAL | Status: DC | PRN
  Administered 2023-05-10: 2 via ORAL

## 2023-05-10 MED ORDER — FENTANYL CITRATE (PF) 100 MCG/2ML IJ SOLN
INTRAMUSCULAR | Status: AC
  Filled 2023-05-10: qty 2

## 2023-05-10 MED ORDER — EPHEDRINE SULFATE-NACL 50-0.9 MG/10ML-% IV SOSY
PREFILLED_SYRINGE | INTRAVENOUS | Status: DC | PRN
  Administered 2023-05-10 (×2): 5 mg via INTRAVENOUS

## 2023-05-10 MED ORDER — ONDANSETRON HCL 4 MG/2ML IJ SOLN: 4.0000 mg | Freq: Four times a day (QID) | INTRAMUSCULAR | Status: DC | PRN

## 2023-05-10 MED ORDER — DOCUSATE SODIUM 100 MG PO CAPS
100.0000 mg | ORAL_CAPSULE | Freq: Two times a day (BID) | ORAL | Status: DC
  Administered 2023-05-10 – 2023-05-11 (×2): 100 mg via ORAL
  Filled 2023-05-10 (×2): qty 1

## 2023-05-10 MED ORDER — POVIDONE-IODINE 10 % EX SWAB: 2.0000 | Freq: Once | CUTANEOUS | Status: DC

## 2023-05-10 MED ORDER — ORAL CARE MOUTH RINSE: 15.0000 mL | Freq: Once | OROMUCOSAL | Status: AC

## 2023-05-10 MED ORDER — DEXAMETHASONE SODIUM PHOSPHATE 10 MG/ML IJ SOLN
INTRAMUSCULAR | Status: DC | PRN
  Administered 2023-05-10: 10 mg via INTRAVENOUS

## 2023-05-10 MED ORDER — HYDROCODONE-ACETAMINOPHEN 5-325 MG PO TABS
ORAL_TABLET | ORAL | Status: AC
  Filled 2023-05-10: qty 2

## 2023-05-10 MED ORDER — ONDANSETRON HCL 4 MG/2ML IJ SOLN
INTRAMUSCULAR | Status: DC | PRN
  Administered 2023-05-10: 4 mg via INTRAVENOUS

## 2023-05-10 MED ORDER — WATER FOR IRRIGATION, STERILE IR SOLN
Status: DC | PRN
  Administered 2023-05-10: 2000 mL

## 2023-05-10 MED ORDER — DIPHENHYDRAMINE HCL 12.5 MG/5ML PO ELIX: 12.5000 mg | ORAL_SOLUTION | ORAL | Status: DC | PRN

## 2023-05-10 MED ORDER — ACETAMINOPHEN 10 MG/ML IV SOLN
1000.0000 mg | Freq: Four times a day (QID) | INTRAVENOUS | Status: DC
  Administered 2023-05-10: 1000 mg via INTRAVENOUS
  Filled 2023-05-10: qty 100

## 2023-05-10 MED ORDER — CEFAZOLIN SODIUM-DEXTROSE 2-4 GM/100ML-% IV SOLN
2.0000 g | INTRAVENOUS | Status: AC
  Administered 2023-05-10: 2 g via INTRAVENOUS
  Filled 2023-05-10: qty 100

## 2023-05-10 MED ORDER — PROPOFOL 10 MG/ML IV BOLUS
INTRAVENOUS | Status: DC | PRN
  Administered 2023-05-10: 10 mg via INTRAVENOUS
  Administered 2023-05-10: 20 mg via INTRAVENOUS

## 2023-05-10 MED ORDER — FLEET ENEMA 7-19 GM/118ML RE ENEM: 1.0000 | ENEMA | Freq: Once | RECTAL | Status: DC | PRN

## 2023-05-10 MED ORDER — METHOCARBAMOL 500 MG PO TABS
500.0000 mg | ORAL_TABLET | Freq: Four times a day (QID) | ORAL | Status: DC | PRN
  Administered 2023-05-11 (×2): 500 mg via ORAL
  Filled 2023-05-10 (×2): qty 1

## 2023-05-10 MED ORDER — ONDANSETRON HCL 4 MG/2ML IJ SOLN
INTRAMUSCULAR | Status: AC
  Filled 2023-05-10: qty 2

## 2023-05-10 MED ORDER — BUPIVACAINE IN DEXTROSE 0.75-8.25 % IT SOLN
INTRATHECAL | Status: DC | PRN
  Administered 2023-05-10: 1.8 mL via INTRATHECAL

## 2023-05-10 MED ORDER — LIDOCAINE HCL (PF) 2 % IJ SOLN
INTRAMUSCULAR | Status: AC
  Filled 2023-05-10: qty 5

## 2023-05-10 MED ORDER — 0.9 % SODIUM CHLORIDE (POUR BTL) OPTIME
TOPICAL | Status: DC | PRN
  Administered 2023-05-10: 1000 mL

## 2023-05-10 MED ORDER — LIDOCAINE 2% (20 MG/ML) 5 ML SYRINGE
INTRAMUSCULAR | Status: DC | PRN
  Administered 2023-05-10: 40 mg via INTRAVENOUS

## 2023-05-10 MED ORDER — MENTHOL 3 MG MT LOZG: 1.0000 | LOZENGE | OROMUCOSAL | Status: DC | PRN

## 2023-05-10 MED ORDER — METHOCARBAMOL 500 MG IVPB - SIMPLE MED
INTRAVENOUS | Status: AC
  Administered 2023-05-10: 500 mg via INTRAVENOUS
  Filled 2023-05-10: qty 55

## 2023-05-10 MED ORDER — PROPOFOL 500 MG/50ML IV EMUL
INTRAVENOUS | Status: DC | PRN
  Administered 2023-05-10: 50 ug/kg/min via INTRAVENOUS

## 2023-05-10 MED ORDER — MORPHINE SULFATE (PF) 2 MG/ML IV SOLN
1.0000 mg | INTRAVENOUS | Status: DC | PRN
  Administered 2023-05-10 – 2023-05-11 (×2): 1 mg via INTRAVENOUS
  Filled 2023-05-10 (×2): qty 1

## 2023-05-10 MED ORDER — POLYETHYLENE GLYCOL 3350 17 G PO PACK: 17.0000 g | PACK | Freq: Every day | ORAL | Status: DC | PRN

## 2023-05-10 MED ORDER — EPINEPHRINE PF 1 MG/ML IJ SOLN
INTRAMUSCULAR | Status: AC
  Filled 2023-05-10: qty 1

## 2023-05-10 MED ORDER — OXYCODONE HCL 5 MG PO TABS
ORAL_TABLET | ORAL | Status: AC
  Filled 2023-05-10: qty 1

## 2023-05-10 MED ORDER — METOCLOPRAMIDE HCL 5 MG PO TABS: 5.0000 mg | ORAL_TABLET | Freq: Three times a day (TID) | ORAL | Status: DC | PRN

## 2023-05-10 MED ORDER — PHENYLEPHRINE HCL (PRESSORS) 10 MG/ML IV SOLN
INTRAVENOUS | Status: AC
  Filled 2023-05-10: qty 1

## 2023-05-10 MED ORDER — OXYCODONE HCL 5 MG PO TABS
5.0000 mg | ORAL_TABLET | ORAL | Status: DC | PRN
  Administered 2023-05-10 – 2023-05-11 (×3): 10 mg via ORAL
  Filled 2023-05-10 (×4): qty 2

## 2023-05-10 MED ORDER — METHOCARBAMOL 500 MG IVPB - SIMPLE MED: 500.0000 mg | Freq: Four times a day (QID) | INTRAVENOUS | Status: DC | PRN

## 2023-05-10 MED ORDER — DEXAMETHASONE SODIUM PHOSPHATE 10 MG/ML IJ SOLN
INTRAMUSCULAR | Status: AC
  Filled 2023-05-10: qty 1

## 2023-05-10 MED ORDER — PROPOFOL 10 MG/ML IV BOLUS
INTRAVENOUS | Status: AC
  Filled 2023-05-10: qty 20

## 2023-05-10 MED ORDER — SODIUM CHLORIDE 0.9 % IV SOLN: INTRAVENOUS | Status: DC

## 2023-05-10 MED ORDER — CEFAZOLIN SODIUM-DEXTROSE 2-4 GM/100ML-% IV SOLN
2.0000 g | Freq: Four times a day (QID) | INTRAVENOUS | Status: AC
  Administered 2023-05-10 (×2): 2 g via INTRAVENOUS
  Filled 2023-05-10 (×2): qty 100

## 2023-05-10 SURGICAL SUPPLY — 44 items
ADH SKN CLS APL DERMABOND .7 (GAUZE/BANDAGES/DRESSINGS) ×1
BAG COUNTER SPONGE SURGICOUNT (BAG) IMPLANT
BAG DECANTER FOR FLEXI CONT (MISCELLANEOUS) IMPLANT
BAG SPEC THK2 15X12 ZIP CLS (MISCELLANEOUS)
BAG SPNG CNTER NS LX DISP (BAG)
BAG ZIPLOCK 12X15 (MISCELLANEOUS) IMPLANT
BLADE SAG 18X100X1.27 (BLADE) ×2 IMPLANT
COVER PERINEAL POST (MISCELLANEOUS) ×2 IMPLANT
COVER SURGICAL LIGHT HANDLE (MISCELLANEOUS) ×2 IMPLANT
CUP ACETBLR 48 OD SECTOR II (Hips) IMPLANT
DERMABOND ADVANCED .7 DNX12 (GAUZE/BANDAGES/DRESSINGS) ×2 IMPLANT
DRAPE FOOT SWITCH (DRAPES) ×2 IMPLANT
DRAPE STERI IOBAN 125X83 (DRAPES) ×2 IMPLANT
DRAPE U-SHAPE 47X51 STRL (DRAPES) ×4 IMPLANT
DRSG AQUACEL AG ADV 3.5X10 (GAUZE/BANDAGES/DRESSINGS) ×2 IMPLANT
DURAPREP 26ML APPLICATOR (WOUND CARE) ×2 IMPLANT
ELECT REM PT RETURN 15FT ADLT (MISCELLANEOUS) ×2 IMPLANT
GLOVE BIO SURGEON STRL SZ 6.5 (GLOVE) IMPLANT
GLOVE BIO SURGEON STRL SZ7.5 (GLOVE) IMPLANT
GLOVE BIO SURGEON STRL SZ8 (GLOVE) ×2 IMPLANT
GLOVE BIOGEL PI IND STRL 6.5 (GLOVE) IMPLANT
GLOVE BIOGEL PI IND STRL 7.0 (GLOVE) IMPLANT
GLOVE BIOGEL PI IND STRL 8 (GLOVE) ×2 IMPLANT
GOWN STRL REUS W/ TWL LRG LVL3 (GOWN DISPOSABLE) ×2 IMPLANT
GOWN STRL REUS W/ TWL XL LVL3 (GOWN DISPOSABLE) IMPLANT
GOWN STRL REUS W/TWL LRG LVL3 (GOWN DISPOSABLE) ×1
GOWN STRL REUS W/TWL XL LVL3 (GOWN DISPOSABLE)
HEAD FEM STD 28X+1.5 STRL (Hips) IMPLANT
HOLDER FOLEY CATH W/STRAP (MISCELLANEOUS) ×2 IMPLANT
KIT TURNOVER KIT A (KITS) IMPLANT
LINER MARATHON 28 48 (Hips) IMPLANT
MANIFOLD NEPTUNE II (INSTRUMENTS) ×2 IMPLANT
PACK ANTERIOR HIP CUSTOM (KITS) ×2 IMPLANT
PENCIL SMOKE EVACUATOR COATED (MISCELLANEOUS) ×2 IMPLANT
SPIKE FLUID TRANSFER (MISCELLANEOUS) ×2 IMPLANT
STEM FEM ACTIS STD SZ7 (Nail) IMPLANT
SUT ETHIBOND NAB CT1 #1 30IN (SUTURE) ×2 IMPLANT
SUT MNCRL AB 4-0 PS2 18 (SUTURE) ×2 IMPLANT
SUT STRATAFIX 0 PDS 27 VIOLET (SUTURE) ×1
SUT VIC AB 2-0 CT1 27 (SUTURE) ×2
SUT VIC AB 2-0 CT1 TAPERPNT 27 (SUTURE) ×4 IMPLANT
SUTURE STRATFX 0 PDS 27 VIOLET (SUTURE) ×2 IMPLANT
TRAY FOLEY MTR SLVR 16FR STAT (SET/KITS/TRAYS/PACK) ×2 IMPLANT
TUBE SUCTION HIGH CAP CLEAR NV (SUCTIONS) ×2 IMPLANT

## 2023-05-10 NOTE — Evaluation (Signed)
Physical Therapy Evaluation Patient Details Name: Samantha [name] MRN: [id number] DOB: 29-Sep-1946 Today's Date: 05/10/2023  History of Present Illness  Pt is 77 yo female admitted 05/10/23 for R anterior THA.  Pt with hx including but not limited to arthritis, HLD, sleep disorder  Clinical Impression  Pt is s/p THA resulting in the deficits listed below (see PT Problem List). At baseline, pt lives alone and is independent but struggling lately with mobility due to hip pain.  Daughter is in town to stay a few days but then pt will have limited support.  Pt's home has stairs to enter and multi-level.  She is initially staying in a family home that is level but hopeful to return to her home next week , so will need stair training.  Today, pt required min A for transfer and gait (ambulated 30') but did require mod cues.  Pt had good pain control and expected to progress well with therapy.   Pt will benefit from acute skilled PT to increase their independence and safety with mobility to facilitate discharge.   Family did ask about HHPT at d/c due to limited support and wanting pt to progress - notified PA.         Recommendations for follow up therapy are one component of a multi-disciplinary discharge planning process, led by the attending physician.  Recommendations may be updated based on patient status, additional functional criteria and insurance authorization.  Follow Up Recommendations       Assistance Recommended at Discharge Intermittent Supervision/Assistance  Patient can return home with the following  A little help with walking and/or transfers;A little help with bathing/dressing/bathroom;Assistance with cooking/housework;Help with stairs or ramp for entrance    Equipment Recommendations None recommended by PT  Recommendations for Other Services       Functional Status Assessment Patient has had a recent decline in their functional status and demonstrates the ability to make  significant improvements in function in a reasonable and predictable amount of time.     Precautions / Restrictions Precautions Precautions: Fall Restrictions Weight Bearing Restrictions: Yes RLE Weight Bearing: Weight bearing as tolerated      Mobility  Bed Mobility Overal bed mobility: Needs Assistance Bed Mobility: Supine to Sit     Supine to sit: Min assist     General bed mobility comments: increased cues and min A for R LE    Transfers Overall transfer level: Needs assistance Equipment used: Rolling walker (2 wheels) Transfers: Sit to/from Stand Sit to Stand: Min assist           General transfer comment: Cues for hand placement with light min A to rise    Ambulation/Gait Ambulation/Gait assistance: Min assist Gait Distance (Feet): 30 Feet Assistive device: Rolling walker (2 wheels) Gait Pattern/deviations: Step-to pattern, Decreased stride length Gait velocity: decraesed     General Gait Details: Mild unsteadiness requiring min a; cues for RW and sequencing  Stairs            Wheelchair Mobility    Modified Rankin (Stroke Patients Only)       Balance Overall balance assessment: Needs assistance Sitting-balance support: No upper extremity supported Sitting balance-Leahy Scale: Good     Standing balance support: Bilateral upper extremity supported, Reliant on assistive device for balance Standing balance-Leahy Scale: Poor                               Pertinent Vitals/Pain  Pain Assessment Pain Assessment: Faces Faces Pain Scale: Hurts a little bit Pain Location: R hip Pain Descriptors / Indicators: Discomfort Pain Intervention(s): Limited activity within patient's tolerance, Monitored during session, Premedicated before session, Repositioned, Relaxation, Ice applied (significant improvement with transfer OOB)    Home Living Family/patient expects to be discharged to:: Private residence Living Arrangements:  Alone Available Help at Discharge: Family;Available 24 hours/day Type of Home: House Home Access: Stairs to enter Entrance Stairs-Rails: Left Entrance Stairs-Number of Steps: 1   Home Layout: One level Home Equipment: Agricultural consultant (2 wheels);Cane - quad;Shower seat - built in;Hand held shower head;BSC/3in1 Additional Comments: Information above on house where pt will go for a few days.  Daughter lives in [place] but is in town and staying at a family home where the pt will go unitl early next week due to level entry, accessible bathroom, and one level.  Pt's house has 3 steps to enter no rails, Her bedroom is upstairs but has moved daybed downstairs to sunroom that is one step down, her bedroom up 10 +10 steps (Rails both sides but too far to reach both). Pt's house does have walk in shower down stairs (but no seat or hand held shower)    Prior Function Prior Level of Function : Independent/Modified Independent;Driving             Mobility Comments: Could ambulate in community but heavy dependence on cart. In home no AD but limited mobility ADLs Comments: Pt was indepednent with adls and iadls     Hand Dominance        Extremity/Trunk Assessment   Upper Extremity Assessment Upper Extremity Assessment: Overall WFL for tasks assessed    Lower Extremity Assessment Lower Extremity Assessment: LLE deficits/detail;RLE deficits/detail RLE Deficits / Details: Expected post op changes; ROM: WFL; MMT: ankle 5/5, knee 3/5, hip 2/5 RLE Sensation: WNL LLE Deficits / Details: ROM WFL; MMT 5/5 LLE Sensation: WNL    Cervical / Trunk Assessment Cervical / Trunk Assessment: Normal  Communication   Communication: HOH  Cognition Arousal/Alertness: Awake/alert Behavior During Therapy: WFL for tasks assessed/performed Overall Cognitive Status: Within Functional Limits for tasks assessed                                          General Comments General comments (skin  integrity, edema, etc.): Pt's daughter expressed concern that she will only be with pt till Monday (likely).  Pt has set up for friends to assist for about a week and possibility of aides assisting -but pt only planning on a week.  Daughter concerned would need more.  Discussed would likely be helpful to have some assist for at least 2-3 weeks.  Also discussed safety at home (staying downstairs initially, easy meals, etc).  They are both asking about HHPT to help progress since pt has limited support - notified PA    Exercises     Assessment/Plan    PT Assessment Patient needs continued PT services  PT Problem List Decreased strength;Pain;Decreased range of motion;Decreased activity tolerance;Decreased balance;Decreased mobility;Decreased knowledge of precautions;Decreased knowledge of use of DME       PT Treatment Interventions DME instruction;Therapeutic exercise;Gait training;Balance training;Stair training;Functional mobility training;Therapeutic activities;Patient/family education;Modalities    PT Goals (Current goals can be found in the Care Plan section)  Acute Rehab PT Goals Patient Stated Goal: return home PT Goal Formulation: With patient Time For  Goal Achievement: 05/24/23 Potential to Achieve Goals: Good    Frequency 7X/week     Co-evaluation               AM-PAC PT "6 Clicks" Mobility  Outcome Measure Help needed turning from your back to your side while in a flat bed without using bedrails?: A Little Help needed moving from lying on your back to sitting on the side of a flat bed without using bedrails?: A Lot (mod cues) Help needed moving to and from a bed to a chair (including a wheelchair)?: A Little Help needed standing up from a chair using your arms (e.g., wheelchair or bedside chair)?: A Little Help needed to walk in hospital room?: A Lot (mod cues) Help needed climbing 3-5 steps with a railing? : A Lot 6 Click Score: 15    End of Session Equipment  Utilized During Treatment: Gait belt Activity Tolerance: Patient tolerated treatment well Patient left: with chair alarm set;in chair;with call bell/phone within reach;with family/visitor present Nurse Communication: Mobility status PT Visit Diagnosis: Other abnormalities of gait and mobility (R26.89);Muscle weakness (generalized) (M62.81)    Time: 8119-1478 PT Time Calculation (min) (ACUTE ONLY): 34 min   Charges:   PT Evaluation $PT Eval Low Complexity: 1 Low PT Treatments $Gait Training: 8-22 mins        Anise Salvo, PT Acute Rehab [facility] [phone]   Samantha [name] 05/10/2023, 5:51 PM

## 2023-05-10 NOTE — Anesthesia Procedure Notes (Signed)
Spinal  Patient location during procedure: OR Start time: 05/10/2023 10:43 AM End time: 05/10/2023 10:48 AM Reason for block: surgical anesthesia Staffing Performed: anesthesiologist  Anesthesiologist: Marcene Duos, MD Performed by: Marcene Duos, MD Authorized by: Marcene Duos, MD   Preanesthetic Checklist Completed: patient identified, IV checked, site marked, risks and benefits discussed, surgical consent, monitors and equipment checked, pre-op evaluation and timeout performed Spinal Block Patient position: sitting Prep: DuraPrep Patient monitoring: heart rate, cardiac monitor, continuous pulse ox and blood pressure Approach: midline Location: L4-5 Injection technique: single-shot Needle Needle type: Pencan  Needle gauge: 24 G Needle length: 9 cm Assessment Sensory level: T4 Events: CSF return

## 2023-05-10 NOTE — Anesthesia Postprocedure Evaluation (Signed)
Anesthesia Post Note  Patient: Samantha [name]  Procedure(s) Performed: TOTAL HIP ARTHROPLASTY ANTERIOR APPROACH (Right: Hip)     Patient location during evaluation: PACU Anesthesia Type: Spinal Level of consciousness: awake and alert, oriented and patient cooperative Pain management: pain level controlled Vital Signs Assessment: post-procedure vital signs reviewed and stable Respiratory status: spontaneous breathing, nonlabored ventilation and respiratory function stable Cardiovascular status: blood pressure returned to baseline and stable Postop Assessment: no apparent nausea or vomiting, spinal receding, no headache and no backache Anesthetic complications: no   No notable events documented.  Last Vitals:  Vitals:   05/10/23 1300 05/10/23 1315  BP: (!) 119/95 122/65  Pulse: 77   Resp: 20   Temp:    SpO2: 95%     Last Pain:  Vitals:   05/10/23 1300  TempSrc:   PainSc: 2                  Lannie Fields

## 2023-05-10 NOTE — Discharge Instructions (Signed)
Ollen Gross, MD Total Joint Specialist [facility] [place] [address], [address] [place], [place] [address] [phone]  ANTERIOR APPROACH TOTAL HIP REPLACEMENT POSTOPERATIVE DIRECTIONS     Hip Rehabilitation, Guidelines Following Surgery  The results of a hip operation are greatly improved after range of motion and muscle strengthening exercises. Follow all safety measures which are given to protect your hip. If any of these exercises cause increased pain or swelling in your joint, decrease the amount until you are comfortable again. Then slowly increase the exercises. Call your caregiver if you have problems or questions.   BLOOD CLOT PREVENTION Take an 81 mg Aspirin two times a day for three weeks following surgery. Then take an 81 mg Aspirin once a day for three weeks. Then discontinue Aspirin. You may resume your vitamins/supplements upon discharge from the hospital. Do not take any NSAIDs (Advil, Aleve, Ibuprofen, Meloxicam, etc.) until you have discontinued the two 81 mg Aspirin.  HOME CARE INSTRUCTIONS  Remove items at home which could result in a fall. This includes throw rugs or furniture in walking pathways.  ICE to the affected hip as frequently as 20-30 minutes an hour and then as needed for pain and swelling. Continue to use ice on the hip for pain and swelling from surgery. You may notice swelling that will progress down to the foot and ankle. This is normal after surgery. Elevate the leg when you are not up walking on it.   Continue to use the breathing machine which will help keep your temperature down.  It is common for your temperature to cycle up and down following surgery, especially at night when you are not up moving around and exerting yourself.  The breathing machine keeps your lungs expanded and your temperature down.  DIET You may resume your previous home diet once your are discharged from the hospital.  DRESSING / WOUND CARE / SHOWERING You  have an adhesive waterproof bandage over the incision. Leave this in place until your first follow-up appointment. Once you remove this you will not need to place another bandage.  You may begin showering 3 days following surgery, but do not submerge the incision under water.  ACTIVITY For the first 3-5 days, it is important to rest and keep the operative leg elevated. You should, as a general rule, rest for 50 minutes and walk/stretch for 10 minutes per hour. After 5 days, you may slowly increase activity as tolerated.  Perform the exercises you were provided twice a day for about 15-20 minutes each session. Begin these 2 days following surgery. Walk with your walker as instructed. Use the walker until you are comfortable transitioning to a cane. Walk with the cane in the opposite hand of the operative leg. You may discontinue the cane once you are comfortable and walking steadily. Avoid periods of inactivity such as sitting longer than an hour when not asleep. This helps prevent blood clots.  Do not drive a car for 6 weeks or until released by your surgeon.  Do not drive while taking narcotics.  TED HOSE STOCKINGS Wear the elastic stockings on both legs for three weeks following surgery during the day. You may remove them at night while sleeping.  WEIGHT BEARING Weight bearing as tolerated with assist device (walker, cane, etc) as directed, use it as long as suggested by your surgeon or therapist, typically at least 4-6 weeks.  POSTOPERATIVE CONSTIPATION PROTOCOL Constipation - defined medically as fewer than three stools per week and severe constipation  as less than one stool per week.  One of the most common issues patients have following surgery is constipation.  Even if you have a regular bowel pattern at home, your normal regimen is likely to be disrupted due to multiple reasons following surgery.  Combination of anesthesia, postoperative narcotics, change in appetite and fluid intake all  can affect your bowels.  In order to avoid complications following surgery, here are some recommendations in order to help you during your recovery period.  Colace (docusate) - Pick up an over-the-counter form of Colace or another stool softener and take twice a day as long as you are requiring postoperative pain medications.  Take with a full glass of water daily.  If you experience loose stools or diarrhea, hold the colace until you stool forms back up.  If your symptoms do not get better within 1 week or if they get worse, check with your doctor. Dulcolax (bisacodyl) - Pick up over-the-counter and take as directed by the product packaging as needed to assist with the movement of your bowels.  Take with a full glass of water.  Use this product as needed if not relieved by Colace only.  MiraLax (polyethylene glycol) - Pick up over-the-counter to have on hand.  MiraLax is a solution that will increase the amount of water in your bowels to assist with bowel movements.  Take as directed and can mix with a glass of water, juice, soda, coffee, or tea.  Take if you go more than two days without a movement.Do not use MiraLax more than once per day. Call your doctor if you are still constipated or irregular after using this medication for 7 days in a row.  If you continue to have problems with postoperative constipation, please contact the office for further assistance and recommendations.  If you experience "the worst abdominal pain ever" or develop nausea or vomiting, please contact the office immediatly for further recommendations for treatment.  ITCHING  If you experience itching with your medications, try taking only a single pain pill, or even half a pain pill at a time.  You can also use Benadryl over the counter for itching or also to help with sleep.   MEDICATIONS See your medication summary on the "After Visit Summary" that the nursing staff will review with you prior to discharge.  You may have some  home medications which will be placed on hold until you complete the course of blood thinner medication.  It is important for you to complete the blood thinner medication as prescribed by your surgeon.  Continue your approved medications as instructed at time of discharge.  PRECAUTIONS If you experience chest pain or shortness of breath - call 911 immediately for transfer to the hospital emergency department.  If you develop a fever greater that 101 F, purulent drainage from wound, increased redness or drainage from wound, foul odor from the wound/dressing, or calf pain - CONTACT YOUR SURGEON.                                                   FOLLOW-UP APPOINTMENTS Make sure you keep all of your appointments after your operation with your surgeon and caregivers. You should call the office at the above phone number and make an appointment for approximately two weeks after the date of your surgery or on  the date instructed by your surgeon outlined in the "After Visit Summary".  RANGE OF MOTION AND STRENGTHENING EXERCISES  These exercises are designed to help you keep full movement of your hip joint. Follow your caregiver's or physical therapist's instructions. Perform all exercises about fifteen times, three times per day or as directed. Exercise both hips, even if you have had only one joint replacement. These exercises can be done on a training (exercise) mat, on the floor, on a table or on a bed. Use whatever works the best and is most comfortable for you. Use music or television while you are exercising so that the exercises are a pleasant break in your day. This will make your life better with the exercises acting as a break in routine you can look forward to.  Lying on your back, slowly slide your foot toward your buttocks, raising your knee up off the floor. Then slowly slide your foot back down until your leg is straight again.  Lying on your back spread your legs as far apart as you can without  causing discomfort.  Lying on your side, raise your upper leg and foot straight up from the floor as far as is comfortable. Slowly lower the leg and repeat.  Lying on your back, tighten up the muscle in the front of your thigh (quadriceps muscles). You can do this by keeping your leg straight and trying to raise your heel off the floor. This helps strengthen the largest muscle supporting your knee.  Lying on your back, tighten up the muscles of your buttocks both with the legs straight and with the knee bent at a comfortable angle while keeping your heel on the floor.   POST-OPERATIVE OPIOID TAPER INSTRUCTIONS: It is important to wean off of your opioid medication as soon as possible. If you do not need pain medication after your surgery it is ok to stop day one. Opioids include: Codeine, Hydrocodone(Norco, Vicodin), Oxycodone(Percocet, oxycontin) and hydromorphone amongst others.  Long term and even short term use of opiods can cause: Increased pain response Dependence Constipation Depression Respiratory depression And more.  Withdrawal symptoms can include Flu like symptoms Nausea, vomiting And more Techniques to manage these symptoms Hydrate well Eat regular healthy meals Stay active Use relaxation techniques(deep breathing, meditating, yoga) Do Not substitute Alcohol to help with tapering If you have been on opioids for less than two weeks and do not have pain than it is ok to stop all together.  Plan to wean off of opioids This plan should start within one week post op of your joint replacement. Maintain the same interval or time between taking each dose and first decrease the dose.  Cut the total daily intake of opioids by one tablet each day Next start to increase the time between doses. The last dose that should be eliminated is the evening dose.   IF YOU ARE TRANSFERRED TO A SKILLED REHAB FACILITY If the patient is transferred to a skilled rehab facility following release  from the hospital, a list of the current medications will be sent to the facility for the patient to continue.  When discharged from the skilled rehab facility, please have the facility set up the patient's Home Health Physical Therapy prior to being released. Also, the skilled facility will be responsible for providing the patient with their medications at time of release from the facility to include their pain medication, the muscle relaxants, and their blood thinner medication. If the patient is still at the rehab  at time of the two week follow up appointment, the skilled rehab facility will also need to assist the patient in arranging follow up appointment in our office and any transportation needs. ° °MAKE SURE YOU:  °Understand these instructions.  °Get help right away if you are not doing well or get worse.  ° ° °DENTAL ANTIBIOTICS: ° °In most cases prophylactic antibiotics for Dental procdeures after total joint surgery are not necessary. ° °Exceptions are as follows: ° °1. History of prior total joint infection ° °2. Severely immunocompromised (Organ Transplant, cancer chemotherapy, Rheumatoid biologic °meds such as Humera) ° °3. Poorly controlled diabetes (A1C &gt; 8.0, blood glucose over 200) ° °If you have one of these conditions, contact your surgeon for an antibiotic prescription, prior to your °dental procedure.  ° ° °Pick up stool softner and laxative for home use following surgery while on pain medications. °Do not submerge incision under water. °Please use good hand washing techniques while changing dressing each day. °May shower starting three days after surgery. °Please use a clean towel to pat the incision dry following showers. °Continue to use ice for pain and swelling after surgery. °Do not use any lotions or creams on the incision until instructed by your surgeon. ° °

## 2023-05-10 NOTE — Care Plan (Signed)
Ortho Bundle Case Management Note  Patient Details  Name: Samantha [name] MRN: [id number] Date of Birth: 12-Dec-1946                  R THA on 05-10-23 DCP:  Home with dtr vs hired caregivers x 48 hrs DME:  No needs PT:  HEP  DME Arranged:  N/A DME Agency:  NA  HH Arranged:  NA HH Agency:  NA  Additional Comments: Please contact me with any questions of if this plan should need to change.  Ennis Forts, RN,CCM [facility]  [phone] 05/10/2023, 8:38 AM

## 2023-05-10 NOTE — Interval H&P Note (Signed)
History and Physical Interval Note:  05/10/2023 8:30 AM  Samantha [name]  has presented today for surgery, with the diagnosis of right hip osteoarthritis.  The various methods of treatment have been discussed with the patient and family. After consideration of risks, benefits and other options for treatment, the patient has consented to  Procedure(s): TOTAL HIP ARTHROPLASTY ANTERIOR APPROACH (Right) as a surgical intervention.  The patient's history has been reviewed, patient examined, no change in status, stable for surgery.  I have reviewed the patient's chart and labs.  Questions were answered to the patient's satisfaction.     [name]

## 2023-05-10 NOTE — Transfer of Care (Signed)
Immediate Anesthesia Transfer of Care Note  Patient: Samantha [name]  Procedure(s) Performed: TOTAL HIP ARTHROPLASTY ANTERIOR APPROACH (Right: Hip)  Patient Location: PACU  Anesthesia Type:Spinal  Level of Consciousness: drowsy  Airway & Oxygen Therapy: Patient Spontanous Breathing and Patient connected to face mask oxygen  Post-op Assessment: Report given to RN and Post -op Vital signs reviewed and stable  Post vital signs: Reviewed and stable  Last Vitals:  Vitals Value Taken Time  BP 113/69 05/10/23 1231  Temp    Pulse 85 05/10/23 1232  Resp 20 05/10/23 1232  SpO2 100 % 05/10/23 1232  Vitals shown include unvalidated device data.  Last Pain:  Vitals:   05/10/23 0845  TempSrc: Oral  PainSc:          Complications: No notable events documented.

## 2023-05-10 NOTE — Op Note (Signed)
OPERATIVE REPORT- TOTAL HIP ARTHROPLASTY   PREOPERATIVE DIAGNOSIS: Osteoarthritis of the Right hip.   POSTOPERATIVE DIAGNOSIS: Osteoarthritis of the Right  hip.   PROCEDURE: Right total hip arthroplasty, anterior approach.   SURGEON: Ollen Gross, MD   ASSISTANT: Arther Abbott, PA-C  ANESTHESIA:  Spinal  ESTIMATED BLOOD LOSS:-300 mL    DRAINS: None  COMPLICATIONS: None   CONDITION: PACU - hemodynamically stable.   BRIEF CLINICAL NOTE: Samantha [name] is a 77 y.o. female who has advanced end-  stage arthritis of their Right  hip with progressively worsening pain and  dysfunction.The patient has failed nonoperative management and presents for  total hip arthroplasty.   PROCEDURE IN DETAIL: After successful administration of spinal  anesthetic, the traction boots for the [facility] bed were placed on both  feet and the patient was placed onto the [facility] bed, boots placed into the leg  holders. The Right hip was then isolated from the perineum with plastic  drapes and prepped and draped in the usual sterile fashion. ASIS and  greater trochanter were marked and a oblique incision was made, starting  at about 1 cm lateral and 2 cm distal to the ASIS and coursing towards  the anterior cortex of the femur. The skin was cut with a 10 blade  through subcutaneous tissue to the level of the fascia overlying the  tensor fascia lata muscle. The fascia was then incised in line with the  incision at the junction of the anterior third and posterior 2/3rd. The  muscle was teased off the fascia and then the interval between the TFL  and the rectus was developed. The Hohmann retractor was then placed at  the top of the femoral neck over the capsule. The vessels overlying the  capsule were cauterized and the fat on top of the capsule was removed.  A Hohmann retractor was then placed anterior underneath the rectus  femoris to give exposure to the entire anterior capsule. A T-shaped   capsulotomy was performed. The edges were tagged and the femoral head  was identified.       Osteophytes are removed off the superior acetabulum.  The femoral neck was then cut in situ with an oscillating saw. Traction  was then applied to the left lower extremity utilizing the [facility]  traction. The femoral head was then removed. Retractors were placed  around the acetabulum and then circumferential removal of the labrum was  performed. Osteophytes were also removed. Reaming starts at 45 mm to  medialize and  Increased in 2 mm increments to 47 mm. We reamed in  approximately 40 degrees of abduction, 20 degrees anteversion. A 48 mm  pinnacle acetabular shell was then impacted in anatomic position under  fluoroscopic guidance with excellent purchase. We did not need to place  any additional dome screws. A 28 mm neutral + 4 marathon liner was then  placed into the acetabular shell.       The femoral lift was then placed along the lateral aspect of the femur  just distal to the vastus ridge. The leg was  externally rotated and capsule  was stripped off the inferior aspect of the femoral neck down to the  level of the lesser trochanter, this was done with electrocautery. The femur was lifted after this was performed. The  leg was then placed in an extended and adducted position essentially delivering the femur. We also removed the capsule superiorly and the piriformis from the piriformis fossa to  gain excellent exposure of the  proximal femur. Rongeur was used to remove some cancellous bone to get  into the lateral portion of the proximal femur for placement of the  initial starter reamer. The starter broaches was placed  the starter broach  and was shown to go down the center of the canal. Broaching  with the Actis system was then performed starting at size 0  coursing  Up to size 7. A size 7 had excellent torsional and rotational  and axial stability. The trial standard offset neck was then  placed  with a 28 + 1.5 trial head. The hip was then reduced. We confirmed that  the stem was in the canal both on AP and lateral x-rays. It also has excellent sizing. The hip was reduced with outstanding stability through full extension and full external rotation.. AP pelvis was taken and the leg lengths were measured and found to be equal. Hip was then dislocated again and the femoral head and neck removed. The  femoral broach was removed. Size 7 Actis stem with a standard offset  neck was then impacted into the femur following native anteversion. Has  excellent purchase in the canal. Excellent torsional and rotational and  axial stability. It is confirmed to be in the canal on AP and lateral  fluoroscopic views. The 28 + 1.5 metal head was placed and the hip  reduced with outstanding stability. Again AP pelvis was taken and it  confirmed that the leg lengths were equal. The wound was then copiously  irrigated with saline solution and the capsule reattached and repaired  with Ethibond suture. 30 ml of .25% Bupivicaine was  injected into the capsule and into the edge of the tensor fascia lata as well as subcutaneous tissue. The fascia overlying the tensor fascia lata was then closed with a running #1 V-Loc. Subcu was closed with interrupted 2-0 Vicryl and subcuticular running 4-0 Monocryl. Incision was cleaned  and dried. Steri-Strips and a bulky sterile dressing applied. The patient was awakened and transported to  recovery in stable condition.        Please note that a surgical assistant was a medical necessity for this procedure to perform it in a safe and expeditious manner. Assistant was necessary to provide appropriate retraction of vital neurovascular structures and to prevent femoral fracture and allow for anatomic placement of the prosthesis.  Ollen Gross, M.D.

## 2023-05-11 ENCOUNTER — Ambulatory Visit: Payer: Medicare PPO | Admitting: Cardiovascular Disease

## 2023-05-11 ENCOUNTER — Encounter (HOSPITAL_COMMUNITY): Payer: Self-pay | Admitting: Orthopedic Surgery

## 2023-05-11 DIAGNOSIS — M1611 Unilateral primary osteoarthritis, right hip: Secondary | ICD-10-CM | POA: Diagnosis not present

## 2023-05-11 DIAGNOSIS — M2550 Pain in unspecified joint: Secondary | ICD-10-CM | POA: Diagnosis not present

## 2023-05-11 LAB — BASIC METABOLIC PANEL
Anion gap: 9 (ref 5–15)
BUN: 10 mg/dL (ref 8–23)
CO2: 23 mmol/L (ref 22–32)
Calcium: 8.2 mg/dL — ABNORMAL LOW (ref 8.9–10.3)
Chloride: 97 mmol/L — ABNORMAL LOW (ref 98–111)
Creatinine, Ser: 0.47 mg/dL (ref 0.44–1.00)
GFR, Estimated: >60 mL/min (ref >60)
Glucose, Bld: 154 mg/dL — ABNORMAL HIGH (ref 70–99)
Potassium: 3.4 mmol/L — ABNORMAL LOW (ref 3.5–5.1)
Sodium: 129 mmol/L — ABNORMAL LOW (ref 135–145)

## 2023-05-11 LAB — CBC
HCT: 35.2 % — ABNORMAL LOW (ref 36.0–46.0)
Hemoglobin: 11.4 g/dL — ABNORMAL LOW (ref 12.0–15.0)
MCH: 31.7 pg (ref 26.0–34.0)
MCHC: 32.4 g/dL (ref 30.0–36.0)
MCV: 97.8 fL (ref 80.0–100.0)
Platelets: 282 10*3/uL (ref 150–400)
RBC: 3.6 MIL/uL — ABNORMAL LOW (ref 3.87–5.11)
RDW: 11.7 % (ref 11.5–15.5)
WBC: 17 10*3/uL — ABNORMAL HIGH (ref 4.0–10.5)
nRBC: 0 % (ref 0.0–0.2)

## 2023-05-11 MED ORDER — METHOCARBAMOL 500 MG PO TABS: 500.0000 mg | ORAL_TABLET | Freq: Four times a day (QID) | ORAL | 0 refills | Status: DC | PRN

## 2023-05-11 MED ORDER — ASPIRIN 81 MG PO CHEW: 81.0000 mg | CHEWABLE_TABLET | Freq: Two times a day (BID) | ORAL | 0 refills | Status: AC

## 2023-05-11 MED ORDER — OXYCODONE HCL 5 MG PO TABS: 5.0000 mg | ORAL_TABLET | Freq: Four times a day (QID) | ORAL | 0 refills | Status: DC | PRN

## 2023-05-11 MED ORDER — TRAMADOL HCL 50 MG PO TABS: 50.0000 mg | ORAL_TABLET | Freq: Four times a day (QID) | ORAL | 0 refills | Status: DC | PRN

## 2023-05-11 MED ORDER — ONDANSETRON HCL 4 MG PO TABS: 4.0000 mg | ORAL_TABLET | Freq: Four times a day (QID) | ORAL | 0 refills | Status: DC | PRN

## 2023-05-11 NOTE — Progress Notes (Signed)
Physical Therapy Treatment Patient Details Name: Samantha [name] MRN: [id number] DOB: [birth year] Today's Date: 05/11/2023   History of Present Illness Pt is 77 yo female admitted 05/10/23 for R anterior THA.  Pt with hx including but not limited to arthritis, HLD, sleep disorder    PT Comments    Pt is POD #1 and progressing well.  She had good pain control and was able to ambulate 100' but did require frequent cues for safety and RW use.  Pt would benefit from further therapy prior to d/c to advance mobility, exercise, and safety as plan is for return home with family for a few days and HEP.     Recommendations for follow up therapy are one component of a multi-disciplinary discharge planning process, led by the attending physician.  Recommendations may be updated based on patient status, additional functional criteria and insurance authorization.  Follow Up Recommendations       Assistance Recommended at Discharge Intermittent Supervision/Assistance  Patient can return home with the following A little help with walking and/or transfers;A little help with bathing/dressing/bathroom;Assistance with cooking/housework;Help with stairs or ramp for entrance   Equipment Recommendations  None recommended by PT    Recommendations for Other Services       Precautions / Restrictions Precautions Precautions: Fall Restrictions RLE Weight Bearing: Weight bearing as tolerated     Mobility  Bed Mobility Overal bed mobility: Needs Assistance         General bed mobility comments: Pt in chair at arrival    Transfers Overall transfer level: Needs assistance Equipment used: Rolling walker (2 wheels) Transfers: Sit to/from Stand Sit to Stand: Min guard           General transfer comment: Cues for hand placement and R LEmanagement; performed x 3 during session    Ambulation/Gait Ambulation/Gait assistance: Min guard Gait Distance (Feet): 100 Feet Assistive device: Rolling walker  (2 wheels) Gait Pattern/deviations: Step-through pattern, Decreased weight shift to right Gait velocity: decreased     General Gait Details: Min guard for safety and frequent cues to stay close to RW particulary with turns   Stairs Stairs: Yes Stairs assistance: Min guard Stair Management: Two rails, Step to pattern, Forwards Number of Stairs: 3 General stair comments: cues for sequening   Wheelchair Mobility    Modified Rankin (Stroke Patients Only)       Balance Overall balance assessment: Needs assistance Sitting-balance support: No upper extremity supported Sitting balance-Leahy Scale: Good     Standing balance support: Bilateral upper extremity supported, Reliant on assistive device for balance, No upper extremity supported Standing balance-Leahy Scale: Fair Standing balance comment: could static stand without AD; RW to ambulate                            Cognition Arousal/Alertness: Awake/alert Behavior During Therapy: WFL for tasks assessed/performed Overall Cognitive Status: Within Functional Limits for tasks assessed                                          Exercises Total Joint Exercises Ankle Circles/Pumps: AROM, Both, 10 reps, Supine Quad Sets: AROM, Both, 10 reps, Supine Heel Slides: AAROM, Right, 10 reps, Supine Hip ABduction/ADduction: AAROM, Right, 10 reps, Supine Long Arc Quad: AROM, Right, 10 reps, Seated    General Comments  Educated on safe ice use, no pivots,  car transfers. Also, encouraged walking every 1-2 hours during day. Educated on HEP with focus on mobility the first weeks. Discussed doing exercises within pain control and if pain increasing could decreased ROM, reps, and stop exercises as needed. Encouraged to perform ankle pumps frequently for blood flow.  Pt reports plan to have daughter stay till Monday then friends assist for 3-4 days.  Discussed would likely need some level of help (particularly with  IADLs, meals, stairs) for 2-3 weeks.       Pertinent Vitals/Pain Pain Assessment Pain Assessment: 0-10 Pain Score: 3  Pain Location: R hip Pain Descriptors / Indicators: Discomfort Pain Intervention(s): Limited activity within patient's tolerance, Monitored during session, Repositioned, Ice applied (pt had declined meds prior to session)    Home Living                          Prior Function            PT Goals (current goals can now be found in the care plan section) Progress towards PT goals: Progressing toward goals    Frequency    7X/week      PT Plan Current plan remains appropriate    Co-evaluation              AM-PAC PT "6 Clicks" Mobility   Outcome Measure  Help needed turning from your back to your side while in a flat bed without using bedrails?: A Little Help needed moving from lying on your back to sitting on the side of a flat bed without using bedrails?: A Little Help needed moving to and from a bed to a chair (including a wheelchair)?: A Little Help needed standing up from a chair using your arms (e.g., wheelchair or bedside chair)?: A Little Help needed to walk in hospital room?: A Lot (mod cues) Help needed climbing 3-5 steps with a railing? : A Little 6 Click Score: 17    End of Session Equipment Utilized During Treatment: Gait belt Activity Tolerance: Patient tolerated treatment well Patient left: with chair alarm set;in chair;with call bell/phone within reach Nurse Communication: Mobility status PT Visit Diagnosis: Other abnormalities of gait and mobility (R26.89);Muscle weakness (generalized) (M62.81)     Time: 1610-9604 PT Time Calculation (min) (ACUTE ONLY): 25 min  Charges:  $Gait Training: 8-22 mins $Therapeutic Exercise: 8-22 mins                     Anise Salvo, PT Acute Rehab [facility] [phone]    Rayetta Humphrey 05/11/2023, 12:51 PM

## 2023-05-11 NOTE — Progress Notes (Signed)
Physical Therapy Treatment Patient Details Name: Samantha [name] MRN: [id number] DOB: [birth year] Today's Date: 05/11/2023   History of Present Illness Pt is 77 yo female admitted 05/10/23 for R anterior THA.  Pt with hx including but not limited to arthritis, HLD, sleep disorder    PT Comments    Pt is POD # 1 and is progressing well.  She has good pain control, is ambulating 100', and performed stairs similar to entry level and first floor.  She does need moderate cues for RW proximity.  Pt will have her daughter with her for a few days but then largely on her own.  Educated on safety and on advancement of HEP and mobility. Encouraged having friends to assist for 2-3 weeks at least.  Pt demonstrates safe gait & transfers in order to return home with supervision from PT perspective once discharged by MD.  While in hospital, will continue to benefit from PT for skilled therapy to advance mobility and exercises.      Recommendations for follow up therapy are one component of a multi-disciplinary discharge planning process, led by the attending physician.  Recommendations may be updated based on patient status, additional functional criteria and insurance authorization.  Follow Up Recommendations       Assistance Recommended at Discharge Intermittent Supervision/Assistance  Patient can return home with the following A little help with walking and/or transfers;A little help with bathing/dressing/bathroom;Assistance with cooking/housework;Help with stairs or ramp for entrance   Equipment Recommendations  None recommended by PT    Recommendations for Other Services       Precautions / Restrictions Precautions Precautions: Fall Restrictions RLE Weight Bearing: Weight bearing as tolerated     Mobility  Bed Mobility Overal bed mobility: Needs Assistance Bed Mobility: Supine to Sit     Supine to sit: Supervision     General bed mobility comments: Pt in chair at arrival     Transfers Overall transfer level: Needs assistance Equipment used: Rolling walker (2 wheels) Transfers: Sit to/from Stand Sit to Stand: Supervision           General transfer comment: Cues for hand placement and backing all the way to sit ; performed x 5 during session; practiced pushing from seat rather than armrest since her chair does not have armrest    Ambulation/Gait Ambulation/Gait assistance: Supervision Gait Distance (Feet): 100 Feet Assistive device: Rolling walker (2 wheels) Gait Pattern/deviations: Step-through pattern, Decreased weight shift to right Gait velocity: decreased     General Gait Details: Supervision but with moderate cues to stay close to RW; some improvement from am; educated daughter on proximity and providing cues   Stairs Stairs: Yes Stairs assistance: Min guard Stair Management: Step to pattern Number of Stairs: 7 General stair comments: Performed 3 steps with bil rails forward then progressed to 3 steps forward L rail to simulate home.  Also, did 1 platform step with RW to simulate stepping into her sun room.  Good carryover of sequencing.   Wheelchair Mobility    Modified Rankin (Stroke Patients Only)       Balance Overall balance assessment: Needs assistance Sitting-balance support: No upper extremity supported Sitting balance-Leahy Scale: Good     Standing balance support: Bilateral upper extremity supported, Reliant on assistive device for balance, No upper extremity supported Standing balance-Leahy Scale: Fair Standing balance comment: could static stand without AD; RW to ambulate  Cognition Arousal/Alertness: Awake/alert Behavior During Therapy: WFL for tasks assessed/performed Overall Cognitive Status: Within Functional Limits for tasks assessed                                          Exercises Total Joint Exercises Ankle Circles/Pumps: AROM, Both, 10 reps,  Supine Quad Sets: AROM, Both, 10 reps, Supine Heel Slides: AAROM, Right, 10 reps, Supine (educated on gait belt to assist and only doing as able) Hip ABduction/ADduction: AAROM, Right, 10 reps, Supine (educated on gait belt to assist and only doing as able) Long Arc Quad: AROM, Right, 10 reps, Seated Other Exercises Other Exercises: Standing R LE x 5 with RW and supervision: hip flex, ext, abd, and HS curl    General Comments General comments (skin integrity, edema, etc.): Daughter present throughout session.  Educated on safe ice use, no pivots, car transfers. Also, encouraged walking every 1-2 hours during day. Educated on HEP with focus on mobility the first weeks. Discussed doing exercises within pain control and if pain increasing could decreased ROM, reps, and stop exercises as needed. Encouraged to perform ankle pumps frequently for blood flow.  Educated on further home safety including easy microwave meals, call-in meals, having friends assist for 2-3 weeks at least with meals and iadls, staying on first level of home for a few weeks, having supervision with stairs, supervision for showers, shower chair, potential to have a RW upstairs and one downstairs once she starts going to second level.       Pertinent Vitals/Pain Pain Assessment Pain Assessment: 0-10 Pain Score: 3  Pain Location: R hip Pain Descriptors / Indicators: Discomfort Pain Intervention(s): Limited activity within patient's tolerance, Monitored during session, Premedicated before session, Repositioned, Ice applied    Home Living                          Prior Function            PT Goals (current goals can now be found in the care plan section) Progress towards PT goals: Progressing toward goals    Frequency    7X/week      PT Plan Current plan remains appropriate    Co-evaluation              AM-PAC PT "6 Clicks" Mobility   Outcome Measure  Help needed turning from your back to  your side while in a flat bed without using bedrails?: A Little Help needed moving from lying on your back to sitting on the side of a flat bed without using bedrails?: A Little Help needed moving to and from a bed to a chair (including a wheelchair)?: A Little Help needed standing up from a chair using your arms (e.g., wheelchair or bedside chair)?: A Little Help needed to walk in hospital room?: A Little Help needed climbing 3-5 steps with a railing? : A Little 6 Click Score: 18    End of Session Equipment Utilized During Treatment: Gait belt Activity Tolerance: Patient tolerated treatment well Patient left: in chair;with call bell/phone within reach;with family/visitor present Nurse Communication: Mobility status PT Visit Diagnosis: Other abnormalities of gait and mobility (R26.89);Muscle weakness (generalized) (M62.81)     Time: 1610-9604 PT Time Calculation (min) (ACUTE ONLY): 46 min  Charges:  $Gait Training: 8-22 mins $Therapeutic Exercise: 8-22 mins $Therapeutic Activity: 8-22 mins  Anise Salvo, PT Acute Rehab [facility] [phone]    Samantha [name] 05/11/2023, 3:57 PM

## 2023-05-11 NOTE — Progress Notes (Signed)
   Subjective: 1 Day Post-Op Procedure(s) (LRB): TOTAL HIP ARTHROPLASTY ANTERIOR APPROACH (Right) Patient seen in rounds by Dr. Lequita Halt. Patient is well, and has had no acute complaints or problems. Denies SOB or chest pain. Denies calf pain. Foley cath removed this AM. Patient reports pain as moderate. Worked with physical therapy yesterday and ambulated 30'. We will continue physical therapy today.   Objective: Vital signs in last 24 hours: Temp:  [97.1 F (36.2 C)-98.5 F (36.9 C)] 97.7 F (36.5 C) (05/16 0542) Pulse Rate:  [64-82] 64 (05/16 0542) Resp:  [15-20] 18 (05/16 0542) BP: (103-165)/(56-95) 113/57 (05/16 0542) SpO2:  [90 %-98 %] 97 % (05/16 0542) Weight:  [83.9 kg] 83.9 kg (05/15 0830)  Intake/Output from previous day:  Intake/Output Summary (Last 24 hours) at 05/11/2023 0727 Last data filed at 05/11/2023 0600 Gross per 24 hour  Intake 3502.5 ml  Output 1700 ml  Net 1802.5 ml     Intake/Output this shift: No intake/output data recorded.  Labs: Recent Labs    05/11/23 0340  HGB 11.4*   Recent Labs    05/11/23 0340  WBC 17.0*  RBC 3.60*  HCT 35.2*  PLT 282   Recent Labs    05/11/23 0340  NA 129*  K 3.4*  CL 97*  CO2 23  BUN 10  CREATININE 0.47  GLUCOSE 154*  CALCIUM 8.2*   No results for input(s): "LABPT", "INR" in the last 72 hours.  Exam: General - Patient is Alert and Oriented Extremity - Neurologically intact Neurovascular intact Sensation intact distally Dorsiflexion/Plantar flexion intact Dressing - dressing C/D/I Motor Function - intact, moving foot and toes well on exam.  Past Medical History:  Diagnosis Date   Aortic root dilation (HCC)    Arthritis    Colonic polyp    Left bundle branch block (LBBB)     Assessment/Plan: 1 Day Post-Op Procedure(s) (LRB): TOTAL HIP ARTHROPLASTY ANTERIOR APPROACH (Right) Principal Problem:   OA (osteoarthritis) of hip Active Problems:   Osteoarthritis of right hip  Estimated body  mass index is 34.39 kg/m as calculated from the following:   Height as of this encounter: 5' 1.5" (1.562 m).   Weight as of this encounter: 83.9 kg. Advance diet Up with therapy D/C IV fluids  DVT Prophylaxis - Aspirin Weight bearing as tolerated.  Continue physical therapy. Expected discharge home today pending progress with physical therapy and if meeting patient goals. Will do HEP once discharged. Follow-up in clinic in 2 weeks.  The PDMP database was reviewed today prior to any opioid medications being prescribed to this patient.  R. Arcola Jansky, PA-C Orthopedic Surgery 05/11/2023, 7:27 AM

## 2023-05-11 NOTE — Plan of Care (Signed)
  Problem: Education: Goal: Knowledge of the prescribed therapeutic regimen will improve Outcome: Progressing   Problem: Pain Management: Goal: Pain level will decrease with appropriate interventions Outcome: Progressing   Problem: Education: Goal: Knowledge of General Education information will improve Description: Including pain rating scale, medication(s)/side effects and non-pharmacologic comfort measures Outcome: Progressing   Problem: Safety: Goal: Ability to remain free from injury will improve Outcome: Progressing   

## 2023-05-11 NOTE — TOC Transition Note (Signed)
Transition of Care [facility]) - CM/SW Discharge Note  Patient Details  Name: Samantha [name] MRN: [id number] Date of Birth: 1946/06/06  [facility] CM/SW Contact:  Ewing Schlein, LCSW Phone Number: 05/11/2023, 10:09 AM  Clinical Narrative: Patient is expected to discharge home after working with PT. CSW met with patient to confirm discharge plan. Patient will go home with a home exercise program (HEP). Patient has a rolling walker, cane, and raised toilet seat at home so there were no DME needs at this time. TOC signing off.    Final next level of care: Home/Self Care Barriers to Discharge: No Barriers Identified  Patient Goals and CMS Choice Choice offered to / list presented to : NA  Discharge Plan and Services Additional resources added to the After Visit Summary for         DME Arranged: N/A DME Agency: NA HH Arranged: NA HH Agency: NA  Social Determinants of Health (SDOH) Interventions SDOH Screenings   Food Insecurity: No Food Insecurity (05/10/2023)  Housing: Low Risk  (05/10/2023)  Transportation Needs: No Transportation Needs (05/10/2023)  Utilities: Not At Risk (05/10/2023)  Alcohol Screen: Low Risk  (10/31/2022)  Depression (PHQ2-9): Low Risk  (10/31/2022)  Financial Resource Strain: Low Risk  (10/31/2022)  Physical Activity: Inactive (10/31/2022)  Social Connections: Moderately Integrated (10/31/2022)  Stress: No Stress Concern Present (10/31/2022)  Tobacco Use: Low Risk  (05/11/2023)   Readmission Risk Interventions     No data to display

## 2023-05-12 NOTE — Discharge Summary (Signed)
Physician Discharge Summary   Patient ID: Samantha [name] MRN: [id number] DOB/AGE: 23-Feb-1946 77 y.o.  Admit date: 05/10/2023 Discharge date: 05/11/2023  Primary Diagnosis: Osteoarthritis of the right hip.   Admission Diagnoses:  Past Medical History:  Diagnosis Date   Aortic root dilation (HCC)    Arthritis    Colonic polyp    Left bundle branch block (LBBB)    Discharge Diagnoses:   Principal Problem:   OA (osteoarthritis) of hip Active Problems:   Osteoarthritis of right hip  Estimated body mass index is 34.39 kg/m as calculated from the following:   Height as of this encounter: 5' 1.5" (1.562 m).   Weight as of this encounter: 83.9 kg.  Procedure:  Procedure(s) (LRB): TOTAL HIP ARTHROPLASTY ANTERIOR APPROACH (Right)   Consults: None  HPI: Samantha [name] is a 77 y.o. female who has advanced end-stage arthritis of their Right  hip with progressively worsening pain and dysfunction.The patient has failed nonoperative management and presents for  total hip arthroplasty.   Laboratory Data: Admission on 05/10/2023, Discharged on 05/11/2023  Component Date Value Ref Range Status   ABO/RH(D) 05/10/2023    Final                   Value:O POS Performed at [facility], [address], [place], [place] [address]    WBC 05/11/2023 17.0 (H)  4.0 - 10.5 K/uL Final   RBC 05/11/2023 3.60 (L)  3.87 - 5.11 MIL/uL Final   Hemoglobin 05/11/2023 11.4 (L)  12.0 - 15.0 g/dL Final   HCT 19/14/7829 35.2 (L)  36.0 - 46.0 % Final   MCV 05/11/2023 97.8  80.0 - 100.0 fL Final   MCH 05/11/2023 31.7  26.0 - 34.0 pg Final   MCHC 05/11/2023 32.4  30.0 - 36.0 g/dL Final   RDW 56/21/3086 11.7  11.5 - 15.5 % Final   Platelets 05/11/2023 282  150 - 400 K/uL Final   nRBC 05/11/2023 0.0  0.0 - 0.2 % Final   Performed at [facility], [address] [place], [place] [address]   Sodium 05/11/2023 129 (L)  135 - 145 mmol/L Final   Potassium 05/11/2023 3.4 (L)  3.5  - 5.1 mmol/L Final   Chloride 05/11/2023 97 (L)  98 - 111 mmol/L Final   CO2 05/11/2023 23  22 - 32 mmol/L Final   Glucose, Bld 05/11/2023 154 (H)  70 - 99 mg/dL Final   Glucose reference range applies only to samples taken after fasting for at least 8 hours.   BUN 05/11/2023 10  8 - 23 mg/dL Final   Creatinine, Ser 05/11/2023 0.47  0.44 - 1.00 mg/dL Final   Calcium 96/29/5284 8.2 (L)  8.9 - 10.3 mg/dL Final   GFR, Estimated 05/11/2023 >60  >60 mL/min Final   Comment: (NOTE) Calculated using the CKD-EPI Creatinine Equation (2021)    Anion gap 05/11/2023 9  5 - 15 Final   Performed at [facility], [address] [place], [place] [address]  Hospital Outpatient Visit on 04/28/2023  Component Date Value Ref Range Status   MRSA, PCR 04/28/2023 NEGATIVE  NEGATIVE Final   Staphylococcus aureus 04/28/2023 NEGATIVE  NEGATIVE Final   Comment: (NOTE) The Xpert SA Assay (FDA approved for NASAL specimens in patients 10 years of age and older), is one component of a comprehensive surveillance program. It is not intended to diagnose infection nor to guide or monitor treatment. Performed at [facility], [address] [place], [place] [address]    WBC 04/28/2023 10.6 (H)  4.0 - 10.5 K/uL Final   RBC 04/28/2023 3.88  3.87 - 5.11 MIL/uL Final   Hemoglobin 04/28/2023 12.1  12.0 - 15.0 g/dL Final   HCT 60/45/4098 37.8  36.0 - 46.0 % Final   MCV 04/28/2023 97.4  80.0 - 100.0 fL Final   MCH 04/28/2023 31.2  26.0 - 34.0 pg Final   MCHC 04/28/2023 32.0  30.0 - 36.0 g/dL Final   RDW 11/91/4782 12.0  11.5 - 15.5 % Final   Platelets 04/28/2023 266  150 - 400 K/uL Final   nRBC 04/28/2023 0.0  0.0 - 0.2 % Final   Performed at [facility], [address] [place], [place] [address]   ABO/RH(D) 04/28/2023 O POS   Final   Antibody Screen 04/28/2023 NEG   Final   Sample Expiration 04/28/2023 05/12/2023,2359   Final   Extend sample reason 04/28/2023     Final                   Value:NO TRANSFUSIONS OR PREGNANCY IN THE PAST 3 MONTHS Performed at [facility], [address] [place], [place] [address]    Sodium 04/28/2023 137  135 - 145 mmol/L Final   Potassium 04/28/2023 3.9  3.5 - 5.1 mmol/L Final   Chloride 04/28/2023 106  98 - 111 mmol/L Final   CO2 04/28/2023 23  22 - 32 mmol/L Final   Glucose, Bld 04/28/2023 92  70 - 99 mg/dL Final   Glucose reference range applies only to samples taken after fasting for at least 8 hours.   BUN 04/28/2023 16  8 - 23 mg/dL Final   Creatinine, Ser 04/28/2023 0.50  0.44 - 1.00 mg/dL Final   Calcium 78/46/9629 8.7 (L)  8.9 - 10.3 mg/dL Final   GFR, Estimated 04/28/2023 >60  >60 mL/min Final   Comment: (NOTE) Calculated using the CKD-EPI Creatinine Equation (2021)    Anion gap 04/28/2023 8  5 - 15 Final   Performed at [facility], [address], [place], [place] [address]     X-Rays:DG Pelvis Portable  Result Date: 05/10/2023 CLINICAL DATA:  Status post total right hip arthroplasty EXAM: PORTABLE PELVIS 1-2 VIEWS COMPARISON:  Pelvis and right hip radiographs 11/08/2021 FINDINGS: Status post total right hip arthroplasty. No perihardware lucency is seen to indicate hardware failure or loosening. Expected postoperative subcutaneous air lateral greater than medial to the hip arthroplasty. Mild-to-moderate left femoroacetabular joint space narrowing. Mild-to-moderate pubic symphysis joint space narrowing. Mild left-greater-than-right inferior sacroiliac subchondral sclerosis. No acute fracture or dislocation. IMPRESSION: Status post total right hip arthroplasty without evidence of hardware failure. Electronically Signed   By: Neita Garnet M.D.   On: 05/10/2023 13:32   DG HIP UNILAT WITH PELVIS 1V RIGHT  Result Date: 05/10/2023 CLINICAL DATA:  Status post total right hip arthroplasty. EXAM: DG HIP (WITH OR WITHOUT PELVIS) 1V RIGHT COMPARISON:  Pelvis and right hip  radiographs 11/08/2021 FINDINGS: Images were performed intraoperatively without the presence of a radiologist. Interval total right hip arthroplasty. No hardware complication is seen. Total fluoroscopy images: 3 Total fluoroscopy time: 4 seconds Total dose: Radiation Exposure Index (as provided by the fluoroscopic device): 0.447 mGy air Kerma Please see intraoperative findings for further detail. IMPRESSION: Intraoperative images during total right hip arthroplasty. Electronically Signed   By: Neita Garnet M.D.   On: 05/10/2023 12:28   DG  C-Arm 1-60 Min-No Report  Result Date: 05/10/2023 Fluoroscopy was utilized by the requesting physician.  No radiographic interpretation.   DG C-Arm 1-60 Min-No Report  Result Date: 05/10/2023 Fluoroscopy was utilized by the requesting physician.  No radiographic interpretation.    EKG: Orders placed or performed in visit on 05/03/23   EKG 12-Lead     Hospital Course: LATEESHA MUSCH is a 77 y.o. who was admitted to [facility]. They were brought to the operating room on 05/10/2023 and underwent Procedure(s): TOTAL HIP ARTHROPLASTY ANTERIOR APPROACH.  Patient tolerated the procedure well and was later transferred to the recovery room and then to the orthopaedic floor for postoperative care. They were given PO and IV analgesics for pain control following their surgery. They were given 24 hours of postoperative antibiotics of  Anti-infectives (From admission, onward)    Start     Dose/Rate Route Frequency Ordered Stop   05/10/23 1700  ceFAZolin (ANCEF) IVPB 2g/100 mL premix        2 g 200 mL/hr over 30 Minutes Intravenous Every 6 hours 05/10/23 1358 05/10/23 2303   05/10/23 0815  ceFAZolin (ANCEF) IVPB 2g/100 mL premix        2 g 200 mL/hr over 30 Minutes Intravenous On call to O.R. 05/10/23 0809 05/10/23 1054      and started on DVT prophylaxis in the form of Aspirin.   PT and OT were ordered for total joint protocol. Discharge planning consulted to  help with postop disposition and equipment needs.  Patient had a fair night on the evening of surgery. They started to get up OOB with therapy on POD #0. Pt was seen during rounds and was ready to go home pending progress with therapy. She worked with therapy on POD #1 and was meeting her goals. Pt was discharged to home later that day in stable condition.  Diet: Regular diet Activity: WBAT Follow-up: in 2 weeks Disposition: Home Discharged Condition: stable   Discharge Instructions     Call MD / Call 911   Complete by: As directed    If you experience chest pain or shortness of breath, CALL 911 and be transported to the hospital emergency room.  If you develope a fever above 101 F, pus (white drainage) or increased drainage or redness at the wound, or calf pain, call your surgeon's office.   Change dressing   Complete by: As directed    You have an adhesive waterproof bandage over the incision. Leave this in place until your first follow-up appointment. Once you remove this you will not need to place another bandage.   Constipation Prevention   Complete by: As directed    Drink plenty of fluids.  Prune juice may be helpful.  You may use a stool softener, such as Colace (over the counter) 100 mg twice a day.  Use MiraLax (over the counter) for constipation as needed.   Diet - low sodium heart healthy   Complete by: As directed    Do not sit on low chairs, stoools or toilet seats, as it may be difficult to get up from low surfaces   Complete by: As directed    Driving restrictions   Complete by: As directed    No driving for two weeks   Post-operative opioid taper instructions:   Complete by: As directed    POST-OPERATIVE OPIOID TAPER INSTRUCTIONS: It is important to wean off of your opioid medication as soon as possible. If you do not need  pain medication after your surgery it is ok to stop day one. Opioids include: Codeine, Hydrocodone(Norco, Vicodin), Oxycodone(Percocet, oxycontin)  and hydromorphone amongst others.  Long term and even short term use of opiods can cause: Increased pain response Dependence Constipation Depression Respiratory depression And more.  Withdrawal symptoms can include Flu like symptoms Nausea, vomiting And more Techniques to manage these symptoms Hydrate well Eat regular healthy meals Stay active Use relaxation techniques(deep breathing, meditating, yoga) Do Not substitute Alcohol to help with tapering If you have been on opioids for less than two weeks and do not have pain than it is ok to stop all together.  Plan to wean off of opioids This plan should start within one week post op of your joint replacement. Maintain the same interval or time between taking each dose and first decrease the dose.  Cut the total daily intake of opioids by one tablet each day Next start to increase the time between doses. The last dose that should be eliminated is the evening dose.      TED hose   Complete by: As directed    Use stockings (TED hose) for three weeks on both leg(s).  You may remove them at night for sleeping.   Weight bearing as tolerated   Complete by: As directed       Allergies as of 05/11/2023   No Known Allergies      Medication List     STOP taking these medications    Aleve 220 MG tablet Generic drug: naproxen sodium       TAKE these medications    acetaminophen 500 MG tablet Commonly known as: TYLENOL Take 500-1,000 mg by mouth every 6 (six) hours as needed for mild pain or headache. 2 tablets   aspirin 81 MG chewable tablet Chew 1 tablet (81 mg total) by mouth 2 (two) times daily for 20 days. Then take one 81 mg aspirin once a day for three weeks. Then discontinue aspirin.   methocarbamol 500 MG tablet Commonly known as: ROBAXIN Take 1 tablet (500 mg total) by mouth every 6 (six) hours as needed for muscle spasms.   ondansetron 4 MG tablet Commonly known as: ZOFRAN Take 1 tablet (4 mg total) by  mouth every 6 (six) hours as needed for nausea.   oxyCODONE 5 MG immediate release tablet Commonly known as: Oxy IR/ROXICODONE Take 1-2 tablets (5-10 mg total) by mouth every 6 (six) hours as needed for severe pain.   traMADol 50 MG tablet Commonly known as: ULTRAM Take 1-2 tablets (50-100 mg total) by mouth every 6 (six) hours as needed for moderate pain.               Discharge Care Instructions  (From admission, onward)           Start     Ordered   05/11/23 0000  Weight bearing as tolerated        05/11/23 0730   05/11/23 0000  Change dressing       Comments: You have an adhesive waterproof bandage over the incision. Leave this in place until your first follow-up appointment. Once you remove this you will not need to place another bandage.   05/11/23 0730            Follow-up Information     Ollen Gross, MD. Go on 05/23/2023.   Specialty: Orthopedic Surgery Why: You are scheduled for a follow up appointment on 05-23-23 at 3:15 pm Contact information: [address] [place] [address] [phone]                 Signed: R. Arcola Jansky, PA-C Orthopedic Surgery 05/12/2023, 1:05 PM

## 2023-05-16 ENCOUNTER — Encounter: Payer: Self-pay | Admitting: Cardiovascular Disease

## 2023-05-16 NOTE — Telephone Encounter (Signed)
Error

## 2023-05-19 ENCOUNTER — Encounter: Payer: Self-pay | Admitting: Internal Medicine

## 2023-05-19 ENCOUNTER — Telehealth: Payer: Self-pay | Admitting: Internal Medicine

## 2023-05-19 NOTE — Telephone Encounter (Signed)
Pt is calling and had hip surgery on 05-07-2023 by dr Despina Hick and per dr Despina Hick will not order home pt therapy. Pt said she is unable to go to office for PT and wanted dr Fabian Sharp to know this info

## 2023-05-22 NOTE — Telephone Encounter (Signed)
Not sure how to help  :would need  visit or surgical team to order home pt . (usually PT is not ordered  for routine hip replacement anterior approach.) However  be assured Most people can  get information about  advised activity and plan after hip replacement

## 2023-05-24 NOTE — Telephone Encounter (Signed)
FYI  Contact Dr. Deri Fuelling office and spoke to [place]. She said they placed the PT order in through home health. And someone should be contacting pt.   Message was sent to pt's mychart message.

## 2023-05-28 DIAGNOSIS — I480 Paroxysmal atrial fibrillation: Secondary | ICD-10-CM | POA: Diagnosis not present

## 2023-05-28 DIAGNOSIS — I495 Sick sinus syndrome: Secondary | ICD-10-CM | POA: Diagnosis not present

## 2023-05-28 DIAGNOSIS — I4892 Unspecified atrial flutter: Secondary | ICD-10-CM | POA: Diagnosis not present

## 2023-05-28 DIAGNOSIS — I5022 Chronic systolic (congestive) heart failure: Secondary | ICD-10-CM | POA: Diagnosis not present

## 2023-05-28 DIAGNOSIS — G8929 Other chronic pain: Secondary | ICD-10-CM | POA: Diagnosis not present

## 2023-05-28 DIAGNOSIS — F909 Attention-deficit hyperactivity disorder, unspecified type: Secondary | ICD-10-CM | POA: Diagnosis not present

## 2023-05-28 DIAGNOSIS — I340 Nonrheumatic mitral (valve) insufficiency: Secondary | ICD-10-CM | POA: Diagnosis not present

## 2023-05-28 DIAGNOSIS — I110 Hypertensive heart disease with heart failure: Secondary | ICD-10-CM | POA: Diagnosis not present

## 2023-05-28 DIAGNOSIS — Z471 Aftercare following joint replacement surgery: Secondary | ICD-10-CM | POA: Diagnosis not present

## 2023-05-30 DIAGNOSIS — G8929 Other chronic pain: Secondary | ICD-10-CM | POA: Diagnosis not present

## 2023-05-30 DIAGNOSIS — F909 Attention-deficit hyperactivity disorder, unspecified type: Secondary | ICD-10-CM | POA: Diagnosis not present

## 2023-05-30 DIAGNOSIS — I340 Nonrheumatic mitral (valve) insufficiency: Secondary | ICD-10-CM | POA: Diagnosis not present

## 2023-05-30 DIAGNOSIS — I495 Sick sinus syndrome: Secondary | ICD-10-CM | POA: Diagnosis not present

## 2023-05-30 DIAGNOSIS — I5022 Chronic systolic (congestive) heart failure: Secondary | ICD-10-CM | POA: Diagnosis not present

## 2023-05-30 DIAGNOSIS — I110 Hypertensive heart disease with heart failure: Secondary | ICD-10-CM | POA: Diagnosis not present

## 2023-05-30 DIAGNOSIS — I480 Paroxysmal atrial fibrillation: Secondary | ICD-10-CM | POA: Diagnosis not present

## 2023-05-30 DIAGNOSIS — I4892 Unspecified atrial flutter: Secondary | ICD-10-CM | POA: Diagnosis not present

## 2023-05-30 DIAGNOSIS — Z471 Aftercare following joint replacement surgery: Secondary | ICD-10-CM | POA: Diagnosis not present

## 2023-06-02 DIAGNOSIS — I340 Nonrheumatic mitral (valve) insufficiency: Secondary | ICD-10-CM | POA: Diagnosis not present

## 2023-06-02 DIAGNOSIS — Z471 Aftercare following joint replacement surgery: Secondary | ICD-10-CM | POA: Diagnosis not present

## 2023-06-02 DIAGNOSIS — I4892 Unspecified atrial flutter: Secondary | ICD-10-CM | POA: Diagnosis not present

## 2023-06-02 DIAGNOSIS — G8929 Other chronic pain: Secondary | ICD-10-CM | POA: Diagnosis not present

## 2023-06-02 DIAGNOSIS — I495 Sick sinus syndrome: Secondary | ICD-10-CM | POA: Diagnosis not present

## 2023-06-02 DIAGNOSIS — I480 Paroxysmal atrial fibrillation: Secondary | ICD-10-CM | POA: Diagnosis not present

## 2023-06-02 DIAGNOSIS — I5022 Chronic systolic (congestive) heart failure: Secondary | ICD-10-CM | POA: Diagnosis not present

## 2023-06-02 DIAGNOSIS — F909 Attention-deficit hyperactivity disorder, unspecified type: Secondary | ICD-10-CM | POA: Diagnosis not present

## 2023-06-02 DIAGNOSIS — I110 Hypertensive heart disease with heart failure: Secondary | ICD-10-CM | POA: Diagnosis not present

## 2023-06-05 DIAGNOSIS — I480 Paroxysmal atrial fibrillation: Secondary | ICD-10-CM | POA: Diagnosis not present

## 2023-06-05 DIAGNOSIS — I4892 Unspecified atrial flutter: Secondary | ICD-10-CM | POA: Diagnosis not present

## 2023-06-05 DIAGNOSIS — Z471 Aftercare following joint replacement surgery: Secondary | ICD-10-CM | POA: Diagnosis not present

## 2023-06-05 DIAGNOSIS — F909 Attention-deficit hyperactivity disorder, unspecified type: Secondary | ICD-10-CM | POA: Diagnosis not present

## 2023-06-05 DIAGNOSIS — I495 Sick sinus syndrome: Secondary | ICD-10-CM | POA: Diagnosis not present

## 2023-06-05 DIAGNOSIS — I340 Nonrheumatic mitral (valve) insufficiency: Secondary | ICD-10-CM | POA: Diagnosis not present

## 2023-06-05 DIAGNOSIS — I5022 Chronic systolic (congestive) heart failure: Secondary | ICD-10-CM | POA: Diagnosis not present

## 2023-06-05 DIAGNOSIS — I110 Hypertensive heart disease with heart failure: Secondary | ICD-10-CM | POA: Diagnosis not present

## 2023-06-05 DIAGNOSIS — G8929 Other chronic pain: Secondary | ICD-10-CM | POA: Diagnosis not present

## 2023-06-07 DIAGNOSIS — F909 Attention-deficit hyperactivity disorder, unspecified type: Secondary | ICD-10-CM | POA: Diagnosis not present

## 2023-06-07 DIAGNOSIS — I340 Nonrheumatic mitral (valve) insufficiency: Secondary | ICD-10-CM | POA: Diagnosis not present

## 2023-06-07 DIAGNOSIS — Z471 Aftercare following joint replacement surgery: Secondary | ICD-10-CM | POA: Diagnosis not present

## 2023-06-07 DIAGNOSIS — I110 Hypertensive heart disease with heart failure: Secondary | ICD-10-CM | POA: Diagnosis not present

## 2023-06-07 DIAGNOSIS — I4892 Unspecified atrial flutter: Secondary | ICD-10-CM | POA: Diagnosis not present

## 2023-06-07 DIAGNOSIS — I5022 Chronic systolic (congestive) heart failure: Secondary | ICD-10-CM | POA: Diagnosis not present

## 2023-06-07 DIAGNOSIS — I480 Paroxysmal atrial fibrillation: Secondary | ICD-10-CM | POA: Diagnosis not present

## 2023-06-07 DIAGNOSIS — I495 Sick sinus syndrome: Secondary | ICD-10-CM | POA: Diagnosis not present

## 2023-06-07 DIAGNOSIS — G8929 Other chronic pain: Secondary | ICD-10-CM | POA: Diagnosis not present

## 2023-06-09 DIAGNOSIS — I5022 Chronic systolic (congestive) heart failure: Secondary | ICD-10-CM | POA: Diagnosis not present

## 2023-06-09 DIAGNOSIS — I110 Hypertensive heart disease with heart failure: Secondary | ICD-10-CM | POA: Diagnosis not present

## 2023-06-09 DIAGNOSIS — G8929 Other chronic pain: Secondary | ICD-10-CM | POA: Diagnosis not present

## 2023-06-09 DIAGNOSIS — I4892 Unspecified atrial flutter: Secondary | ICD-10-CM | POA: Diagnosis not present

## 2023-06-09 DIAGNOSIS — F909 Attention-deficit hyperactivity disorder, unspecified type: Secondary | ICD-10-CM | POA: Diagnosis not present

## 2023-06-09 DIAGNOSIS — Z471 Aftercare following joint replacement surgery: Secondary | ICD-10-CM | POA: Diagnosis not present

## 2023-06-09 DIAGNOSIS — I340 Nonrheumatic mitral (valve) insufficiency: Secondary | ICD-10-CM | POA: Diagnosis not present

## 2023-06-09 DIAGNOSIS — I495 Sick sinus syndrome: Secondary | ICD-10-CM | POA: Diagnosis not present

## 2023-06-09 DIAGNOSIS — I480 Paroxysmal atrial fibrillation: Secondary | ICD-10-CM | POA: Diagnosis not present

## 2023-06-14 ENCOUNTER — Ambulatory Visit: Payer: Medicare PPO | Attending: Cardiovascular Disease | Admitting: Cardiovascular Disease

## 2023-06-14 ENCOUNTER — Encounter: Payer: Self-pay | Admitting: Cardiovascular Disease

## 2023-06-14 VITALS — BP 110/66 | HR 81 | Ht 61.5 in | Wt 190.0 lb

## 2023-06-14 DIAGNOSIS — I447 Left bundle-branch block, unspecified: Secondary | ICD-10-CM | POA: Diagnosis not present

## 2023-06-14 DIAGNOSIS — I77819 Aortic ectasia, unspecified site: Secondary | ICD-10-CM

## 2023-06-14 NOTE — Progress Notes (Signed)
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 She has been having exertional intrascapular pain for the past several months. He typically hurts if she climbs several flights of stairs or walks up a hill.  There is no associated dyspnea or syncope.  I've seen her in the remote past. We performed a stress test results which cannot be found today but she reports that they were normal.   Feb. 12, 2024 Samantha [name] is a 77 y.o. female with a hx of chest pain  She was last seen 11 years ago    Has had recent CP Her mother died of an MI  She had CP while eating chicken .  Thought it may have been indigestion   She has been seen by Dr. Tereso [name] recently  Was found to have a LBBB .  Echo and cor CTA were ordered.   Coronary CT angiogram was performed.  This revealed very mildly dilated ascending thoracic aorta measuring 4.0 cm.  They recommended annual follow-up for this.  ( She is very concerned about this )   Advised her to avoid flouroquinolones due to possible worsening of aortic aneurisms .    Coronaries:  CAC  score is 0  RCA :   mild soft plaque LM : normal  LAD :  normal  LCx :   normal   Echo - scheduled    She walks regularly ,   only has DOE if she is walking up a hill or if she is pushing the pace .  Walks 2 miles several times a week . No CP with walking   Wt is 188 .   LDL was 102  in Nov. 2022  Will repeat her LDL and LP(A)    March 08, 2023 Samantha [name] is seen for follow up of her LBBB  Cor CTA 01/27/23  shows minimal CAD in the RCA.  CAC is 0.  Mild dilatation of the ascending aorta at 4.0  cm.   Plan is for aortic CTA in 1 year   Echo:  normal LVEF of 55-60%.  Grade I Dd  She was prescribed valsartan 40 mg a day but she did not start it yet    LP(a) is 12 LDL 02/06/23 is 97   June 14, 2023  She has had her hip replacement   Has LBBB She did not start taking her valsartan  (never tried it , will take off list )  She found an EKG from October, 1999 which reveals QRS duration.  Her left bundle branch clearly has developed since that time. Mildly dilated asc. Aorta 4.0 cm   Echocardiogram from February, 2024 shows normal left ventricular systolic function with an LVEF of 55 to 60%.  She has grade 1 (mild) diastolic dysfunction.  RV function and size are normal. Mild aortic insufficiency.  Coronary CT angiogram from January 26, 2023 reveals mild, nonobstructive coronary artery disease.  Coronary calcium score is 0. Ascending aorta measured 4.0 cm.  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   Procedure: TOTAL HIP ARTHROPLASTY ANTERIOR APPROACH;  Surgeon: Samantha Gross, MD;  Location: [facility];  Service: Orthopedics;  Laterality: Right;    Current Medications: Current Meds  Medication Sig   acetaminophen (TYLENOL) 500 MG tablet Take 500-1,000 mg by mouth every 6 (six) hours as needed for mild pain or headache. 2 tablets   methocarbamol (ROBAXIN) 500 MG tablet Take 1 tablet (500 mg total) by mouth every 6 (six) hours as needed for muscle spasms.   ondansetron (ZOFRAN) 4 MG tablet Take 1 tablet (4 mg total) by mouth every 6 (six) hours as needed for nausea.   oxyCODONE (OXY IR/ROXICODONE) 5 MG immediate release tablet Take 1-2 tablets (5-10 mg total) by mouth every 6 (six) hours as needed for severe pain.   traMADol (ULTRAM) 50 MG  tablet Take 1-2 tablets (50-100 mg total) by mouth every 6 (six) hours as needed for moderate pain.     Allergies:   Patient has no known allergies.   Social History   Socioeconomic History   Marital status: Divorced    Spouse name: Not on file   Number of children: Not on file   Years of education: Not on file   Highest education level: Not on file  Occupational History   Not on file  Tobacco Use   Smoking status: Never   Smokeless tobacco: Never  Vaping Use   Vaping Use: Never used  Substance and Sexual Activity   Alcohol use: Yes    Alcohol/week: 10.0 standard drinks of alcohol    Types: 10 Glasses of wine per week    Comment: rae   Drug use: No   Sexual activity: Not on file  Other Topics Concern   Not on file  Social History Narrative   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   Regular exercise -  Only ocass now   Hh of 1    etoh 1-2 per week   Daughter murdered in her house   Social Determinants of Health   Financial Resource Strain: Low Risk  (10/31/2022)   Overall Financial Resource Strain (CARDIA)    Difficulty of Paying Living Expenses: Not hard at all  Food Insecurity: No Food Insecurity (05/10/2023)   Hunger Vital Sign    Worried About Running Out of Food in the Last Year: Never true    Ran Out of Food in the Last Year: Never true  Transportation Needs: No Transportation Needs (05/10/2023)   PRAPARE - Administrator, Civil Service (Medical): No    Lack of Transportation (Non-Medical): No  Physical Activity: Inactive (10/31/2022)   Exercise Vital Sign    Days of Exercise per Week: 0 days    Minutes of Exercise per Session: 0 min  Stress: No Stress Concern Present (10/31/2022)   [employer] of Occupational Health - Occupational Stress Questionnaire    Feeling of Stress : Not at all  Social Connections: Moderately Integrated (10/31/2022)   Social Connection and Isolation Panel [NHANES]    Frequency of  Communication with Friends and Family: More than three times a week  Frequency of Social Gatherings with Friends and Family: More than three times a week    Attends Religious Services: More than 4 times per year    Active Member of [employer] or Organizations: Yes    Attends Engineer, structural: More than 4 times per year    Marital Status: Never married     Family History: The patient's family history includes Cancer in her sister; Heart attack in her mother; Hypertension in her mother; Other in her daughter.  ROS:   Please see the history of present illness.     All other systems reviewed and are negative.  EKGs/Labs/Other Studies Reviewed:    The following studies were reviewed today:   EKG:      Recent Labs: 05/11/2023: BUN 10; Creatinine, Ser 0.47; Hemoglobin 11.4; Platelets 282; Potassium 3.4; Sodium 129  Recent Lipid Panel    Component Value Date/Time   CHOL 205 (H) 02/06/2023 1533   TRIG 194 (H) 02/06/2023 1533   HDL 75 02/06/2023 1533   CHOLHDL 2.7 02/06/2023 1533   CHOLHDL 3 11/08/2021 1157   VLDL 21.0 11/08/2021 1157   LDLCALC 97 02/06/2023 1533   LDLDIRECT 136.0 05/15/2018 1446     Risk Assessment/Calculations:        Physical Exam:     Physical Exam: Blood pressure 110/66, pulse 81, height 5' 1.5" (1.562 m), weight 190 lb (86.2 kg), SpO2 95 %.       GEN:  Well nourished, well developed in no acute distress HEENT: Normal NECK: No JVD; No carotid bruits LYMPHATICS: No lymphadenopathy CARDIAC: RRR , no murmurs, rubs, gallops RESPIRATORY:  Clear to auscultation without rales, wheezing or rhonchi  ABDOMEN: Soft, non-tender, non-distended MUSCULOSKELETAL:  No edema; No deformity ,  walking with a cane today ( following hip replacement )  SKIN: Warm and dry NEUROLOGIC:  Alert and oriented x 3    ASSESSMENT:    1. LBBB (left bundle branch block)   2. Aortic dilatation (HCC)      PLAN:       LBBB  -coronary CT angiogram reveals only  minor coronary artery irregularities.  No ischemic heart disease to explain her left bundle branch block.       2.  Dilated ascending aorta : CTA shows an ascending aorta of 4.0 cm.  Will continue to follow.  Will anticipate repeating that again in 1 year.   3.  Mild CAD :    4.  Hyperlipidemia : LDL from November, 2022 was 102.    LP(a) is 12 LDL 02/06/23 is 97   Medication Adjustments/Labs and Tests Ordered: Current medicines are reviewed at length with the patient today.  Concerns regarding medicines are outlined above.  No orders of the defined types were placed in this encounter.  No orders of the defined types were placed in this encounter.   Patient Instructions  Medication Instructions:  Your physician recommends that you continue on your current medications as directed. Please refer to the Current Medication list given to you today.  *If you need a refill on your cardiac medications before your next appointment, please call your pharmacy*   Lab Work: NONE If you have labs (blood work) drawn today and your tests are completely normal, you will receive your results only by: MyChart Message (if you have MyChart) OR A paper copy in the mail If you have any lab test that is abnormal or we need to change your treatment, we will call you to review the  results.   Testing/Procedures: NONE   Follow-Up: At [facility], you and your health needs are our priority.  As part of our continuing mission to provide you with exceptional heart care, we have created designated Provider Care Teams.  These Care Teams include your primary Cardiologist (physician) and Advanced Practice Providers (APPs -  Physician Assistants and Nurse Practitioners) who all work together to provide you with the care you need, when you need it.  We recommend signing up for the patient portal called "MyChart".  Sign up information is provided on this After Visit Summary.  MyChart is used to connect  with patients for Virtual Visits (Telemedicine).  Patients are able to view lab/test results, encounter notes, upcoming appointments, etc.  Non-urgent messages can be sent to your provider as well.   To learn more about what you can do with MyChart, go to [email or web address].    Your next appointment:   6 month(s)  Provider:   Kristeen Miss, MD        Signed, Samantha Miss, MD  06/14/2023 5:00 PM    [facility]

## 2023-06-14 NOTE — Patient Instructions (Signed)
Medication Instructions:  Your physician recommends that you continue on your current medications as directed. Please refer to the Current Medication list given to you today.  *If you need a refill on your cardiac medications before your next appointment, please call your pharmacy*   Lab Work: NONE If you have labs (blood work) drawn today and your tests are completely normal, you will receive your results only by: MyChart Message (if you have MyChart) OR A paper copy in the mail If you have any lab test that is abnormal or we need to change your treatment, we will call you to review the results.   Testing/Procedures: NONE   Follow-Up: At  [facility], you and your health needs are our priority.  As part of our continuing mission to provide you with exceptional heart care, we have created designated Provider Care Teams.  These Care Teams include your primary Cardiologist (physician) and Advanced Practice Providers (APPs -  Physician Assistants and Nurse Practitioners) who all work together to provide you with the care you need, when you need it.  We recommend signing up for the patient portal called "MyChart".  Sign up information is provided on this After Visit Summary.  MyChart is used to connect with patients for Virtual Visits (Telemedicine).  Patients are able to view lab/test results, encounter notes, upcoming appointments, etc.  Non-urgent messages can be sent to your provider as well.   To learn more about what you can do with MyChart, go to [email or web address].    Your next appointment:   6 month(s)  Provider:   Philip Nahser, MD      

## 2023-06-19 DIAGNOSIS — M1990 Unspecified osteoarthritis, unspecified site: Secondary | ICD-10-CM | POA: Diagnosis not present

## 2023-06-19 DIAGNOSIS — I509 Heart failure, unspecified: Secondary | ICD-10-CM | POA: Diagnosis not present

## 2023-06-19 DIAGNOSIS — E785 Hyperlipidemia, unspecified: Secondary | ICD-10-CM | POA: Diagnosis not present

## 2023-06-19 DIAGNOSIS — R030 Elevated blood-pressure reading, without diagnosis of hypertension: Secondary | ICD-10-CM | POA: Diagnosis not present

## 2023-06-19 DIAGNOSIS — Z8249 Family history of ischemic heart disease and other diseases of the circulatory system: Secondary | ICD-10-CM | POA: Diagnosis not present

## 2023-06-19 DIAGNOSIS — N181 Chronic kidney disease, stage 1: Secondary | ICD-10-CM | POA: Diagnosis not present

## 2023-06-19 DIAGNOSIS — E669 Obesity, unspecified: Secondary | ICD-10-CM | POA: Diagnosis not present

## 2023-06-19 DIAGNOSIS — H9193 Unspecified hearing loss, bilateral: Secondary | ICD-10-CM | POA: Diagnosis not present

## 2023-06-19 DIAGNOSIS — I719 Aortic aneurysm of unspecified site, without rupture: Secondary | ICD-10-CM | POA: Diagnosis not present

## 2023-06-21 DIAGNOSIS — Z5189 Encounter for other specified aftercare: Secondary | ICD-10-CM | POA: Diagnosis not present

## 2023-07-04 ENCOUNTER — Other Ambulatory Visit (HOSPITAL_COMMUNITY): Payer: Medicare PPO

## 2023-07-18 ENCOUNTER — Encounter: Payer: Self-pay | Admitting: Cardiovascular Disease

## 2023-07-19 ENCOUNTER — Telehealth: Payer: Self-pay

## 2023-07-19 NOTE — Telephone Encounter (Signed)
Call to patient to discuss symptoms and valsartan use, which is not listed as a current medication. No answer, left detailed message per DPR asking patient to call our office.

## 2023-07-19 NOTE — Telephone Encounter (Signed)
See MyChart message 07/19/23.

## 2023-07-19 NOTE — Telephone Encounter (Signed)
Patient is returning call. Requesting return call.  

## 2023-08-07 ENCOUNTER — Ambulatory Visit: Payer: Medicare PPO | Admitting: Cardiovascular Disease

## 2023-08-08 DIAGNOSIS — Z5189 Encounter for other specified aftercare: Secondary | ICD-10-CM | POA: Diagnosis not present

## 2023-08-18 ENCOUNTER — Encounter: Payer: Self-pay | Admitting: Family Medicine

## 2023-08-18 ENCOUNTER — Ambulatory Visit: Payer: Medicare PPO | Admitting: Family Medicine

## 2023-08-18 VITALS — BP 166/86 | HR 80 | Temp 98.0°F | Ht 61.5 in | Wt 186.0 lb

## 2023-08-18 DIAGNOSIS — R102 Pelvic and perineal pain: Secondary | ICD-10-CM | POA: Diagnosis not present

## 2023-08-18 DIAGNOSIS — R3989 Other symptoms and signs involving the genitourinary system: Secondary | ICD-10-CM | POA: Diagnosis not present

## 2023-08-18 LAB — POC URINALSYSI DIPSTICK (AUTOMATED)
Bilirubin, UA: NEGATIVE
Blood, UA: POSITIVE
Glucose, UA: NEGATIVE
Ketones, UA: NEGATIVE
Nitrite, UA: NEGATIVE
Protein, UA: POSITIVE — AB
Spec Grav, UA: 1.025 (ref 1.010–1.025)
Urobilinogen, UA: 0.2 E.U./dL
pH, UA: 5 (ref 5.0–8.0)

## 2023-08-18 MED ORDER — AMOXICILLIN 500 MG PO TABS
500.0000 mg | ORAL_TABLET | Freq: Two times a day (BID) | ORAL | 0 refills | Status: AC
Start: 2023-08-18 — End: 2023-08-25

## 2023-08-18 NOTE — Telephone Encounter (Signed)
Please refer to message on AVS regarding results.

## 2023-08-18 NOTE — Patient Instructions (Addendum)
The sample of your urine had a few white blood cells and red blood cells in it.  This sometimes can indicate infection but can also be present for other causes.  We will send your urine for culture to see if it grows any bacteria.  A prescription for amoxicillin was sent to your pharmacy.  This is an antibiotic that you can take for possible UTI while we are awaiting the culture results.

## 2023-08-18 NOTE — Progress Notes (Signed)
Established Patient Office Visit   Subjective  Patient ID: Samantha [name], female    DOB: 1946-08-05  Age: 77 y.o. MRN: [id number]  Chief Complaint  Patient presents with   Abdominal Pain    Pt her for lower abdominal pain, Frequent urination with no burning    Patient is a 77 year old female followed by Dr. Fabian Sharp and seen for acute concern.  Patient endorses suprapubic pain x 6 days.  Patient denies dysuria, constipation, nausea, vomiting, back pain, fever, chills, frequency, decreased UOP, pain that moves.  Patient states after looking up information online she obtained a urine sample in a jar which was cloudy and brown in color.  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   Procedure: TOTAL HIP ARTHROPLASTY ANTERIOR APPROACH;  Surgeon: Ollen Gross, MD;  Location: [facility];  Service: Orthopedics;  Laterality: Right;   Social History   Tobacco Use   Smoking status: Never   Smokeless tobacco: Never  Vaping Use   Vaping status: Never Used  Substance Use Topics   Alcohol use: Yes    Alcohol/week: 10.0 standard drinks of alcohol    Types: 10 Glasses of wine per week    Comment: rae   Drug use: No   Family History  Problem Relation Age of Onset   Cancer Sister        colon - 70   Heart attack Mother        43   Hypertension Mother    Other Daughter        murdered   No Known Allergies    Review of Systems  Gastrointestinal:  Positive for abdominal pain.   Negative unless stated above    Objective:     BP (!) 166/86   Pulse 80   Temp 98 F (36.7 C)   Ht 5' 1.5" (1.562 m)   Wt 186 lb (84.4 kg)   SpO2 95%   BMI 34.58 kg/m    Physical Exam Constitutional:      General: She is not in acute distress.     Appearance: Normal appearance.  HENT:     Head: Normocephalic and atraumatic.     Nose: Nose normal.     Mouth/Throat:     Mouth: Mucous membranes are moist.  Cardiovascular:     Rate and Rhythm: Normal rate and regular rhythm.     Heart sounds: Normal heart sounds.  Pulmonary:     Effort: Pulmonary effort is normal.     Breath sounds: Normal breath sounds.  Abdominal:     General: Bowel sounds are normal.     Palpations: Abdomen is soft.     Tenderness: There is abdominal tenderness in the suprapubic area. There is no right CVA tenderness, left CVA tenderness, guarding or rebound.  Skin:    General: Skin is warm and dry.  Neurological:     Mental Status: She is alert and oriented to person, place, and time.      Results for orders placed or performed in visit on 08/18/23  POCT Urinalysis Dipstick (Automated)  Result Value Ref Range   Color, UA yellow    Clarity, UA cloudy  Glucose, UA Negative Negative   Bilirubin, UA negative    Ketones, UA negative    Spec Grav, UA 1.025 1.010 - 1.025   Blood, UA positive    pH, UA 5.0 5.0 - 8.0   Protein, UA Positive (A) Negative   Urobilinogen, UA 0.2 0.2 or 1.0 E.U./dL   Nitrite, UA negative    Leukocytes, UA Large (3+) (A) Negative      Assessment & Plan:  Suspected UTI -     Urine Culture -     Amoxicillin; Take 1 tablet (500 mg total) by mouth 2 (two) times daily for 7 days.  Dispense: 14 tablet; Refill: 0  Suprapubic pain -     POCT Urinalysis Dipstick (Automated) -     Urine Culture -     Amoxicillin; Take 1 tablet (500 mg total) by mouth 2 (two) times daily for 7 days.  Dispense: 14 tablet; Refill: 0  New problem.  Suprapubic pressure x 6 days.  POC UA with 3+ leuks, RBCs, protein.  Will obtain UCX.  Start amoxicillin while awaiting culture results.  Increase p.o. intake of water and fluids.  Given precautions.  Return if symptoms worsen or fail to improve.   Deeann Saint, MD

## 2023-08-19 LAB — URINE CULTURE
MICRO NUMBER:: 15374047
Result:: NO GROWTH
SPECIMEN QUALITY:: ADEQUATE

## 2023-08-25 NOTE — Telephone Encounter (Signed)
Pt is calling and would like clarification . Pt would like to know if she had UTI

## 2023-08-29 ENCOUNTER — Ambulatory Visit: Payer: Medicare PPO | Admitting: Internal Medicine

## 2023-08-29 ENCOUNTER — Encounter: Payer: Self-pay | Admitting: Internal Medicine

## 2023-08-29 VITALS — BP 116/60 | HR 72 | Temp 97.7°F | Ht 61.5 in | Wt 188.4 lb

## 2023-08-29 DIAGNOSIS — R8281 Pyuria: Secondary | ICD-10-CM | POA: Diagnosis not present

## 2023-08-29 DIAGNOSIS — Z96641 Presence of right artificial hip joint: Secondary | ICD-10-CM

## 2023-08-29 LAB — POCT URINALYSIS DIPSTICK
Bilirubin, UA: NEGATIVE
Blood, UA: NEGATIVE
Glucose, UA: NEGATIVE
Ketones, UA: NEGATIVE
Nitrite, UA: NEGATIVE
Protein, UA: NEGATIVE
Spec Grav, UA: 1.015 (ref 1.010–1.025)
Urobilinogen, UA: 0.2 U/dL
pH, UA: 7 (ref 5.0–8.0)

## 2023-08-29 NOTE — Progress Notes (Signed)
Chief Complaint  Patient presents with   Follow-up    Pt is here to follow up on UTI. Pt states sx resolved.     HPI: Samantha [name] 77 y.o. come in for problem fu abnormal ua and confusion about dx  of UTI or not.  fu concern  she is 3 mos out from   tha  right  and doing ok.   Had cramping suprapubic areaa few weeks ago  and thoth was hip  but noted cloudy urine? If pink  and was seen for urine uti possible.ua ds showed 3+ leuk pos bl and prot     And was rx with amoxicillin. But then ucx reported as NG so told she didn't have uti . She continued to take the amoxicilling and has 2 more days  she currently has no sx but is concered about what happened.  No hx of recurrent utis  Gm on father side had ovarian cancer  no hx of stone.  HIp is rehabing on schedule. ROS: See pertinent positives and negatives per HPI.  Past Medical History:  Diagnosis Date   Aortic root dilation (HCC)    Arthritis    Colonic polyp    Left bundle branch block (LBBB)     Family History  Problem Relation Age of Onset   Cancer Sister        colon - 19   Heart attack Mother        27   Hypertension Mother    Other Daughter        murdered    Social History   Socioeconomic History   Marital status: Divorced    Spouse name: Not on file   Number of children: Not on file   Years of education: Not on file   Highest education level: Not on file  Occupational History   Not on file  Tobacco Use   Smoking status: Never   Smokeless tobacco: Never  Vaping Use   Vaping status: Never Used  Substance and Sexual Activity   Alcohol use: Yes    Alcohol/week: 10.0 standard drinks of alcohol    Types: 10 Glasses of wine per week    Comment: rae   Drug use: No   Sexual activity: Not on file  Other Topics Concern   Not on file  Social History Narrative   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   Regular exercise -  Only ocass now   Hh of 1    etoh 1-2 per week    Daughter murdered in her house   Social Determinants of Health   Financial Resource Strain: Low Risk  (10/31/2022)   Overall Financial Resource Strain (CARDIA)    Difficulty of Paying Living Expenses: Not hard at all  Food Insecurity: No Food Insecurity (05/10/2023)   Hunger Vital Sign    Worried About Running Out of Food in the Last Year: Never true    Ran Out of Food in the Last Year: Never true  Transportation Needs: No Transportation Needs (05/10/2023)   PRAPARE - Administrator, Civil Service (Medical): No    Lack of Transportation (Non-Medical): No  Physical Activity: Inactive (10/31/2022)   Exercise Vital Sign    Days of Exercise per Week: 0 days    Minutes of Exercise per Session: 0 min  Stress: No Stress Concern Present (10/31/2022)   [employer] of Occupational Health -  Occupational Stress Questionnaire    Feeling of Stress : Not at all  Social Connections: Moderately Integrated (10/31/2022)   Social Connection and Isolation Panel [NHANES]    Frequency of Communication with Friends and Family: More than three times a week    Frequency of Social Gatherings with Friends and Family: More than three times a week    Attends Religious Services: More than 4 times per year    Active Member of [employer] or Organizations: Yes    Attends Engineer, structural: More than 4 times per year    Marital Status: Never married    Outpatient Medications Prior to Visit  Medication Sig Dispense Refill   acetaminophen (TYLENOL) 500 MG tablet Take 500-1,000 mg by mouth every 6 (six) hours as needed for mild pain or headache. 2 tablets     No facility-administered medications prior to visit.     EXAM:  BP 116/60 (BP Location: Right Arm, Patient Position: Sitting, Cuff Size: Large)   Pulse 72   Temp 97.7 F (36.5 C) (Oral)   Ht 5' 1.5" (1.562 m)   Wt 188 lb 6.4 oz (85.5 kg)   SpO2 99%   BMI 35.02 kg/m   Body mass index is 35.02 kg/m.  GENERAL: vitals reviewed and  listed above, alert, oriented, appears well hydrated and in no acute distress HEENT: atraumatic, conjunctiva  clear, no obvious abnormalities on inspection of external nose and ears OP : no lesion edema or exudate  NECK: no obvious masses on inspection palpation  MS: moves all extremities without noticeable focal  abnormality PSYCH: pleasant and cooperative, no obvious depression or anxiety  BP Readings from Last 3 Encounters:  08/29/23 116/60  08/18/23 (!) 166/86  06/14/23 110/66   Lab Results  Component Value Date   COLORU yellow 08/29/2023   CLARITYU Clear 08/29/2023   GLUCOSEUR Negative 08/29/2023   BILIRUBINUR Negative 08/29/2023   KETONESU Negative 08/29/2023   SPECGRAV 1.015 08/29/2023   RBCUR Negative 08/29/2023   PHUR 7.0 08/29/2023   PROTEINUR Negative 08/29/2023   UROBILINOGEN 0.2 08/29/2023   LEUKOCYTESUR Small (1+) (A) 08/29/2023     ASSESSMENT AND PLAN:  Discussed the following assessment and plan:  Pyuria - Plan: POC Urinalysis Dipstick, Urinalysis, Routine w reflex microscopic  S/P hip replacement, right Ua screen is improved and nosx  but discordant info lab . So finish antibiotic.  Plan fu ua with micro in a few weeks off medication at [facility] lab to ensure freshness of urine spec . And if no sx and will  just follow.  Disc that pos leuk could be external   to bladder. SHe currently reports  no Gu sx .  .  -Patient advised to return or notify health care team  if  new concerns arise.  Patient Instructions  To be sure.... finish antibiotic .    When you are  off the antibiotic at  least 1-2 weeks  get urine tests at the [facility] lab   Don't need appt  but would like a fresh specimen clean catch  to confirm all is well.  [address]  is the [employer] in in basement floor ( take Engineer, structural  )  andy time they are open .     Neta Mends. Ranard Harte M.D.

## 2023-08-29 NOTE — Patient Instructions (Addendum)
To be sure.... finish antibiotic .    When you are  off the antibiotic at  least 1-2 weeks  get urine tests at the [facility] lab   Don't need appt  but would like a fresh specimen clean catch  to confirm all is well.  [address]  is the [employer] in in basement floor ( take Engineer, structural  )  andy time they are open .

## 2023-08-30 NOTE — Telephone Encounter (Signed)
Had ov

## 2023-09-01 ENCOUNTER — Encounter: Payer: Self-pay | Admitting: Internal Medicine

## 2023-09-14 ENCOUNTER — Encounter: Payer: Self-pay | Admitting: Internal Medicine

## 2023-09-14 ENCOUNTER — Other Ambulatory Visit (INDEPENDENT_AMBULATORY_CARE_PROVIDER_SITE_OTHER): Payer: Medicare PPO

## 2023-09-14 DIAGNOSIS — R8281 Pyuria: Secondary | ICD-10-CM | POA: Diagnosis not present

## 2023-09-14 LAB — URINALYSIS, ROUTINE W REFLEX MICROSCOPIC
Bilirubin Urine: NEGATIVE
Ketones, ur: NEGATIVE
Nitrite: NEGATIVE
Specific Gravity, Urine: 1.01 (ref 1.000–1.030)
Total Protein, Urine: NEGATIVE
Urine Glucose: NEGATIVE
Urobilinogen, UA: 0.2 (ref 0.0–1.0)
pH: 6 (ref 5.0–8.0)

## 2023-09-15 ENCOUNTER — Telehealth: Payer: Self-pay | Admitting: Family

## 2023-09-15 ENCOUNTER — Other Ambulatory Visit: Payer: Medicare PPO

## 2023-09-15 ENCOUNTER — Other Ambulatory Visit: Payer: Self-pay

## 2023-09-15 DIAGNOSIS — R8281 Pyuria: Secondary | ICD-10-CM | POA: Diagnosis not present

## 2023-09-15 NOTE — Telephone Encounter (Signed)
Pt just spoke to CMA and CMA reviewed Pt's labs with her.   Pt called back (very high strung) and says she is very confused and has many questions: 1) Does she have a UTI? 2) Does she have a bladder infection? 3) What is the difference? 4) Will she get a prescription? 5) Why is MD only working 2 or 3 days a week, and is never available for her? 6) Pt is requesting that Dr. Caryl Never call her back directly.  Again, I spoke to CMA who assured that she just went over Pt's labs with her and explained everything to her already. Pt is adamant that she wants a call back from Dr. Caryl Never.

## 2023-09-15 NOTE — Telephone Encounter (Signed)
Pt states she came in for her urine analysis today and would like a call back from provider. She requested a call on Monday.

## 2023-09-15 NOTE — Telephone Encounter (Signed)
Spoke to patient and inform her of message below.

## 2023-09-15 NOTE — Telephone Encounter (Signed)
Pt calling, says she is very concerned and would like a call. Says she saw Burchette in the past and wondering if he can look into in Panosh's absence

## 2023-09-15 NOTE — Telephone Encounter (Signed)
Spoke to patient and went over provider's response.   Pt okay to give urine sample today.   Scheduled a lab appt today. Pt is aware lab is close at 3:30pm.   Pt is aware we might not get result in by today. Pt states no new sx, still feel pressure. Advise to head to urgent care if feels worse.

## 2023-09-15 NOTE — Telephone Encounter (Signed)
Attempted to reach pt. Left a detail message that her concern was forwarded to covering provider, Worthy Rancher, NP.  To give [place] a call back if have questions. States call was made from [facility]

## 2023-09-16 LAB — URINE CULTURE
MICRO NUMBER:: 15495434
Result:: NO GROWTH
SPECIMEN QUALITY:: ADEQUATE

## 2023-09-18 NOTE — Progress Notes (Signed)
Urinalysis looks fine  microscopic is normal

## 2023-09-20 NOTE — Progress Notes (Signed)
Chief Complaint  Patient presents with   Follow-up    On lab result.    Weight Loss    Pt would like to discuss about getting prescription to help with losing weight.     HPI: Samantha [name] 77 y.o. come in for     concerns about urine tests results . No falling. After hip surgery . My chart says the micro number is high and  chart shows such   . Says the  urine is tsill abnormal and very concerned that kidney bladder may protend serious disease.  Currently has baseling frequency  post op had suprapelvic sx  ( saw Dr banks and at that time had pyuria and hematouria and was rx with amox  but cx was NG ) urine almost clear after buut concern. No sx of vaginal issues .   All of this seemed to ocurr after hip surgery   Hard to lose weight of the 10 # gained after surgery.  Was told could be pill for that   but not interested in injections .  No hx db   Sees cards for lbbb and aortic root dilatation .  No current sx?  Q rsv vaccine    ROS: See pertinent positives and negatives per HPI.  Past Medical History:  Diagnosis Date   Aortic root dilation (HCC)    Arthritis    Colonic polyp    Left bundle branch block (LBBB)     Family History  Problem Relation Age of Onset   Cancer Sister        colon - 50   Heart attack Mother        37   Hypertension Mother    Other Daughter        murdered    Social History   Socioeconomic History   Marital status: Divorced    Spouse name: Not on file   Number of children: Not on file   Years of education: Not on file   Highest education level: Not on file  Occupational History   Not on file  Tobacco Use   Smoking status: Never   Smokeless tobacco: Never  Vaping Use   Vaping status: Never Used  Substance and Sexual Activity   Alcohol use: Yes    Alcohol/week: 10.0 standard drinks of alcohol    Types: 10 Glasses of wine per week    Comment: rae   Drug use: No   Sexual activity: Not on file  Other Topics Concern   Not on file   Social History Narrative   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   Regular exercise -  Only ocass now   Hh of 1    etoh 1-2 per week   Daughter murdered in her house   Social Determinants of Health   Financial Resource Strain: Low Risk  (10/31/2022)   Overall Financial Resource Strain (CARDIA)    Difficulty of Paying Living Expenses: Not hard at all  Food Insecurity: No Food Insecurity (05/10/2023)   Hunger Vital Sign    Worried About Running Out of Food in the Last Year: Never true    Ran Out of Food in the Last Year: Never true  Transportation Needs: No Transportation Needs (05/10/2023)   PRAPARE - Administrator, Civil Service (Medical): No    Lack of Transportation (Non-Medical): No  Physical Activity: Inactive (10/31/2022)   Exercise Vital Sign  Days of Exercise per Week: 0 days    Minutes of Exercise per Session: 0 min  Stress: No Stress Concern Present (10/31/2022)   [employer] of Occupational Health - Occupational Stress Questionnaire    Feeling of Stress : Not at all  Social Connections: Moderately Integrated (10/31/2022)   Social Connection and Isolation Panel [NHANES]    Frequency of Communication with Friends and Family: More than three times a week    Frequency of Social Gatherings with Friends and Family: More than three times a week    Attends Religious Services: More than 4 times per year    Active Member of [employer] or Organizations: Yes    Attends Engineer, structural: More than 4 times per year    Marital Status: Never married    Outpatient Medications Prior to Visit  Medication Sig Dispense Refill   acetaminophen (TYLENOL) 500 MG tablet Take 500-1,000 mg by mouth every 6 (six) hours as needed for mild pain or headache. 2 tablets     No facility-administered medications prior to visit.     EXAM:  BP 126/70 (BP Location: Right Arm, Patient Position: Sitting, Cuff Size: Large)   Pulse 74   Temp  97.9 F (36.6 C) (Oral)   Ht 5' 1.5" (1.562 m)   Wt 186 lb 6.4 oz (84.6 kg)   SpO2 96%   BMI 34.65 kg/m   Body mass index is 34.65 kg/m.  GENERAL: vitals reviewed and listed above, alert, oriented, appears well hydrated and in no acute distress  anxious  about  urine results noted on my chart  HEENT: atraumatic, conjunctiva  clear, no obvious abnormalities on inspection of external nose and earssome decrease hearing   Gait   independent  CV: HRRR, no clubbing cyanosis or  peripheral edema nl cap refill  MS: moves all extremities without noticeable focal  abnormality Review of  her my chart info and urine results  on system Lab Results  Component Value Date   WBC 17.0 (H) 05/11/2023   HGB 11.4 (L) 05/11/2023   HCT 35.2 (L) 05/11/2023   PLT 282 05/11/2023   GLUCOSE 154 (H) 05/11/2023   CHOL 205 (H) 02/06/2023   TRIG 194 (H) 02/06/2023   HDL 75 02/06/2023   LDLDIRECT 136.0 05/15/2018   LDLCALC 97 02/06/2023   ALT 20 11/08/2021   AST 24 11/08/2021   NA 129 (L) 05/11/2023   K 3.4 (L) 05/11/2023   CL 97 (L) 05/11/2023   CREATININE 0.47 05/11/2023   BUN 10 05/11/2023   CO2 23 05/11/2023   TSH 1.22 11/08/2021   HGBA1C 4.9 03/12/2013   BP Readings from Last 3 Encounters:  09/21/23 126/70  08/29/23 116/60  08/18/23 (!) 166/86    ASSESSMENT AND PLAN:  Discussed the following assessment and plan:  Lower urinary tract symptoms (LUTS) ? - see text no vag sx but did not do exam  Abnormal urine finding - recent microscopic  is normal   none done in past and rx for uti with NG culture  x 2  S/P hip replacement, right  Weight gain [name] is Very concerned that she could have kidney bladder disease  of import with  discrepant ua ds and culture test.  The urinalysis micro is reassuring  and ng cultures   noted but   advise we get more information and get urology consult.   Uncertain if sx are involved as she never had a uti before  . But has urinary  frequency .  Informed  that DS colorimetric tests are more for screening and not difnitive   but micro is  usually the best for help to detect renal disease etc. Not reassured by this info . Doubt kidney disease   but agree recheck labs  to confirm .    And get urology consult  prefers female The micro number she reports  on the urine culture reports as  of concern  this is not a colony count and thought was a non clinical number  but will have to investigate what this number describes.   Concern about weight   may not be candidate although may be helpful  she states "can't give herself injection"   Will get updated labs and share with cards to see if fits criteria for cv risk reduction indication  interested in weight loss drugs  since surgery hard to  get weight back off . Will go back to poss weight watchers that helped in past.  -Patient advised to return or notify health care team  if  new concerns arise.  Patient Instructions  Will order   urine and blood tests at [address]  [facility] .  Orders will be placed in system  And get any time. And will do a urology referral  to answer questions.  And then plan follow up.    Neta Mends. Rakayla Ricklefs M.D.

## 2023-09-21 ENCOUNTER — Other Ambulatory Visit: Payer: Self-pay | Admitting: Internal Medicine

## 2023-09-21 ENCOUNTER — Ambulatory Visit: Payer: Medicare PPO | Admitting: Internal Medicine

## 2023-09-21 ENCOUNTER — Encounter: Payer: Self-pay | Admitting: Internal Medicine

## 2023-09-21 VITALS — BP 126/70 | HR 74 | Temp 97.9°F | Ht 61.5 in | Wt 186.4 lb

## 2023-09-21 DIAGNOSIS — R399 Unspecified symptoms and signs involving the genitourinary system: Secondary | ICD-10-CM

## 2023-09-21 DIAGNOSIS — Z96641 Presence of right artificial hip joint: Secondary | ICD-10-CM

## 2023-09-21 DIAGNOSIS — R8290 Unspecified abnormal findings in urine: Secondary | ICD-10-CM

## 2023-09-21 DIAGNOSIS — R635 Abnormal weight gain: Secondary | ICD-10-CM

## 2023-09-21 NOTE — Patient Instructions (Signed)
Will order   urine and blood tests at [address]  [facility] .  Orders will be placed in system  And get any time. And will do a urology referral  to answer questions.  And then plan follow up.

## 2023-09-21 NOTE — Progress Notes (Unsigned)
Future labs for [facility] to be done  see ov notes

## 2023-09-22 ENCOUNTER — Other Ambulatory Visit (INDEPENDENT_AMBULATORY_CARE_PROVIDER_SITE_OTHER): Payer: Medicare PPO

## 2023-09-22 DIAGNOSIS — Z96641 Presence of right artificial hip joint: Secondary | ICD-10-CM

## 2023-09-22 DIAGNOSIS — R399 Unspecified symptoms and signs involving the genitourinary system: Secondary | ICD-10-CM

## 2023-09-22 DIAGNOSIS — R635 Abnormal weight gain: Secondary | ICD-10-CM

## 2023-09-22 DIAGNOSIS — R8290 Unspecified abnormal findings in urine: Secondary | ICD-10-CM

## 2023-09-22 LAB — CBC WITH DIFFERENTIAL/PLATELET
Basophils Absolute: 0.1 10*3/uL (ref 0.0–0.1)
Basophils Relative: 0.8 % (ref 0.0–3.0)
Eosinophils Absolute: 0.1 10*3/uL (ref 0.0–0.7)
Eosinophils Relative: 1.3 % (ref 0.0–5.0)
HCT: 38.4 % (ref 36.0–46.0)
Hemoglobin: 12.4 g/dL (ref 12.0–15.0)
Lymphocytes Relative: 25.6 % (ref 12.0–46.0)
Lymphs Abs: 2.8 10*3/uL (ref 0.7–4.0)
MCHC: 32.4 g/dL (ref 30.0–36.0)
MCV: 92.3 fL (ref 78.0–100.0)
Monocytes Absolute: 0.6 10*3/uL (ref 0.1–1.0)
Monocytes Relative: 5.5 % (ref 3.0–12.0)
Neutro Abs: 7.3 10*3/uL (ref 1.4–7.7)
Neutrophils Relative %: 66.8 % (ref 43.0–77.0)
Platelets: 325 10*3/uL (ref 150.0–400.0)
RBC: 4.16 Mil/uL (ref 3.87–5.11)
RDW: 13.7 % (ref 11.5–15.5)
WBC: 10.9 10*3/uL — ABNORMAL HIGH (ref 4.0–10.5)

## 2023-09-22 LAB — BASIC METABOLIC PANEL
BUN: 11 mg/dL (ref 6–23)
CO2: 29 meq/L (ref 19–32)
Calcium: 9.1 mg/dL (ref 8.4–10.5)
Chloride: 105 meq/L (ref 96–112)
Creatinine, Ser: 0.61 mg/dL (ref 0.40–1.20)
GFR: 86.54 mL/min (ref >60.00)
Glucose, Bld: 91 mg/dL (ref 70–99)
Potassium: 4.1 meq/L (ref 3.5–5.1)
Sodium: 141 meq/L (ref 135–145)

## 2023-09-22 LAB — MICROALBUMIN / CREATININE URINE RATIO
Creatinine,U: 81.3 mg/dL
Microalb Creat Ratio: 0.9 mg/g (ref 0.0–30.0)
Microalb, Ur: 0.8 mg/dL (ref 0.0–1.9)

## 2023-09-22 LAB — HEPATIC FUNCTION PANEL
ALT: 9 U/L (ref 0–35)
AST: 14 U/L (ref 0–37)
Albumin: 3.8 g/dL (ref 3.5–5.2)
Alkaline Phosphatase: 88 U/L (ref 39–117)
Bilirubin, Direct: 0.1 mg/dL (ref 0.0–0.3)
Total Bilirubin: 0.9 mg/dL (ref 0.2–1.2)
Total Protein: 7 g/dL (ref 6.0–8.3)

## 2023-09-22 LAB — URINALYSIS, MICROSCOPIC ONLY

## 2023-09-22 LAB — HEMOGLOBIN A1C: Hgb A1c MFr Bld: 5.4 % (ref 4.6–6.5)

## 2023-09-24 LAB — URINALYSIS W MICROSCOPIC + REFLEX CULTURE
Bacteria, UA: NONE SEEN /HPF
Bilirubin Urine: NEGATIVE
Glucose, UA: NEGATIVE
Hgb urine dipstick: NEGATIVE
Hyaline Cast: NONE SEEN /[LPF]
Ketones, ur: NEGATIVE
Nitrites, Initial: NEGATIVE
Protein, ur: NEGATIVE
Specific Gravity, Urine: 1.019 (ref 1.001–1.035)
WBC, UA: NONE SEEN /[HPF] (ref 0–5)
pH: >=8.5 — AB (ref 5.0–8.0)

## 2023-09-24 LAB — URINE CULTURE
MICRO NUMBER:: 15527140
SPECIMEN QUALITY:: ADEQUATE

## 2023-09-24 LAB — CULTURE INDICATED

## 2023-09-25 ENCOUNTER — Encounter: Payer: Self-pay | Admitting: Internal Medicine

## 2023-09-25 NOTE — Progress Notes (Signed)
Results looking better  and reassuring .  Anemia resolved  wbc Upper  limits normal but nl type cells. Kidney function is normal and no elevated protein in urine  There is no documented infection in  urine ( see note ; superficial bacteria common in female  collection).  No  evidence of  kidney  disease.  I have ordered urology consult about your concerns.  Also  in regard to the "micro number" ( this is NOT a colony count)  I have asked other providers and they  also are not aware that  this is a clinical number (and may be an administrative processing number  ) However I plan on getting back with  lab director to define  clarity and find  out why a graph is shown  on My Chart .

## 2023-09-26 ENCOUNTER — Encounter: Payer: Self-pay | Admitting: Internal Medicine

## 2023-09-26 ENCOUNTER — Telehealth: Payer: Medicare PPO | Admitting: Internal Medicine

## 2023-09-26 DIAGNOSIS — Z96641 Presence of right artificial hip joint: Secondary | ICD-10-CM

## 2023-09-26 DIAGNOSIS — R399 Unspecified symptoms and signs involving the genitourinary system: Secondary | ICD-10-CM | POA: Diagnosis not present

## 2023-09-26 NOTE — Progress Notes (Signed)
Virtual Visit via Video Note  I connected with Samantha [name] on 09/26/23 at  9:30 AM EDT by a video enabled telemedicine application and verified that I am speaking with the correct person using two identifiers. Location patient: home Location provider:work office Persons participating in the virtual visit: patient, provider   Patient aware  of the limitations of evaluation and management by telemedicine and  availability of in person appointments. and agreed to proceed.   HPI: Samantha [name] presents for video visit fu lab results   no new sx  Has ?s about  results  Pressure near hip bladder no different not sure if from hip or other  See previous notes  ROS: See pertinent positives and negatives per HPI.  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   Procedure: TOTAL HIP ARTHROPLASTY ANTERIOR APPROACH;  Surgeon: Ollen Gross, MD;  Location: [facility];  Service: Orthopedics;  Laterality: Right;    Family History  Problem Relation Age of Onset   Cancer Sister        colon - 85   Heart attack Mother        71   Hypertension Mother    Other Daughter        murdered    Social History   Tobacco Use   Smoking status: Never   Smokeless tobacco: Never  Vaping Use   Vaping status: Never Used  Substance Use Topics   Alcohol use: Yes    Alcohol/week: 10.0 standard drinks of alcohol    Types: 10 Glasses of wine per week    Comment: rae   Drug use: No      Current Outpatient Medications:    acetaminophen (TYLENOL) 500 MG tablet, Take 500-1,000 mg by mouth every 6 (six) hours as needed for mild pain or headache. 2 tablets, Disp: , Rfl:   EXAM: BP Readings from Last 3 Encounters:  09/21/23 126/70  08/29/23 116/60  08/18/23 (!) 166/86     VITALS per patient if applicable:  GENERAL: alert, oriented, appears well and in no acute distress  HEENT: atraumatic, conjunttiva clear, no obvious abnormalities on inspection of external nose and ears  NECK: normal movements of the head and neck  LUNGS: on inspection no signs of respiratory distress, breathing rate appears normal, no obvious gross SOB, gasping or wheezing   PSYCH/NEURO: pleasant and cooperative, no obvious depression or anxiety, speech and thought processing grossly intact Lab Results  Component Value Date   WBC 10.9 (H) 09/22/2023   HGB 12.4 09/22/2023   HCT 38.4 09/22/2023   PLT 325.0 09/22/2023   GLUCOSE 91 09/22/2023   CHOL 205 (H) 02/06/2023   TRIG 194 (H) 02/06/2023   HDL 75 02/06/2023   LDLDIRECT 136.0 05/15/2018   LDLCALC 97 02/06/2023   ALT 9 09/22/2023   AST 14 09/22/2023   NA 141 09/22/2023   K 4.1 09/22/2023   CL 105 09/22/2023   CREATININE 0.61 09/22/2023   BUN 11 09/22/2023   CO2 29 09/22/2023   TSH 1.22 11/08/2021   HGBA1C 5.4 09/22/2023   MICROALBUR 0.8 09/22/2023   Labs u/a and micro reviewed  u cx shoes external bacteria no pathogens and ds till registers  as trac h g and trc wbc  ASSESSMENT AND PLAN:  Discussed the following assessment and plan:    ICD-10-CM   1. Lower urinary tract symptoms (LUTS) ?  R39.9     2. S/P hip replacement, right  Z96.641      I think the micro number is a routing number and not certain why shows up as a graph on my chart causing concern and ?s . Will continue efforts to  clarify.   Proceed with urology consult for sx since hip surgery and underlying  urinary frequency predating  ( most likely had a catheter during surgery)  She probably did have a uti  but NG No id  on initial culture was discrepant  Is bette reassured today .  No evidence of kidney disease.  Counseled.   Expectant management and discussion of plan and treatment with opportunity to ask questions and all were answered. The  patient agreed with the plan and demonstrated an understanding of the instructions.   Advised to call back or seek an in-person evaluation if worsening  or having  further concerns  in interim. Return for as indicated urology consult pending.    Berniece Andreas, MD

## 2023-09-29 NOTE — Telephone Encounter (Signed)
Spoke to pt and inform of message below.   Pt reports she call [facility] and spoke to [facility] about her concerns on micro number. She states Debbie didn't tell her anything bc she is not a provider. Advise pt, I can call lab and ask them about it then call her back. Verbalized understanding.    Pt brought up about a swollen lymph node under right arm. She noticed it 3 days ago. Pt states she will have a mammogram tomorrow and wonder if she needs an order from provider on ultrasound for her swollen lymph node. Advise pt she would need an examination from provider first then provider can place order in if needed. Verbalized understanding.   Schedule pt an appt with Dr. Clent Ridges on Tuesday 9/8.

## 2023-09-30 DIAGNOSIS — Z1231 Encounter for screening mammogram for malignant neoplasm of breast: Secondary | ICD-10-CM | POA: Diagnosis not present

## 2023-09-30 LAB — HM MAMMOGRAPHY

## 2023-10-03 ENCOUNTER — Encounter: Payer: Self-pay | Admitting: Family Medicine

## 2023-10-03 ENCOUNTER — Ambulatory Visit: Payer: Medicare PPO | Admitting: Family Medicine

## 2023-10-03 VITALS — BP 118/70 | HR 82 | Temp 98.1°F | Wt 187.0 lb

## 2023-10-03 DIAGNOSIS — L02421 Furuncle of right axilla: Secondary | ICD-10-CM | POA: Diagnosis not present

## 2023-10-03 MED ORDER — DOXYCYCLINE HYCLATE 100 MG PO TABS: 100.0000 mg | ORAL_TABLET | Freq: Two times a day (BID) | ORAL | 0 refills | Status: DC

## 2023-10-03 NOTE — Progress Notes (Signed)
   Subjective:    Patient ID: Samantha [name], female    DOB: Apr 06, 1946, 77 y.o.   MRN: [id number]  HPI Here for a painful lump in the right armpit that came up 7 days ago. She feels fine in general. She had a mammogram on 09-30-23 which was normal.    Review of Systems  Constitutional: Negative.   Respiratory: Negative.    Cardiovascular: Negative.        Objective:   Physical Exam Constitutional:      Appearance: Normal appearance.  Cardiovascular:     Rate and Rhythm: Normal rate and regular rhythm.     Pulses: Normal pulses.     Heart sounds: Normal heart sounds.  Pulmonary:     Effort: Pulmonary effort is normal.     Breath sounds: Normal breath sounds.  Skin:    Comments: There is a tender boil in the right axilla   Neurological:     Mental Status: She is alert.           Assessment & Plan:  Boil, treat with hot compresses and 10 days of Doxycycline.  Gershon Crane, MD

## 2023-10-04 ENCOUNTER — Ambulatory Visit: Payer: Medicare PPO | Admitting: Family Medicine

## 2023-10-23 ENCOUNTER — Telehealth: Payer: Self-pay

## 2023-10-23 NOTE — Telephone Encounter (Signed)
-----   Message from Berniece Andreas sent at 09/26/2023 10:02 AM EDT ----- K please contact [facility] or have lab staff contact them and find out   what the "micro number" means on the urine culture reports . This finding is showing up on my chart as a result in a graph.

## 2023-10-23 NOTE — Telephone Encounter (Signed)
Contacted [employer] and spoke to [place].   She explains that micro number is for the lab system to use. It does not associated with UTI or pt's lab result.     Attempted to reach pt. Left a voicemail to call us back.

## 2023-10-23 NOTE — Telephone Encounter (Signed)
See phone encounter.

## 2023-10-24 ENCOUNTER — Encounter: Payer: Self-pay | Admitting: Cardiovascular Disease

## 2023-10-24 ENCOUNTER — Telehealth: Payer: Self-pay

## 2023-10-24 NOTE — Telephone Encounter (Signed)
Pt is returning the call.  

## 2023-10-24 NOTE — Telephone Encounter (Signed)
See phone encounter.

## 2023-10-24 NOTE — Telephone Encounter (Signed)
Attempted to reach pt. Will send mychart message. To give [place] a message if have any questions.

## 2023-10-24 NOTE — Telephone Encounter (Signed)
Attempted to reach pt to update her on "micro number".  Left a voicemail to call us back.

## 2023-11-03 ENCOUNTER — Encounter: Payer: Self-pay | Admitting: Cardiovascular Disease

## 2023-11-03 DIAGNOSIS — I2510 Atherosclerotic heart disease of native coronary artery without angina pectoris: Secondary | ICD-10-CM

## 2023-11-03 DIAGNOSIS — E782 Mixed hyperlipidemia: Secondary | ICD-10-CM

## 2023-11-03 DIAGNOSIS — R5383 Other fatigue: Secondary | ICD-10-CM

## 2023-11-07 ENCOUNTER — Ambulatory Visit (INDEPENDENT_AMBULATORY_CARE_PROVIDER_SITE_OTHER): Payer: Medicare PPO | Admitting: Family Medicine

## 2023-11-07 ENCOUNTER — Telehealth: Payer: Self-pay

## 2023-11-07 DIAGNOSIS — Z0000 Encounter for general adult medical examination without abnormal findings: Secondary | ICD-10-CM

## 2023-11-07 NOTE — Telephone Encounter (Signed)
Called and left a voice message informing patient that I was calling to go over questions for her Annual Wellness visit with Dr. Selena Batten and to please give our office a call back.

## 2023-11-07 NOTE — Progress Notes (Signed)
PATIENT CHECK-IN and HEALTH RISK ASSESSMENT QUESTIONNAIRE:  PLEASE CHECK FIRST UNDER ROOMING TAB, then QUESTIONNAIRE to see if patient completed online questions. If so use those to complete some of these question ahead of time.    -completed by phone/video for upcoming Medicare Preventive Visit  Pre-Visit Check-in: 1)Vitals (height, wt, BP, etc) - record in vitals section for visit on day of visit Request home vitals (wt, BP, etc.) and enter into vitals, THEN update Vital Signs SmartPhrase below at the top of the HPI. See below.  2)Review and Update Medications, Allergies PMH, Surgeries, Social history in Epic 3)Hospitalizations in the last year with date/reason?  Did have hip surgery in may, healed up well  4)Review and Update Care Team (patient's specialists) in Epic 5) Complete PHQ9 in Epic  6) Complete Fall Screening in Epic 7)Review all Health Maintenance Due and order under PCP if not done.  8)Medicare Wellness Questionnaire: Answer theses question about your habits: Do you drink alcohol? 1 drink a week Have you ever smoked? never Do you use an illicit drugs?no Do you exercises?  Walking, a few times per week for about 1 hour Reports diet is not great - has weakness for carbs, she likes carbs and eats too much.   Beverages:  water  Answer theses question about you: Can you perform most household chores? yes Do you find it hard to follow a conversation in a noisy room? Has hearing aide Do you often ask people to speak up or repeat themselves? no Do you feel that you have a problem with memory? no Do you balance your checkbook and or bank acounts? yes Do you feel safe at home? yes Last dentist visit? Goes on a regular bases - went a few weeks ago Do you need assistance with any of the following: Please note if so  - none needed  Driving?  Feeding yourself?  Getting from bed to chair?  Getting to the toilet?  Bathing or showering?  Dressing yourself?  Managing  money?  Climbing a flight of stairs  Preparing meals?  Do you have Advanced Directives in place (Living Will, Healthcare Power or Attorney)?  yes   Last eye Exam and location? Sees eye doctor yealry - she is unsure of name, [place]   Do you currently use prescribed or non-prescribed narcotic or opioid pain medications?no  Do you have a history or close family history of breast, ovarian, tubal or peritoneal cancer or a family member with BRCA (breast cancer susceptibility 1 and 2) gene mutations?     ----------------------------------------------------------------------------------------------------------------------------------------------------------------------------------------------------------------------  Because this visit was a virtual/telehealth visit, some criteria may be missing or patient reported. Any vitals not documented were not able to be obtained and vitals that have been documented are patient reported.    MEDICARE ANNUAL PREVENTIVE VISIT WITH PROVIDER: (Welcome to Medicare, initial annual wellness or annual wellness exam)  Virtual Visit via Video Note  I connected with [name] on 11/07/23 by a video enabled telemedicine application and verified that I am speaking with the correct person using two identifiers.  Location patient: home Location provider:work or home office Persons participating in the virtual visit: patient, provider  Concerns and/or follow up today: no new concerns.    See HM section in Epic for other details of completed HM.    ROS: negative for report of fevers, unintentional weight loss, vision changes, vision loss, hearing loss or change, chest pain, sob, hemoptysis, melena, hematochezia, hematuria, falls, bleeding or bruising, thoughts of  suicide or self harm, memory loss  Patient-completed extensive health risk assessment - reviewed and discussed with the patient: See Health Risk Assessment completed with patient prior to the  visit either above or in recent phone note. This was reviewed in detailed with the patient today and appropriate recommendations, orders and referrals were placed as needed per Summary below and patient instructions.   Review of Medical History: -PMH, PSH, Family History and current specialty and care providers reviewed and updated and listed below   Patient Care Team: Panosh, Neta Mends, MD as PCP - General Nahser, Deloris Ping, MD as PCP - Cardiology (Cardiology) Nahser, Deloris Ping, MD (Cardiology) Charna Elizabeth, MD as Attending Physician (Gastroenterology) Glyn Ade, PA-C as Physician Assistant (Dermatology)   Past Medical History:  Diagnosis Date   Aortic root dilation [facility])    Arthritis    Colonic polyp    Left bundle branch block (LBBB)     Past Surgical History:  Procedure Laterality Date   CHOLECYSTECTOMY     TONSILLECTOMY     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   Procedure: TOTAL HIP ARTHROPLASTY ANTERIOR APPROACH;  Surgeon: Ollen Gross, MD;  Location: [facility];  Service: Orthopedics;  Laterality: Right;    Social History   Socioeconomic History   Marital status: Divorced    Spouse name: Not on file   Number of children: Not on file   Years of education: Not on file   Highest education level: Not on file  Occupational History   Not on file  Tobacco Use   Smoking status: Never   Smokeless tobacco: Never  Vaping Use   Vaping status: Never Used  Substance and Sexual Activity   Alcohol use: Yes    Alcohol/week: 10.0 standard drinks of alcohol    Types: 10 Glasses of wine per week    Comment: rae   Drug use: No   Sexual activity: Not on file  Other Topics Concern   Not on file  Social History Narrative   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   Regular exercise -  Only ocass now   Hh of 1    etoh 1-2 per week   Daughter murdered in her house   Social Determinants of Health   Financial Resource Strain: Low Risk   (10/31/2022)   Overall Financial Resource Strain (CARDIA)    Difficulty of Paying Living Expenses: Not hard at all  Food Insecurity: No Food Insecurity (05/10/2023)   Hunger Vital Sign    Worried About Running Out of Food in the Last Year: Never true    Ran Out of Food in the Last Year: Never true  Transportation Needs: No Transportation Needs (05/10/2023)   PRAPARE - Administrator, Civil Service (Medical): No    Lack of Transportation (Non-Medical): No  Physical Activity: Inactive (10/31/2022)   Exercise Vital Sign    Days of Exercise per Week: 0 days    Minutes of Exercise per Session: 0 min  Stress: No Stress Concern Present (10/31/2022)   [employer] of Occupational Health - Occupational Stress Questionnaire    Feeling of Stress : Not at all  Social Connections: Moderately Integrated (10/31/2022)   Social Connection and Isolation Panel [NHANES]    Frequency of Communication with Friends and Family: More than three times a week    Frequency of Social Gatherings with Friends and Family: More than three times a week  Attends Religious Services: More than 4 times per year    Active Member of Clubs or Organizations: Yes    Attends Banker Meetings: More than 4 times per year    Marital Status: Never married  Intimate Partner Violence: Not At Risk (05/10/2023)   Humiliation, Afraid, Rape, and Kick questionnaire    Fear of Current or Ex-Partner: No    Emotionally Abused: No    Physically Abused: No    Sexually Abused: No    Family History  Problem Relation Age of Onset   Cancer Sister        colon - 27   Heart attack Mother        12   Hypertension Mother    Other Daughter        murdered    Current Outpatient Medications on File Prior to Visit  Medication Sig Dispense Refill   acetaminophen (TYLENOL) 500 MG tablet Take 500-1,000 mg by mouth every 6 (six) hours as needed for mild pain or headache. 2 tablets     doxycycline (VIBRA-TABS) 100 MG  tablet Take 1 tablet (100 mg total) by mouth 2 (two) times daily. 20 tablet 0   No current facility-administered medications on file prior to visit.    No Known Allergies     Physical Exam Vitals requested from patient and listed below if patient had equipment and was able to obtain at home for this virtual visit: There were no vitals filed for this visit. Estimated body mass index is 34.76 kg/m as calculated from the following:   Height as of 09/21/23: 5' 1.5" (1.562 m).   Weight as of 10/03/23: 187 lb (84.8 kg).  EKG (optional): deferred due to virtual visit  GENERAL: alert, oriented, no acute distress detected, full vision exam deferred due to pandemic and/or virtual encounter  HEENT: atraumatic, conjunttiva clear, no obvious abnormalities on inspection of external nose and ears  NECK: normal movements of the head and neck  LUNGS: on inspection no signs of respiratory distress, breathing rate appears normal, no obvious gross SOB, gasping or wheezing  CV: no obvious cyanosis  MS: moves all visible extremities without noticeable abnormality  PSYCH/NEURO: pleasant and cooperative, no obvious depression or anxiety, speech and thought processing grossly intact, Cognitive function grossly intact  Flowsheet Row Clinical Support from 10/09/2020 in [facility]  PHQ-9 Total Score 0           11/07/2023    5:44 PM 08/18/2023   11:07 AM 10/31/2022    2:19 PM 10/11/2021    3:13 PM 10/09/2020    1:22 PM  Depression screen PHQ 2/9  Decreased Interest 0 0 0 0 0  Down, Depressed, Hopeless 0 0 0 0 0  PHQ - 2 Score 0 0 0 0 0  Altered sleeping     0  Tired, decreased energy     0  Change in appetite     0  Feeling bad or failure about yourself      0  Trouble concentrating     0  Moving slowly or fidgety/restless     0  Suicidal thoughts     0  PHQ-9 Score     0  Difficult doing work/chores     Not difficult at all       10/11/2021    3:16 PM  10/31/2022    2:21 PM 04/26/2023    3:03 PM 08/18/2023   11:07 AM 11/07/2023  5:44 PM  Fall Risk  Falls in the past year? 0 0 0 0 0  Was there an injury with Fall? 0 0 0 0 0  Fall Risk Category Calculator 0 0 0 0 0  Fall Risk Category (Retired) Low Low     (RETIRED) Patient Fall Risk Level Low fall risk Low fall risk     Patient at Risk for Falls Due to  No Fall Risks No Fall Risks    Fall risk Follow up Falls evaluation completed Falls prevention discussed Falls evaluation completed Falls evaluation completed      SUMMARY AND PLAN:  Encounter for Medicare annual wellness exam    Discussed applicable health maintenance/preventive health measures and advised and referred or ordered per patient preferences: -she declined bone density testing -reports still needs to get 2nd does of shingles shot, advised to bring copy of these when she completes the series -discussed hep C screening Health Maintenance  Topic Date Due   Hepatitis C Screening  Never done   Zoster Vaccines- Shingrix (1 of 2) 10/20/1965   COVID-19 Vaccine (10 - 2023-24 season) 11/01/2023   DEXA SCAN  11/06/2029 (Originally 10/21/2011)   MAMMOGRAM  09/29/2024   Medicare Annual Wellness (AWV)  11/06/2024   DTaP/Tdap/Td (2 - Td or Tdap) 10/21/2026   Pneumonia Vaccine 49+ Years old  Completed   INFLUENZA VACCINE  Completed   HPV VACCINES  Aged Out   Colonoscopy  Discontinued    Education and counseling on the following was provided based on the above review of health and a plan/checklist for the patient, along with additional information discussed, was provided for the patient in the patient instructions :   -Providedsafe balance exercises that can be done at home to improve balance and discussed exercise guidelines for adults - see patient instructions.  -Advised and counseled on a healthy lifestyle - including the importance of a healthy diet, regular physical activity, social connections  -Reviewed patient's  current diet. Advised and counseled on a whole foods based healthy diet. A summary of a healthy diet was provided in the Patient Instructions.  -reviewed patient's current physical activity level and discussed exercise guidelines for adults. Discussed community resources and ideas for safe exercise at home to assist in meeting exercise guideline recommendations in a safe and healthy way.  -Advise yearly dental visits at minimum and regular eye exams -Advised and counseled on alcohol safe limits, risks  Follow up: see patient instructions     Patient Instructions  I really enjoyed getting to talk with you today! I am available on Tuesdays and Thursdays for virtual visits if you have any questions or concerns, or if I can be of any further assistance.   CHECKLIST FROM ANNUAL WELLNESS VISIT:  -Follow up (please call to schedule if not scheduled after visit):   -yearly for annual wellness visit with primary care office  Here is a list of your preventive care/health maintenance measures and the plan for each if any are due:  PLAN For any measures below that may be due:  -please bring record of shingles vaccines when done  Health Maintenance  Topic Date Due   Hepatitis C Screening  Never done   Zoster Vaccines- Shingrix (1 of 2) 10/20/1965   DEXA SCAN  Never done   COVID-19 Vaccine (10 - 2023-24 season) 10/28/2023   MAMMOGRAM  09/29/2024   Medicare Annual Wellness (AWV)  11/06/2024   DTaP/Tdap/Td (2 - Td or Tdap) 10/21/2026   Pneumonia Vaccine 65+  Years old  Completed   INFLUENZA VACCINE  Completed   HPV VACCINES  Aged Out   Colonoscopy  Discontinued    -See a dentist at least yearly  -Get your eyes checked and then per your eye specialist's recommendations  -Other issues addressed today:   -I have included below further information regarding a healthy whole foods based diet, physical activity guidelines for adults, stress management and opportunities for social connections. I  hope you find this information useful.   -----------------------------------------------------------------------------------------------------------------------------------------------------------------------------------------------------------------------------------------------------------  NUTRITION: -eat real food: lots of colorful vegetables (half the plate) and fruits -5-7 servings of vegetables and fruits per day (fresh or steamed is best), exp. 2 servings of vegetables with lunch and dinner and 2 servings of fruit per day. Berries and greens such as kale and collards are great choices.  -consume on a regular basis: whole grains (make sure first ingredient on label contains the word "whole"), fresh fruits, fish, nuts, seeds, healthy oils (such as olive oil, avocado oil, grape seed oil) -may eat small amounts of dairy and lean meat on occasion, but avoid processed meats such as ham, bacon, lunch meat, etc. -drink water -try to avoid fast food and pre-packaged foods, processed meat -most experts advise limiting sodium to < 2300mg  per day, should limit further is any chronic conditions such as high blood pressure, heart disease, diabetes, etc. The American Heart Association advised that < 1500mg  is is ideal -try to avoid foods that contain any ingredients with names you do not recognize  -try to avoid sugar/sweets (except for the natural sugar that occurs in fresh fruit) -try to avoid sweet drinks -try to avoid white rice, white bread, pasta (unless whole grain), white or yellow potatoes  EXERCISE GUIDELINES FOR ADULTS: -if you wish to increase your physical activity, do so gradually and with the approval of your doctor -STOP and seek medical care immediately if you have any chest pain, chest discomfort or trouble breathing when starting or increasing exercise  -move and stretch your body, legs, feet and arms when sitting for long periods -Physical activity guidelines for optimal health in  adults: -least 150 minutes per week of aerobic exercise (can talk, but not sing) once approved by your doctor, 20-30 minutes of sustained activity or two 10 minute episodes of sustained activity every day.  -resistance training at least 2 days per week if approved by your doctor -balance exercises 3+ days per week:   Stand somewhere where you have something sturdy to hold onto if you lose balance.    1) lift up on toes, start with 5x per day and work up to 20x   2) stand and lift on leg straight out to the side so that foot is a few inches of the floor, start with 5x each side and work up to 20x each side   3) stand on one foot, start with 5 seconds each side and work up to 20 seconds on each side  If you need ideas or help with getting more active:  -Silver sneakers https://tools.silversneakers.com  -Walk with a Doc: http://www.duncan-williams.com/  -try to include resistance (weight lifting/strength building) and balance exercises twice per week: or the following link for ideas: http://castillo-powell.com/  BuyDucts.dk  STRESS MANAGEMENT: -can try meditating, or just sitting quietly with deep breathing while intentionally relaxing all parts of your body for 5 minutes daily -if you need further help with stress, anxiety or depression please follow up with your primary doctor or contact the wonderful folks at [employer]  Behavioral Health: [phone]  SOCIAL CONNECTIONS: -options in [place] if you wish to engage in more social and exercise related activities:  -Silver sneakers https://tools.silversneakers.com  -Walk with a Doc: http://www.duncan-williams.com/  -Check out the [facility] Active Adults 50+ section on the [place] of [employer] (hiking clubs, book clubs, cards and games, chess, exercise classes, aquatic classes and much more) - see the website for  details: https://www.[place].gov/departments/parks-recreation/active-adults50  -YouTube has lots of exercise videos for different ages and abilities as well  -[facility] (a variety of indoor and outdoor inperson activities for adults). [phone]. [address]  -Virtual Online Classes (a variety of topics): see seniorplanet.org or call [phone]  -consider volunteering at a school, hospice center, church, senior center or elsewhere           Terressa Koyanagi, DO

## 2023-11-07 NOTE — Patient Instructions (Signed)
I really enjoyed getting to talk with you today! I am available on Tuesdays and Thursdays for virtual visits if you have any questions or concerns, or if I can be of any further assistance.   CHECKLIST FROM ANNUAL WELLNESS VISIT:  -Follow up (please call to schedule if not scheduled after visit):   -yearly for annual wellness visit with primary care office  Here is a list of your preventive care/health maintenance measures and the plan for each if any are due:  PLAN For any measures below that may be due:  -please bring record of shingles vaccines when done  Health Maintenance  Topic Date Due   Hepatitis C Screening  Never done   Zoster Vaccines- Shingrix (1 of 2) 10/20/1965   DEXA SCAN  Never done   COVID-19 Vaccine (10 - 2023-24 season) 10/28/2023   MAMMOGRAM  09/29/2024   Medicare Annual Wellness (AWV)  11/06/2024   DTaP/Tdap/Td (2 - Td or Tdap) 10/21/2026   Pneumonia Vaccine 27+ Years old  Completed   INFLUENZA VACCINE  Completed   HPV VACCINES  Aged Out   Colonoscopy  Discontinued    -See a dentist at least yearly  -Get your eyes checked and then per your eye specialist's recommendations  -Other issues addressed today:   -I have included below further information regarding a healthy whole foods based diet, physical activity guidelines for adults, stress management and opportunities for social connections. I hope you find this information useful.   -----------------------------------------------------------------------------------------------------------------------------------------------------------------------------------------------------------------------------------------------------------  NUTRITION: -eat real food: lots of colorful vegetables (half the plate) and fruits -5-7 servings of vegetables and fruits per day (fresh or steamed is best), exp. 2 servings of vegetables with lunch and dinner and 2 servings of fruit per day. Berries and greens such as kale and  collards are great choices.  -consume on a regular basis: whole grains (make sure first ingredient on label contains the word "whole"), fresh fruits, fish, nuts, seeds, healthy oils (such as olive oil, avocado oil, grape seed oil) -may eat small amounts of dairy and lean meat on occasion, but avoid processed meats such as ham, bacon, lunch meat, etc. -drink water -try to avoid fast food and pre-packaged foods, processed meat -most experts advise limiting sodium to < 2300mg  per day, should limit further is any chronic conditions such as high blood pressure, heart disease, diabetes, etc. The American Heart Association advised that < 1500mg  is is ideal -try to avoid foods that contain any ingredients with names you do not recognize  -try to avoid sugar/sweets (except for the natural sugar that occurs in fresh fruit) -try to avoid sweet drinks -try to avoid white rice, white bread, pasta (unless whole grain), white or yellow potatoes  EXERCISE GUIDELINES FOR ADULTS: -if you wish to increase your physical activity, do so gradually and with the approval of your doctor -STOP and seek medical care immediately if you have any chest pain, chest discomfort or trouble breathing when starting or increasing exercise  -move and stretch your body, legs, feet and arms when sitting for long periods -Physical activity guidelines for optimal health in adults: -least 150 minutes per week of aerobic exercise (can talk, but not sing) once approved by your doctor, 20-30 minutes of sustained activity or two 10 minute episodes of sustained activity every day.  -resistance training at least 2 days per week if approved by your doctor -balance exercises 3+ days per week:   Stand somewhere where you have something sturdy to hold onto if you  lose balance.    1) lift up on toes, start with 5x per day and work up to 20x   2) stand and lift on leg straight out to the side so that foot is a few inches of the floor, start with 5x  each side and work up to 20x each side   3) stand on one foot, start with 5 seconds each side and work up to 20 seconds on each side  If you need ideas or help with getting more active:  -Silver sneakers https://tools.silversneakers.com  -Walk with a Doc: http://www.duncan-williams.com/  -try to include resistance (weight lifting/strength building) and balance exercises twice per week: or the following link for ideas: http://castillo-powell.com/  BuyDucts.dk  STRESS MANAGEMENT: -can try meditating, or just sitting quietly with deep breathing while intentionally relaxing all parts of your body for 5 minutes daily -if you need further help with stress, anxiety or depression please follow up with your primary doctor or contact the wonderful folks at [facility]: [phone]  SOCIAL CONNECTIONS: -options in [place] if you wish to engage in more social and exercise related activities:  -Silver sneakers https://tools.silversneakers.com  -Walk with a Doc: http://www.duncan-williams.com/  -Check out the [facility] Active Adults 50+ section on the [place] [employer] (hiking clubs, book clubs, cards and games, chess, exercise classes, aquatic classes and much more) - see the website for details: https://www.[place]-.gov/departments/parks-recreation/active-adults50  -YouTube has lots of exercise videos for different ages and abilities as well  -[facility] (a variety of indoor and outdoor inperson activities for adults). [phone]. [address]  -Virtual Online Classes (a variety of topics): see seniorplanet.org or call [phone]  -consider volunteering at a school, hospice center, church, senior center or elsewhere

## 2023-11-09 DIAGNOSIS — Z96641 Presence of right artificial hip joint: Secondary | ICD-10-CM | POA: Diagnosis not present

## 2023-11-13 ENCOUNTER — Telehealth: Payer: Self-pay

## 2023-11-13 NOTE — Telephone Encounter (Signed)
Called re: PREP program referral, left voicemail requesting return call.  

## 2023-11-13 NOTE — Telephone Encounter (Signed)
She returned my call, explained PREP, would like to attend next class at [facility] starting 12/3, every T/Th 12-1:15; assessment visit scheduled for Monday 12/2 at 11:30

## 2023-11-27 NOTE — Progress Notes (Signed)
[facility] PREP Evaluation  Patient Details  Name: Samantha [name] MRN: [id number] Date of Birth: [birth year] Age: 77 y.o. PCP: Madelin Headings, MD  Vitals:   11/27/23 1157  BP: 122/76  Pulse: 76  SpO2: 98%  Weight: 185 lb 3.2 oz (84 kg)     [facility] Eval - 11/27/23 1100       [facility] "PREP" Location   [facility] "PREP" Location [name]      Referral    Referring Provider Nahser    Reason for referral Inactivity;Obesitity/Overweight    Program Start Date 11/28/23      Measurement   Waist Circumference 45 inches    Hip Circumference 49 inches    Body fat 48.4 percent      Information for Trainer   Goals --   Lose 10-15 pounds by end of program; establish consistent exercise routine to increasa stamina, decrease shortness of breath   Current Exercise walking    Orthopedic Concerns --   s/p R THR     Timed Up and Go (TUGS)   Timed Up and Go Low risk <9 seconds      Mobility and Daily Activities   I find it easy to walk up or down two or more flights of stairs. 3    I have no trouble taking out the trash. 2    I do housework such as vacuuming and dusting on my own without difficulty. 4    I can easily lift a gallon of milk (8lbs). 4    I can easily walk a mile. 3    I have no trouble reaching into high cupboards or reaching down to pick up something from the floor. 1    I do not have trouble doing out-door work such as Loss adjuster, chartered, raking leaves, or gardening. 3      Mobility and Daily Activities   I feel younger than my age. 1    I feel independent. 3    I feel energetic. 1    I live an active life.  3    I feel strong. 1    I feel healthy. 1    I feel active as other people my age. 1      How fit and strong are you.   Fit and Strong Total Score 31            Past Medical History:  Diagnosis Date   Aortic root dilation (HCC)    Arthritis    Colonic polyp    Left bundle branch block (LBBB)    Past Surgical History:  Procedure Laterality Date    CHOLECYSTECTOMY     TONSILLECTOMY     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   Procedure: TOTAL HIP ARTHROPLASTY ANTERIOR APPROACH;  Surgeon: Ollen Gross, MD;  Location: [facility];  Service: Orthopedics;  Laterality: Right;   Social History   Tobacco Use  Smoking Status Never  Smokeless Tobacco Never  To begin PREP at [facility] on 12/3, every T/Th 12-1:15  Takeysha Bonk B Miara Emminger 11/27/2023, 12:00 PM

## 2023-11-28 NOTE — Progress Notes (Signed)
[facility] PRE[facility] Weekly Session  Patient Details  Name: Samantha [name] MRN: [id number] Date of Birth: 25-Oct-1946 Age: 77 y.o. PCP: Madelin Headings, MD  There were no vitals filed for this visit.   [facility] Weekly seesion - 11/28/23 1300       [facility] "PREP" Location   [facility]PREP[facility] Location [name]      Weekly Session   Topic Discussed Goal setting and welcome to the program   Introductions, reivew of notebook, tour of facility, offered option to work out on cardio machines   Classes attended to date 1             Samantha [name] Samantha [name] 11/28/2023, 1:40 PM

## 2023-12-04 NOTE — Progress Notes (Unsigned)
Cardiology Office Note:    Date:  12/04/2023   ID:  Samantha [name], DOB [birth year], MRN [id number]  PCP:  Madelin Headings, MD   [facility] Providers Cardiologist:  Kristeen Miss, MD {   Referring MD: Madelin Headings, MD   No chief complaint on file.   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 She has been having exertional intrascapular pain for the past several months. He typically hurts if she climbs several flights of stairs or walks up a hill.  There is no associated dyspnea or syncope.  I've seen her in the remote past. We performed a stress test results which cannot be found today but she reports that they were normal.   Feb. 12, 2024 MELLANIE [name] is a 77 y.o. female with a hx of chest pain  She was last seen 11 years ago    Has had recent CP Her mother died of an MI  She had CP while eating chicken .  Thought it may have been indigestion   She has been seen by Dr. Tereso Newcomer recently  Was found to have a LBBB .  Echo and cor CTA were ordered.   Coronary CT angiogram was performed.  This revealed very mildly dilated ascending thoracic aorta measuring 4.0 cm.  They recommended annual follow-up for this.  ( She is very concerned about this )   Advised her to avoid flouroquinolones due to possible worsening of aortic aneurisms .    Coronaries:  CAC  score is 0  RCA :   mild soft plaque LM : normal  LAD :  normal  LCx :   normal   Echo - scheduled    She walks regularly ,   only has DOE if she is walking up a hill or if she is pushing the pace .  Walks 2 miles several times a week . No CP with walking   Wt is 188 .   LDL was 102  in Nov. 2022  Will repeat her LDL and LP(A)    March 08, 2023 [name] is seen for follow up of her LBBB  Cor CTA 01/27/23  shows minimal CAD in the RCA.  CAC is 0.  Mild dilatation of the ascending aorta at 4.0 cm.   Plan is for aortic CTA in 1 year    Echo:  normal LVEF of 55-60%.  Grade I Dd  She was prescribed valsartan 40 mg a day but she did not start it yet    LP(a) is 12 LDL 02/06/23 is 97   June 14, 2023  She has had her hip replacement   Has LBBB She did not start taking her valsartan  (never tried it , will take off list )  She found an EKG from October, 1999 which reveals normal QRS duration.  Her left bundle branch clearly has developed since that time. Mildly dilated asc. Aorta 4.0 cm   Echocardiogram from February, 2024 shows normal left ventricular systolic function with an LVEF of 55 to 60%.  She has grade 1 (mild) diastolic dysfunction.  RV function and size are normal. Mild aortic insufficiency.  Coronary CT angiogram from January 26, 2023 reveals mild, nonobstructive coronary artery disease.  Coronary calcium score is 0. Ascending aorta measured 4.0 cm.    Dec. 10, 2024  Samantha [name] is seen for follow up  Has LBBB Chronic  diastolic CHF Mildly dilated ascending aorta      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   Procedure: TOTAL HIP ARTHROPLASTY ANTERIOR APPROACH;  Surgeon: Ollen Gross, MD;  Location: [facility];  Service: Orthopedics;  Laterality: Right;    Current Medications: No outpatient medications have been marked as taking for the 12/05/23 encounter (Appointment) with Verniece Encarnacion, Deloris Ping, MD.     Allergies:   Patient has no known allergies.   Social History   Socioeconomic History   Marital status: Divorced    Spouse name: Not on file   Number of children: Not on file   Years of education: Not on file   Highest education level: Not on file  Occupational History   Not on file  Tobacco Use   Smoking status: Never   Smokeless tobacco: Never  Vaping Use    Vaping status: Never Used  Substance and Sexual Activity   Alcohol use: Yes    Alcohol/week: 10.0 standard drinks of alcohol    Types: 10 Glasses of wine per week    Comment: rae   Drug use: No   Sexual activity: Not on file  Other Topics Concern   Not on file  Social History Narrative   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   Regular exercise -  Only ocass now   Hh of 1    etoh 1-2 per week   Daughter murdered in her house   Social Determinants of Health   Financial Resource Strain: Low Risk  (10/31/2022)   Overall Financial Resource Strain (CARDIA)    Difficulty of Paying Living Expenses: Not hard at all  Food Insecurity: No Food Insecurity (05/10/2023)   Hunger Vital Sign    Worried About Running Out of Food in the Last Year: Never true    Ran Out of Food in the Last Year: Never true  Transportation Needs: No Transportation Needs (05/10/2023)   PRAPARE - Administrator, Civil Service (Medical): No    Lack of Transportation (Non-Medical): No  Physical Activity: Inactive (10/31/2022)   Exercise Vital Sign    Days of Exercise per Week: 0 days    Minutes of Exercise per Session: 0 min  Stress: No Stress Concern Present (10/31/2022)   [employer] of Occupational Health - Occupational Stress Questionnaire    Feeling of Stress : Not at all  Social Connections: Moderately Integrated (10/31/2022)   Social Connection and Isolation Panel [NHANES]    Frequency of Communication with Friends and Family: More than three times a week    Frequency of Social Gatherings with Friends and Family: More than three times a week    Attends Religious Services: More than 4 times per year    Active Member of [employer] or Organizations: Yes    Attends Engineer, structural: More than 4 times per year    Marital Status: Never married     Family History: The patient's family history includes Cancer in her sister; Heart attack in her mother;  Hypertension in her mother; Other in her daughter.  ROS:   Please see the history of present illness.  All other systems reviewed and are negative.  EKGs/Labs/Other Studies Reviewed:    The following studies were reviewed today:   EKG:      Recent Labs: 09/22/2023: ALT 9; BUN 11; Creatinine, Ser 0.61; Hemoglobin 12.4; Platelets 325.0; Potassium 4.1; Sodium 141  Recent Lipid Panel    Component Value Date/Time   CHOL 205 (H) 02/06/2023 1533   TRIG 194 (H) 02/06/2023 1533   HDL 75 02/06/2023 1533   CHOLHDL 2.7 02/06/2023 1533   CHOLHDL 3 11/08/2021 1157   VLDL 21.0 11/08/2021 1157   LDLCALC 97 02/06/2023 1533   LDLDIRECT 136.0 05/15/2018 1446     Risk Assessment/Calculations:        Physical Exam:     Physical Exam: There were no vitals taken for this visit.  No BP recorded.  {Refresh Note OR Click here to enter BP  :1}***    GEN:  Well nourished, well developed in no acute distress HEENT: Normal NECK: No JVD; No carotid bruits LYMPHATICS: No lymphadenopathy CARDIAC: RRR ***, no murmurs, rubs, gallops RESPIRATORY:  Clear to auscultation without rales, wheezing or rhonchi  ABDOMEN: Soft, non-tender, non-distended MUSCULOSKELETAL:  No edema; No deformity  SKIN: Warm and dry NEUROLOGIC:  Alert and oriented x 3    ASSESSMENT:    No diagnosis found.    PLAN:       LBBB  -coronary CT angiogram reveals only minor coronary artery irregularities.      2.  Dilated ascending aorta : CTA shows an ascending aorta of 4.0 cm.      3.  Mild CAD :    4.  Hyperlipidemia :     Medication Adjustments/Labs and Tests Ordered: Current medicines are reviewed at length with the patient today.  Concerns regarding medicines are outlined above.  No orders of the defined types were placed in this encounter.  No orders of the defined types were placed in this encounter.   There are no Patient Instructions on file for this visit.   Signed, Kristeen Miss, MD   12/04/2023 9:49 PM    [facility]

## 2023-12-05 ENCOUNTER — Ambulatory Visit: Payer: Medicare PPO | Attending: Cardiovascular Disease | Admitting: Cardiovascular Disease

## 2023-12-05 ENCOUNTER — Encounter: Payer: Self-pay | Admitting: Cardiovascular Disease

## 2023-12-05 VITALS — BP 124/80 | HR 82 | Ht 61.5 in | Wt 185.4 lb

## 2023-12-05 DIAGNOSIS — I447 Left bundle-branch block, unspecified: Secondary | ICD-10-CM | POA: Diagnosis not present

## 2023-12-05 NOTE — Patient Instructions (Signed)
Follow-Up: At [facility], you and your health needs are our priority.  As part of our continuing mission to provide you with exceptional heart care, we have created designated Provider Care Teams.  These Care Teams include your primary Cardiologist (physician) and Advanced Practice Providers (APPs -  Physician Assistants and Nurse Practitioners) who all work together to provide you with the care you need, when you need it.  Your next appointment:   1 year(s)  Provider:   Dietrich Pates, MD

## 2023-12-05 NOTE — Progress Notes (Signed)
[facility] PREP Weekly Session  Patient Details  Name: Samantha [name] MRN: [id number] Date of Birth: Dec 08, 1946 Age: 77 y.o. PCP: Madelin Headings, MD  Vitals:   12/05/23 1314  Weight: 185 lb (83.9 kg)     [facility] Weekly seesion - 12/05/23 1300       [facility] "PREP" Location   [facility] "PREP[facility] Location [name]      Weekly Session   Topic Discussed Importance of resistance training;Other ways to be active   CV activity goal: work up to 150 minutes cardio/week; resistance training goal: start at twice a week for 20-40 minutes, work up to 3 times a week   Minutes exercised this week 175 minutes    Classes attended to date 3             Samantha [name] Samantha [name] 12/05/2023, 1:16 PM

## 2023-12-06 ENCOUNTER — Encounter: Payer: Self-pay | Admitting: Cardiovascular Disease

## 2023-12-07 ENCOUNTER — Other Ambulatory Visit: Payer: Self-pay

## 2023-12-07 DIAGNOSIS — Z79899 Other long term (current) drug therapy: Secondary | ICD-10-CM

## 2023-12-12 NOTE — Progress Notes (Signed)
[facility] PREP Weekly Session  Patient Details  Name: LENNETTE PEACHER MRN: [id number] Date of Birth: September 25, 1946 Age: 77 y.o. PCP: Madelin Headings, MD  Vitals:   12/12/23 1330  Weight: 183 lb (83 kg)     [facility] Weekly seesion - 12/12/23 1300       [facility] "PREP" Location   [facility] "PREP[facility] Location [name]      Weekly Session   Topic Discussed Healthy eating tips   Foods to reduce, foods to increase; eat the rainbow of colors; introduce YUKA app   Minutes exercised this week 300 minutes    Classes attended to date 5             Giavonni Cizek B Uvaldo Rybacki 12/12/2023, 1:31 PM

## 2023-12-25 ENCOUNTER — Encounter: Payer: Self-pay | Admitting: Cardiovascular Disease

## 2023-12-25 ENCOUNTER — Ambulatory Visit (HOSPITAL_COMMUNITY): Payer: Medicare PPO

## 2023-12-26 NOTE — Progress Notes (Signed)
 [facility] PRE[facility] Weekly Session  Patient Details  Name: Samantha [name] MRN: [id number] Date of Birth: 1946-02-14 Age: 77 y.o. PCP: Charlett Apolinar POUR, MD  There were no vitals filed for this visit.   [facility] Weekly seesion - 12/26/23 1300       [facility] PREP Location   [facility] PREP Location [name]      Weekly Session   Topic Discussed Health habits   Sugar Demo   Minutes exercised this week 140 minutes    Classes attended to date 8             Samantha [name] 12/26/2023, 1:23 PM

## 2024-01-01 ENCOUNTER — Ambulatory Visit (HOSPITAL_COMMUNITY): Payer: Medicare PPO

## 2024-01-02 NOTE — Progress Notes (Signed)
 [facility] PRE[facility] Weekly Session  Patient Details  Name: MAYSEN BONSIGNORE MRN: [id number] Date of Birth: Dec 03, 1946 Age: 78 y.o. PCP: Charlett Apolinar POUR, MD  Vitals:   01/02/24 1318  Weight: 182 lb (82.6 kg)     [facility] Weekly seesion - 01/02/24 1300       [facility] PREP Location   [facility] PREP Location [name]      Weekly Session   Topic Discussed Restaurant Eating   Salt limit 1500-2300/day; salt demo   Classes attended to date 64             Sairah Knobloch B Colbey Wirtanen 01/02/2024, 1:19 PM

## 2024-01-05 ENCOUNTER — Encounter: Payer: Self-pay | Admitting: Cardiovascular Disease

## 2024-01-05 ENCOUNTER — Telehealth: Payer: Self-pay | Admitting: Cardiovascular Disease

## 2024-01-05 NOTE — Telephone Encounter (Signed)
 Patient called stating she got a call from her insurance company stating the form they sent wasn't filled out correctly by us  for the CT scan.  They advised her that they called us  as well.  Patient called us  to give us  a heads up.  She states she already had to r/s her CT Scan.

## 2024-01-05 NOTE — Telephone Encounter (Signed)
 Attempted to contact patient, left message that our pre-cert team is already working on an appeal. If any further questions to contact our office.

## 2024-01-09 NOTE — Progress Notes (Signed)
 [facility] PRE[facility] Weekly Session  Patient Details  Name: Samantha [name] MRN: [id number] Date of Birth: 07-15-1946 Age: 78 y.o. PCP: Charlett Apolinar POUR, MD  Vitals:   01/09/24 1305  Weight: 184 lb (83.5 kg)     [facility] Weekly seesion - 01/09/24 1300       [facility] PREP Location   [facility] PREP Location [name]      Weekly Session   Topic Discussed Stress management and problem solving   Importance of sleep 7-9 hrs/night; finger tip mudra breathwork   Classes attended to date 72             Frankye Schwegel B Eviana Sibilia 01/09/2024, 1:06 PM

## 2024-01-12 ENCOUNTER — Ambulatory Visit (HOSPITAL_COMMUNITY)
Admission: RE | Admit: 2024-01-12 | Discharge: 2024-01-12 | Disposition: A | Discharge status: Home / Self Care | Payer: Medicare PPO | Source: Ambulatory Visit | Attending: Cardiovascular Disease | Admitting: Cardiovascular Disease

## 2024-01-12 DIAGNOSIS — I447 Left bundle-branch block, unspecified: Secondary | ICD-10-CM | POA: Insufficient documentation

## 2024-01-12 DIAGNOSIS — E782 Mixed hyperlipidemia: Secondary | ICD-10-CM | POA: Diagnosis not present

## 2024-01-12 DIAGNOSIS — K449 Diaphragmatic hernia without obstruction or gangrene: Secondary | ICD-10-CM | POA: Diagnosis not present

## 2024-01-12 DIAGNOSIS — I77819 Aortic ectasia, unspecified site: Secondary | ICD-10-CM | POA: Insufficient documentation

## 2024-01-12 DIAGNOSIS — I712 Thoracic aortic aneurysm, without rupture, unspecified: Secondary | ICD-10-CM | POA: Diagnosis not present

## 2024-01-12 DIAGNOSIS — E042 Nontoxic multinodular goiter: Secondary | ICD-10-CM | POA: Diagnosis not present

## 2024-01-12 MED ORDER — IOHEXOL 350 MG/ML SOLN
75.0000 mL | Freq: Once | INTRAVENOUS | Status: AC | PRN
  Administered 2024-01-12: 75 mL via INTRAVENOUS

## 2024-01-15 ENCOUNTER — Encounter: Payer: Self-pay | Admitting: Internal Medicine

## 2024-01-15 ENCOUNTER — Encounter: Payer: Self-pay | Admitting: Cardiovascular Disease

## 2024-01-16 ENCOUNTER — Encounter: Payer: Self-pay | Admitting: Cardiovascular Disease

## 2024-01-16 ENCOUNTER — Telehealth: Payer: Self-pay | Admitting: Cardiovascular Disease

## 2024-01-16 NOTE — Progress Notes (Signed)
[facility] PREP Weekly Session  Patient Details  Name: Samantha [name] MRN: [id number] Date of Birth: [birth year] Age: 78 y.o. PCP: Madelin Headings, MD  Vitals:   01/16/24 1312  Weight: 183 lb (83 kg)     [facility] Weekly seesion - 01/16/24 1300       [facility] "PREP" Location   [facility] "PREP" Location [name]      Weekly Session   Topic Discussed Expectations and non-scale victories   Halfway through program, reveiw, revisit, restate goals; staying positive   Classes attended to date 67             Samantha [name] 01/16/2024, 1:13 PM

## 2024-01-16 NOTE — Telephone Encounter (Signed)
 Pt returning call, requesting cb

## 2024-01-16 NOTE — Telephone Encounter (Signed)
Patient returned RN Sarah's call regarding results and requested a call back around 4:00 pm.

## 2024-01-18 NOTE — Telephone Encounter (Signed)
Returned call to patient who wanted to discuss thyroid nodule that was discovered incidentally on recent CTA of Aorta. Informed that radiologist states it was 1.3cm and required no follow-up, but that I would send results to PCP (panosh) and she could request a thyroid ultrasound from her if she wishes to follow-up on it more.

## 2024-01-23 NOTE — Progress Notes (Signed)
[facility] PREP Weekly Session  Patient Details  Name: Samantha [name] MRN: [id number] Date of Birth: 07-12-1946 Age: 78 y.o. PCP: Madelin Headings, MD  There were no vitals filed for this visit.   [facility] Weekly seesion - 01/23/24 1300       [facility] "PREP" Location   [facility] "PREP" Location [name]      Weekly Session   Topic Discussed Other   Portion control; visualize your portion size demo; review of [employer] food label   Classes attended to date 21             Samantha [name] 01/23/2024, 1:25 PM

## 2024-01-24 ENCOUNTER — Encounter: Payer: Self-pay | Admitting: Internal Medicine

## 2024-01-24 ENCOUNTER — Ambulatory Visit: Payer: Medicare PPO | Admitting: Internal Medicine

## 2024-01-24 VITALS — BP 124/66 | HR 81 | Temp 97.9°F | Ht 61.5 in | Wt 182.4 lb

## 2024-01-24 DIAGNOSIS — E785 Hyperlipidemia, unspecified: Secondary | ICD-10-CM | POA: Diagnosis not present

## 2024-01-24 DIAGNOSIS — E041 Nontoxic single thyroid nodule: Secondary | ICD-10-CM | POA: Diagnosis not present

## 2024-01-24 NOTE — Patient Instructions (Signed)
Lab today .  Will be notified  of thyroid ultrasound.     Then fu as directed

## 2024-01-24 NOTE — Progress Notes (Signed)
Chief Complaint  Patient presents with   Abnormal CT scan    Pt reports she had ct scan for aneurism on aorta annually. She continues that they find a nodule on thyroid on left. Would like to discuss with provider.     HPI: Samantha [name] 78 y.o. come in for concerns  Had ct angio w contrast with ? Of thoracic a aneurysm  not dilatation but no aneurysm  Incidental thyroid nodule  1.3 cm  radiologist says no need for fu imaging but she is concerned about this and wants to make sure not cancerous .   Going to gym Pt program trying to stay active and  hard to lose weight.   See 12 24 cards note also stale  ROS: See pertinent positives and negatives per HPI.  Past Medical History:  Diagnosis Date   Aortic root dilation (HCC)    Arthritis    Colonic polyp    Left bundle branch block (LBBB)     Family History  Problem Relation Age of Onset   Cancer Sister        colon - 32   Heart attack Mother        57   Hypertension Mother    Other Daughter        murdered    Social History   Socioeconomic History   Marital status: Divorced    Spouse name: Not on file   Number of children: Not on file   Years of education: Not on file   Highest education level: Not on file  Occupational History   Not on file  Tobacco Use   Smoking status: Never   Smokeless tobacco: Never  Vaping Use   Vaping status: Never Used  Substance and Sexual Activity   Alcohol use: Yes    Alcohol/week: 10.0 standard drinks of alcohol    Types: 10 Glasses of wine per week    Comment: rae   Drug use: No   Sexual activity: Not on file  Other Topics Concern   Not on file  Social History Narrative   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   Regular exercise -  Only ocass now   Hh of 1    etoh 1-2 per week   Daughter murdered in her house   Social Drivers of Corporate investment banker Strain: Low Risk  (10/31/2022)   Overall Financial Resource Strain (CARDIA)     Difficulty of Paying Living Expenses: Not hard at all  Food Insecurity: No Food Insecurity (05/10/2023)   Hunger Vital Sign    Worried About Running Out of Food in the Last Year: Never true    Ran Out of Food in the Last Year: Never true  Transportation Needs: No Transportation Needs (05/10/2023)   PRAPARE - Administrator, Civil Service (Medical): No    Lack of Transportation (Non-Medical): No  Physical Activity: Inactive (10/31/2022)   Exercise Vital Sign    Days of Exercise per Week: 0 days    Minutes of Exercise per Session: 0 min  Stress: No Stress Concern Present (10/31/2022)   [employer] of Occupational Health - Occupational Stress Questionnaire    Feeling of Stress : Not at all  Social Connections: Moderately Integrated (10/31/2022)   Social Connection and Isolation Panel [NHANES]    Frequency of Communication with Friends and Family: More than three times a week  Frequency of Social Gatherings with Friends and Family: More than three times a week    Attends Religious Services: More than 4 times per year    Active Member of Clubs or Organizations: Yes    Attends Engineer, structural: More than 4 times per year    Marital Status: Never married    No outpatient medications prior to visit.   No facility-administered medications prior to visit.     EXAM:  BP 124/66 (BP Location: Left Arm, Patient Position: Sitting, Cuff Size: Large)   Pulse 81   Temp 97.9 F (36.6 C) (Oral)   Ht 5' 1.5" (1.562 m)   Wt 182 lb 6.4 oz (82.7 kg)   SpO2 95%   BMI 33.91 kg/m   Body mass index is 33.91 kg/m.  GENERAL: vitals reviewed and listed above, alert, oriented, appears well hydrated and in no acute distress HEENT: atraumatic, conjunctiva  clear, no obvious abnormalities on inspection of external nose and ears NECK: no obvious masses on inspection palpation  I dont feel the nodule  MS: moves all extremities  PSYCH: pleasant and cooperative,somewhat anxious  about health. Lab Results  Component Value Date   WBC 10.9 (H) 09/22/2023   HGB 12.4 09/22/2023   HCT 38.4 09/22/2023   PLT 325.0 09/22/2023   GLUCOSE 91 09/22/2023   CHOL 205 (H) 02/06/2023   TRIG 194 (H) 02/06/2023   HDL 75 02/06/2023   LDLDIRECT 136.0 05/15/2018   LDLCALC 97 02/06/2023   ALT 9 09/22/2023   AST 14 09/22/2023   NA 141 09/22/2023   K 4.1 09/22/2023   CL 105 09/22/2023   CREATININE 0.61 09/22/2023   BUN 11 09/22/2023   CO2 29 09/22/2023   TSH 1.22 11/08/2021   HGBA1C 5.4 09/22/2023   MICROALBUR 0.8 09/22/2023   BP Readings from Last 3 Encounters:  01/24/24 124/66  12/05/23 124/80  11/27/23 122/76  IMPRESSION: 1. Dilation of the ascending thoracic aorta measuring up to 3.7 cm, without frank aneurysm or evidence of dissection. 2. Incidental left thyroid nodule measuring 1.3 cm. No follow-up imaging is recommended. Reference: J Am Coll Radiol. 2015 Feb;12(2): 143-50 3. No acute intrathoracic process.     Electronically Signed   By: Sharlet Salina M.D.   On: 01/14/2024 22:38  ASSESSMENT AND PLAN:  Discussed the following assessment and plan:  Thyroid nodule - Plan: TSH, T4, free, Lipid panel, US THYROID  Hyperlipidemia, unspecified hyperlipidemia type - Plan: TSH, T4, free, Lipid panel Although radiology advises no imaging needed she is concerned . And nodiule over 1 cm so   Will order thyroid US and tsh free t4  Add lipid panel ( ate early this am protein chake and eggs  Dilated aortic root  followed  by cards team. Reviewed risk benefit of testings    32 minutes record review counsel  order and plan  -Patient advised to return or notify health care team  if  new concerns arise.  Patient Instructions  Lab today .  Will be notified  of thyroid ultrasound.     Then fu as directed    Samantha [name] M.D.

## 2024-01-25 LAB — LIPID PANEL
Cholesterol: 176 mg/dL (ref 0–200)
HDL: 71.9 mg/dL (ref >39.00)
LDL Cholesterol: 86 mg/dL (ref 0–99)
NonHDL: 104.21
Total CHOL/HDL Ratio: 2
Triglycerides: 93 mg/dL (ref 0.0–149.0)
VLDL: 18.6 mg/dL (ref 0.0–40.0)

## 2024-01-25 LAB — TSH: TSH: 1.02 u[IU]/mL (ref 0.35–5.50)

## 2024-01-25 LAB — T4, FREE: Free T4: 1.09 ng/dL (ref 0.60–1.60)

## 2024-01-26 ENCOUNTER — Encounter: Payer: Self-pay | Admitting: Internal Medicine

## 2024-01-26 NOTE — Progress Notes (Signed)
Thyroid function test normal  Cholesterol  level is in rangeimproved .  Reassuring .

## 2024-02-01 NOTE — Progress Notes (Signed)
 [facility] PRE[facility] Weekly Session  Patient Details  Name: RAINIE CRENSHAW MRN: [id number] Date of Birth: [birth year] Age: 78 y.o. PCP: Charlett Apolinar POUR, MD  Vitals:   02/01/24 1257  Weight: 177 lb (80.3 kg)     [facility] Weekly seesion - 02/01/24 1200       [facility] PREP Location   [facility] PREP Location [name]      Weekly Session   Topic Discussed Calorie breakdown   Importance of quality Carbs, proteins and fats; complex vs. simple carbs; supplements   Minutes exercised this week 320 minutes    Classes attended to date 52             Daishia Fetterly B Reanne Nellums 02/01/2024, 12:59 PM

## 2024-02-05 ENCOUNTER — Ambulatory Visit (HOSPITAL_BASED_OUTPATIENT_CLINIC_OR_DEPARTMENT_OTHER)
Admission: RE | Admit: 2024-02-05 | Discharge: 2024-02-05 | Disposition: A | Discharge status: Home / Self Care | Payer: Medicare PPO | Source: Ambulatory Visit | Attending: Internal Medicine | Admitting: Internal Medicine

## 2024-02-05 DIAGNOSIS — E041 Nontoxic single thyroid nodule: Secondary | ICD-10-CM | POA: Diagnosis not present

## 2024-02-13 NOTE — Progress Notes (Signed)
 [facility] PREP Weekly Session  Patient Details  Name: NYOMI HOWSER MRN: [id number] Date of Birth: June 23, 1946 Age: 78 y.o. PCP: Madelin Headings, MD  Vitals:   02/13/24 1332  Weight: 179 lb (81.2 kg)     [facility] Weekly seesion - 02/13/24 1300       [facility] "PREP" Location   [facility] "PREP" Location [name]      Weekly Session   Topic Discussed Other   Fit testing completed; how fit and strong survey completed; appt made for final assessment visit; review of end of program paperwork. Shared healthy habits/fruit bowl meditation   Minutes exercised this week 240 minutes    Classes attended to date 41             Allenmichael Mcpartlin B Brian Kocourek 02/13/2024, 1:33 PM

## 2024-02-14 ENCOUNTER — Encounter: Payer: Self-pay | Admitting: Internal Medicine

## 2024-02-14 NOTE — Progress Notes (Signed)
 So results show probably benign nodule   but as you can see by radiology  reading  to be  certain advised recheck [place] in a year.   ( BTW spongiform nodules that was alluded to are benign)   So order  team  please order  a  repeat thyroid ultrasound for 1 year . Dx fu thyroid nodule

## 2024-02-19 NOTE — Progress Notes (Signed)
 [facility]  Patient Details  Name: Samantha [name] MRN: [id number] Date of Birth: 22-Sep-1946 Age: 78 y.o. PCP: Madelin Headings, MD  Vitals:   02/19/24 1156  BP: 126/78  Pulse: 90  SpO2: 97%  Weight: 179 lb (81.2 kg)     [facility] Eval - 02/19/24 1100       [facility] "PREP" Location   [facility] "PREP" Location [name]      Referral    Program Start Date 11/27/23    Program End Date 02/15/24      Measurement   Waist Circumference 45 inches    Waist Circumference End Program 43.5 inches    Hip Circumference 49 inches    Hip Circumference End Program 47.5 inches    Body fat 48.2 percent      Mobility and Daily Activities   I find it easy to walk up or down two or more flights of stairs. 3    I have no trouble taking out the trash. 2    I do housework such as vacuuming and dusting on my own without difficulty. 3    I can easily lift a gallon of milk (8lbs). 4    I can easily walk a mile. 4    I have no trouble reaching into high cupboards or reaching down to pick up something from the floor. 3    I do not have trouble doing out-door work such as Loss adjuster, chartered, raking leaves, or gardening. 3      Mobility and Daily Activities   I feel younger than my age. 2    I feel independent. 3    I feel energetic. 2    I live an active life.  3    I feel strong. 2    I feel healthy. 3    I feel active as other people my age. 3      How fit and strong are you.   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

## 2024-02-20 ENCOUNTER — Ambulatory Visit: Payer: Self-pay | Admitting: Internal Medicine

## 2024-02-20 NOTE — Telephone Encounter (Signed)
 Copied from CRM [id number]. Topic: Clinical - Medication Question >> Feb 20, 2024  4:28 PM Denese Killings wrote: Reason for CRM: Patient wants to know if she can get be prescribed one tablet antibiotic for her dental appointment tomorrow at 2:00. She states that her doctor requires it for her as a preventative of bacteria going into her hip. Patient is requesting a callback.

## 2024-02-20 NOTE — Telephone Encounter (Signed)
 Patient called, left VM to return the call to the office to speak to the NT.  2nd attempt.   Copied from CRM [id number]. Topic: Clinical - Medication Question >> Feb 20, 2024  4:28 PM Samantha [name] wrote: Reason for CRM: Patient wants to know if she can get be prescribed one tablet antibiotic for her dental appointment tomorrow at 2:00. She states that her doctor requires it for her as a preventative of bacteria going into her hip. Patient is requesting a callback.

## 2024-02-20 NOTE — Telephone Encounter (Signed)
 This RN made 3rd attempt to reach patient, LVM requesting call back. Routing to clinic.

## 2024-02-20 NOTE — Telephone Encounter (Signed)
 Copied from CRM [id number]. Topic: Clinical - Medication Question >> Feb 20, 2024  4:28 PM Denese Killings wrote: Reason for CRM: Patient wants to know if she can get be prescribed one tablet antibiotic for her dental appointment tomorrow at 2:00. She states that her doctor requires it for her as a preventative of bacteria going into her hip. Patient is requesting a callback.   Patient states that her dentist would like her to take an antibiotic before her cleaning tomorrow due to her previous hip surgery. Patient would like a call back if possible with a response. She denies any complaints at this time.    Reason for Disposition  Prescription request for new medicine (not a refill)  Answer Assessment - Initial Assessment Questions 1. NAME of MEDICINE: "What medicine(s) are you calling about?"     An antibiotic  2. QUESTION: "What is your question?" (e.g., double dose of medicine, side effect)     Wants an antibiotic for her dental cleaning tomorrow per her dentists request  Protocols used: Medication Question Call-A-AH

## 2024-02-21 ENCOUNTER — Other Ambulatory Visit (HOSPITAL_BASED_OUTPATIENT_CLINIC_OR_DEPARTMENT_OTHER): Payer: Self-pay

## 2024-02-21 ENCOUNTER — Other Ambulatory Visit: Payer: Self-pay | Admitting: Family

## 2024-02-21 DIAGNOSIS — E041 Nontoxic single thyroid nodule: Secondary | ICD-10-CM

## 2024-02-21 MED ORDER — AMOXICILLIN 500 MG PO CAPS: 2000.0000 mg | ORAL_CAPSULE | Freq: Once | ORAL | 0 refills | Status: AC

## 2024-02-22 NOTE — Telephone Encounter (Signed)
 Follow up with pt.   Pt states it is late and her dentist states she didn't need the Rx. Pt states she appreciates for sending it in. Pt states she doesn't need it and won't pick it up.   Inform pt that is fine. No further action is needed.

## 2024-05-13 DIAGNOSIS — H903 Sensorineural hearing loss, bilateral: Secondary | ICD-10-CM | POA: Diagnosis not present

## 2024-05-22 ENCOUNTER — Ambulatory Visit: Admitting: Internal Medicine

## 2024-05-22 ENCOUNTER — Encounter: Payer: Self-pay | Admitting: Internal Medicine

## 2024-05-22 VITALS — BP 118/70 | HR 68 | Temp 97.8°F | Ht 61.5 in | Wt 172.8 lb

## 2024-05-22 DIAGNOSIS — E785 Hyperlipidemia, unspecified: Secondary | ICD-10-CM

## 2024-05-22 DIAGNOSIS — Z79899 Other long term (current) drug therapy: Secondary | ICD-10-CM | POA: Diagnosis not present

## 2024-05-22 DIAGNOSIS — Z96641 Presence of right artificial hip joint: Secondary | ICD-10-CM | POA: Diagnosis not present

## 2024-05-22 DIAGNOSIS — E669 Obesity, unspecified: Secondary | ICD-10-CM | POA: Diagnosis not present

## 2024-05-22 DIAGNOSIS — R635 Abnormal weight gain: Secondary | ICD-10-CM

## 2024-05-22 MED ORDER — RYBELSUS 3 MG PO TABS: 3.0000 mg | ORAL_TABLET | Freq: Every day | ORAL | Status: DC

## 2024-05-22 NOTE — Patient Instructions (Signed)
 Update lab tpday  Continue  optimized    exercise   and eating.   Muscle exercises.   If all ok we can rx rybelsus  as a trial  3 mg per day and then increase to 7 mg per day . This is off label but the same  medication that is in wegovy.  (In an injectable form )

## 2024-05-22 NOTE — Progress Notes (Signed)
 Chief Complaint  Patient presents with   Weight Gain    Pt would like to discuss getting on weight loss medication. Pt states she go the "Y" 2 days a wk. She states she had lost some weight but not quick enough.     HPI: Samantha [name] 78 y.o. come in for  help with weight  she has done well recently   Now in [facility] program.  Resistance training  and losing some weight and feeling better  but still wants to expore weight loss possibilities and is aware of  issues risk benefit and u unknowns   doesn't want a n injection  Is doing better than  right after hip surgery . Card rehab people were helpful. No  ongoing gi sx  ROS: See pertinent positives and negatives per HPI.  Past Medical History:  Diagnosis Date   Aortic root dilation (HCC)    Arthritis    Colonic polyp    Left bundle branch block (LBBB)     Family History  Problem Relation Age of Onset   Cancer Sister        colon - 12   Heart attack Mother        60   Hypertension Mother    Other Daughter        murdered    Social History   Socioeconomic History   Marital status: Divorced    Spouse name: Not on file   Number of children: Not on file   Years of education: Not on file   Highest education level: Not on file  Occupational History   Not on file  Tobacco Use   Smoking status: Never   Smokeless tobacco: Never  Vaping Use   Vaping status: Never Used  Substance and Sexual Activity   Alcohol use: Yes    Alcohol/week: 10.0 standard drinks of alcohol    Types: 10 Glasses of wine per week    Comment: rae   Drug use: No   Sexual activity: Not on file  Other Topics Concern   Not on file  Social History Narrative   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   Regular exercise -  Only ocass now   Hh of 1    etoh 1-2 per week   Daughter murdered in her house   Social Drivers of Corporate investment banker Strain: Low Risk  (10/31/2022)   Overall Financial Resource Strain  (CARDIA)    Difficulty of Paying Living Expenses: Not hard at all  Food Insecurity: No Food Insecurity (05/10/2023)   Hunger Vital Sign    Worried About Running Out of Food in the Last Year: Never true    Ran Out of Food in the Last Year: Never true  Transportation Needs: No Transportation Needs (05/10/2023)   PRAPARE - Administrator, Civil Service (Medical): No    Lack of Transportation (Non-Medical): No  Physical Activity: Inactive (10/31/2022)   Exercise Vital Sign    Days of Exercise per Week: 0 days    Minutes of Exercise per Session: 0 min  Stress: No Stress Concern Present (10/31/2022)   [employer] of Occupational Health - Occupational Stress Questionnaire    Feeling of Stress : Not at all  Social Connections: Moderately Integrated (10/31/2022)   Social Connection and Isolation Panel [NHANES]    Frequency of Communication with Friends and Family: More than three times a week  Frequency of Social Gatherings with Friends and Family: More than three times a week    Attends Religious Services: More than 4 times per year    Active Member of Clubs or Organizations: Yes    Attends Engineer, structural: More than 4 times per year    Marital Status: Never married    No outpatient medications prior to visit.   No facility-administered medications prior to visit.     EXAM:  BP 118/70 (BP Location: Left Arm, Patient Position: Sitting, Cuff Size: Large)   Pulse 68   Temp 97.8 F (36.6 C) (Oral)   Ht 5' 1.5" (1.562 m)   Wt 172 lb 12.8 oz (78.4 kg)   SpO2 95%   BMI 32.12 kg/m   Body mass index is 32.12 kg/m. Wt Readings from Last 3 Encounters:  05/22/24 172 lb 12.8 oz (78.4 kg)  02/19/24 179 lb (81.2 kg)  02/13/24 179 lb (81.2 kg)    GENERAL: vitals reviewed and listed above, alert, oriented, appears well hydrated and in no acute distress HEENT: atraumatic, conjunctiva  clear, no obvious abnormalities on inspection of external nose and ears NECK:  no obvious masses on inspection palpation  CV: HRRR, no clubbing cyanosis or  peripheral edema nl cap refill  MS: moves all extremities without noticeable focal  abnormality independent gait  PSYCH: pleasant and cooperative, no obvious depression or anxiety Lab Results  Component Value Date   WBC 10.9 (H) 09/22/2023   HGB 12.4 09/22/2023   HCT 38.4 09/22/2023   PLT 325.0 09/22/2023   GLUCOSE 91 09/22/2023   CHOL 176 01/24/2024   TRIG 93.0 01/24/2024   HDL 71.90 01/24/2024   LDLDIRECT 136.0 05/15/2018   LDLCALC 86 01/24/2024   ALT 9 09/22/2023   AST 14 09/22/2023   NA 141 09/22/2023   K 4.1 09/22/2023   CL 105 09/22/2023   CREATININE 0.61 09/22/2023   BUN 11 09/22/2023   CO2 29 09/22/2023   TSH 1.02 01/24/2024   HGBA1C 5.4 09/22/2023   MICROALBUR 0.8 09/22/2023   BP Readings from Last 3 Encounters:  05/22/24 118/70  02/19/24 126/78  01/24/24 124/66    ASSESSMENT AND PLAN:  Discussed the following assessment and plan:  Weight gain - Plan: Comprehensive metabolic panel with GFR, TSH, T4, free, Hemoglobin A1c, CBC with Differential/Platelet  Medication management - Plan: Comprehensive metabolic panel with GFR, TSH, T4, free, Hemoglobin A1c, CBC with Differential/Platelet  Obesity (BMI 30-39.9) - Plan: Comprehensive metabolic panel with GFR, TSH, T4, free, Hemoglobin A1c, CBC with Differential/Platelet  Hyperlipidemia, unspecified hyperlipidemia type  S/P hip replacement, right Update lab   optimize resistance exercise  also and continue  Disc meds glp1s and only oral med would be off label but is same med as in wegovy injection . right Risk benefit of medication discussed. Of what is known  Sample of  rybelsus 3 mg for now lab today and then fu 1 mos  message virtual appt ok and plan fur inc dose if affordable for her  -Patient advised to return or notify health care team  if  new concerns arise.  Patient Instructions  Update lab tpday  Continue  optimized     exercise   and eating.   Muscle exercises.   If all ok we can rx rybelsus  as a trial  3 mg per day and then increase to 7 mg per day . This is off label but the same  medication that is in wegovy.  (  In an injectable form )   Davied Nocito K. Gunhild Bautch M.D.

## 2024-05-23 ENCOUNTER — Ambulatory Visit: Payer: Self-pay | Admitting: Internal Medicine

## 2024-05-23 LAB — COMPREHENSIVE METABOLIC PANEL WITH GFR
ALT: 10 U/L (ref 0–35)
AST: 15 U/L (ref 0–37)
Albumin: 4.1 g/dL (ref 3.5–5.2)
Alkaline Phosphatase: 79 U/L (ref 39–117)
BUN: 18 mg/dL (ref 6–23)
CO2: 30 meq/L (ref 19–32)
Calcium: 9.1 mg/dL (ref 8.4–10.5)
Chloride: 102 meq/L (ref 96–112)
Creatinine, Ser: 0.57 mg/dL (ref 0.40–1.20)
GFR: 87.55 mL/min (ref >60.00)
Glucose, Bld: 81 mg/dL (ref 70–99)
Potassium: 4.4 meq/L (ref 3.5–5.1)
Sodium: 138 meq/L (ref 135–145)
Total Bilirubin: 1.1 mg/dL (ref 0.2–1.2)
Total Protein: 7.1 g/dL (ref 6.0–8.3)

## 2024-05-23 LAB — CBC WITH DIFFERENTIAL/PLATELET
Basophils Absolute: 0.1 10*3/uL (ref 0.0–0.1)
Basophils Relative: 0.7 % (ref 0.0–3.0)
Eosinophils Absolute: 0.1 10*3/uL (ref 0.0–0.7)
Eosinophils Relative: 1 % (ref 0.0–5.0)
HCT: 41.3 % (ref 36.0–46.0)
Hemoglobin: 13.5 g/dL (ref 12.0–15.0)
Lymphocytes Relative: 29.1 % (ref 12.0–46.0)
Lymphs Abs: 2.9 10*3/uL (ref 0.7–4.0)
MCHC: 32.7 g/dL (ref 30.0–36.0)
MCV: 93.9 fl (ref 78.0–100.0)
Monocytes Absolute: 0.5 10*3/uL (ref 0.1–1.0)
Monocytes Relative: 5.3 % (ref 3.0–12.0)
Neutro Abs: 6.5 10*3/uL (ref 1.4–7.7)
Neutrophils Relative %: 63.9 % (ref 43.0–77.0)
Platelets: 293 10*3/uL (ref 150.0–400.0)
RBC: 4.4 Mil/uL (ref 3.87–5.11)
RDW: 13.2 % (ref 11.5–15.5)
WBC: 10.1 10*3/uL (ref 4.0–10.5)

## 2024-05-23 LAB — HEMOGLOBIN A1C: Hgb A1c MFr Bld: 5.3 % (ref 4.6–6.5)

## 2024-05-23 LAB — T4, FREE: Free T4: 0.9 ng/dL (ref 0.60–1.60)

## 2024-05-23 LAB — TSH: TSH: 1.4 u[IU]/mL (ref 0.35–5.50)

## 2024-05-23 NOTE — Progress Notes (Signed)
 All results are normal range   reassuring   no diabetes and  thyroid  normal

## 2024-07-09 DIAGNOSIS — L821 Other seborrheic keratosis: Secondary | ICD-10-CM | POA: Diagnosis not present

## 2024-07-09 DIAGNOSIS — Z1283 Encounter for screening for malignant neoplasm of skin: Secondary | ICD-10-CM | POA: Diagnosis not present

## 2024-07-09 DIAGNOSIS — D225 Melanocytic nevi of trunk: Secondary | ICD-10-CM | POA: Diagnosis not present

## 2024-08-27 ENCOUNTER — Other Ambulatory Visit (HOSPITAL_BASED_OUTPATIENT_CLINIC_OR_DEPARTMENT_OTHER): Payer: Self-pay

## 2024-09-02 ENCOUNTER — Telehealth: Payer: Self-pay | Admitting: Internal Medicine

## 2024-09-02 ENCOUNTER — Encounter: Payer: Self-pay | Admitting: Internal Medicine

## 2024-09-02 NOTE — Telephone Encounter (Unsigned)
 Copied from CRM [id number]. Topic: Clinical - Medication Question >> Sep 02, 2024  8:30 AM Berwyn MATSU wrote: Reason for CRM: Patient called in today to request a prescription for the covid shot.   May you please assist.

## 2024-09-03 ENCOUNTER — Telehealth: Payer: Self-pay | Admitting: *Deleted

## 2024-09-03 MED ORDER — COVID-19MRNA BIVAL VACC PFIZER 30 MCG/0.3ML IM SUSP: 0.3000 mL | Freq: Once | INTRAMUSCULAR | 0 refills | Status: AC

## 2024-09-03 NOTE — Telephone Encounter (Signed)
 Copied from CRM [id number]. Topic: Clinical - Medication Question >> Sep 03, 2024 10:06 AM Terri G wrote: Reason for CRM: Patient would like for a nurse to contact her about getting an vaccine. I did advised I could put the order in for her but she insist on speaking to Dr.Panosh nurse. Callback [phone]

## 2024-09-03 NOTE — Telephone Encounter (Signed)
Addressed.   Please see other encounter.

## 2024-09-03 NOTE — Telephone Encounter (Signed)
 Patient popped in to the office to request rx for covid vaccine. Pt going on a trip and would like to have this prior to her leaving in 2 weeks. Requests a call to let her know when it's been sent to the pharmacy.

## 2024-09-03 NOTE — Telephone Encounter (Signed)
 Rx for covid sent. Spoke to pt. Pt is aware.

## 2024-09-10 DIAGNOSIS — R42 Dizziness and giddiness: Secondary | ICD-10-CM | POA: Diagnosis not present

## 2024-09-10 DIAGNOSIS — H903 Sensorineural hearing loss, bilateral: Secondary | ICD-10-CM | POA: Diagnosis not present

## 2024-09-10 DIAGNOSIS — Z974 Presence of external hearing-aid: Secondary | ICD-10-CM | POA: Diagnosis not present

## 2024-11-05 ENCOUNTER — Encounter: Payer: Self-pay | Admitting: Family Medicine

## 2024-11-05 ENCOUNTER — Ambulatory Visit: Admitting: Family Medicine

## 2024-11-05 DIAGNOSIS — Z0001 Encounter for general adult medical examination with abnormal findings: Secondary | ICD-10-CM

## 2024-11-05 DIAGNOSIS — R059 Cough, unspecified: Secondary | ICD-10-CM

## 2024-11-05 DIAGNOSIS — Z0000 Encounter for general adult medical examination without abnormal findings: Secondary | ICD-10-CM

## 2024-11-05 DIAGNOSIS — J069 Acute upper respiratory infection, unspecified: Secondary | ICD-10-CM

## 2024-11-05 MED ORDER — BENZONATATE 200 MG PO CAPS
200.0000 mg | ORAL_CAPSULE | Freq: Two times a day (BID) | ORAL | 0 refills | Status: AC | PRN
Start: 2024-11-05 — End: ?

## 2024-11-05 NOTE — Patient Instructions (Signed)
 -I sent the medication(s) we discussed to your pharmacy: Meds ordered this encounter  Medications   benzonatate (TESSALON) 200 MG capsule    Sig: Take 1 capsule (200 mg total) by mouth 2 (two) times daily as needed.    Dispense:  20 capsule    Refill:  0     I hope you are feeling better soon!  Seek in person care promptly if your symptoms worsen, new concerns arise or you are not improving with treatment.  It was nice to meet you today. I help [facility] out with telemedicine visits on Tuesdays and Thursdays and am happy to help if you need a virtual follow up visit on those days. Otherwise, if you have any concerns or questions following this visit please schedule a follow up visit with your Primary Care office or seek care at a local urgent care clinic to avoid delays in care. If you are having severe or life threatening symptoms please call 911 and/or go to the nearest emergency room.    MEDICARE VISIT:   -follow up yearly for annual wellness visit with primary care office  Here is a list of your preventive care/health maintenance measures and the plan for each if any are due:  PLAN For any measures below that may be due:    1. Please schedule mammogram.   2. Please let us  know if you wish to do the Hep C screening  Health Maintenance  Topic Date Due   Hepatitis C Screening  Never done   Mammogram  09/29/2024   Medicare Annual Wellness (AWV)  11/06/2024   COVID-19 Vaccine (13 - 2025-26 season) 11/21/2024 (Originally 10/29/2024)   DEXA SCAN  11/06/2029 (Originally 10/21/2011)   DTaP/Tdap/Td (2 - Td or Tdap) 10/21/2026   Pneumococcal Vaccine: 50+ Years  Completed   Influenza Vaccine  Completed   Zoster Vaccines- Shingrix  Completed   Meningococcal B Vaccine  Aged Out   Hepatitis B Vaccines 19-59 Average Risk  Discontinued   Colonoscopy  Discontinued    -See a dentist at least yearly  -Get your eyes checked and then per your eye specialist's  recommendations  -Other issues addressed today:   -I have included below further information regarding a healthy whole foods based diet, physical activity guidelines for adults, stress management and opportunities for social connections. I hope you find this information useful.   -----------------------------------------------------------------------------------------------------------------------------------------------------------------------------------------------------------------------------------------------------------    NUTRITION: -eat real food: lots of colorful vegetables (half the plate) and fruits -5-7 servings of vegetables and fruits per day (fresh or steamed is best), exp. 2 servings of vegetables with lunch and dinner and 2 servings of fruit per day. Berries and greens such as kale and collards are great choices.  -consume on a regular basis:  fresh fruits, fresh veggies, fish, nuts, seeds, healthy oils (such as olive oil, avocado oil), whole grains (make sure for bread/pasta/crackers/etc., that the first ingredient on label contains the word whole), legumes. -can eat small amounts of dairy and lean meat (no larger than the palm of your hand), but avoid processed meats such as ham, bacon, lunch meat, etc. -drink water  -try to avoid fast food and pre-packaged foods, processed meat, ultra processed foods/beverages (donuts, candy, etc.) -most experts advise limiting sodium to < 2300mg  per day, should limit further is any chronic conditions such as high blood pressure, heart disease, diabetes, etc. The American Heart Association advised that < 1500mg  is is ideal -try to avoid foods/beverages that contain any ingredients with names you do  not recognize  -try to avoid foods/beverages  with added sugar or sweeteners/sweets  -try to avoid sweet drinks (including diet drinks): soda, juice, Gatorade, sweet tea, power drinks, diet drinks -try to avoid white rice, white bread, pasta  (unless whole grain)  EXERCISE GUIDELINES FOR ADULTS: -if you wish to increase your physical activity, do so gradually and with the approval of your doctor -STOP and seek medical care immediately if you have any chest pain, chest discomfort or trouble breathing when starting or increasing exercise  -move and stretch your body, legs, feet and arms when sitting for long periods -Physical activity guidelines for optimal health in adults: -get at least 150 minutes per week of moderate exercise (can talk, but not sing); this is about 20-30 minutes of sustained activity 5-7 days per week or two 10-15 minute episodes of sustained activity 5-7 days per week -do some muscle building/resistance training/strength training at least 2 days per week  -balance exercises 3+ days per week:   Stand somewhere where you have something sturdy to hold onto if you lose balance    1) lift up on toes, then back down, start with 5x per day and work up to 20x   2) stand and lift one leg straight out to the side so that foot is a few inches of the floor, start with 5x each side and work up to 20x each side   3) stand on one foot, start with 5 seconds each side and work up to 20 seconds on each side  If you need ideas or help with getting more active:  -Silver sneakers https://tools.silversneakers.com  -Walk with a Doc: Http://www.duncan-williams.com/  -try to include resistance (weight lifting/strength building) and balance exercises twice per week: or the following link for ideas: http://castillo-powell.com/  buyducts.dk  STRESS MANAGEMENT: -can try meditating, or just sitting quietly with deep breathing while intentionally relaxing all parts of your body for 5 minutes daily -if you need further help with stress, anxiety or depression please follow up with your primary doctor or contact the wonderful folks at [facility]: [phone]  SOCIAL CONNECTIONS: -options in [place] if you wish to engage in more social and exercise related activities:  -Silver sneakers https://tools.silversneakers.com  -Walk with a Doc: Http://www.duncan-williams.com/  -Check out the [facility] Active Adults 50+ section on the [place] of [employer] companies (hiking clubs, book clubs, cards and games, chess, exercise classes, aquatic classes and much more) - see the website for details: https://www.[place].gov/departments/parks-recreation/active-adults50  -YouTube has lots of exercise videos for different ages and abilities as well  -[facility] (a variety of indoor and outdoor inperson activities for adults). [phone]. [address]  -Virtual Online Classes (a variety of topics): see seniorplanet.org or call [phone]  -consider volunteering at a school, hospice center, church, senior center or elsewhere

## 2024-11-05 NOTE — Progress Notes (Signed)
 ------------------------------------------------------------------------------------------------------------------------------------------------------------  Because this visit was a virtual/telehealth visit, some criteria may be missing or patient reported. Any vitals not documented were not able to be obtained and vitals that have been documented are patient reported.    MEDICARE ANNUAL PREVENTIVE VISIT WITH PROVIDER: (Welcome to [facility], initial annual wellness or annual wellness exam)  Virtual Visit via Video Note  I connected with Samantha [name] on 11/05/24 by a video enabled telemedicine application and verified that I am speaking with the correct person using two identifiers.  Location patient: home Location provider:work or home office Persons participating in the virtual visit: patient, provider  Concerns and/or follow up today: detailed intake and risks/health assessment completed in flowsheets and below - please see for details. Has had cold the last 1 week. Had nasal congestion, PND, cough. Would like a cough medication. No fever, CP, SOB, VD, body aches. Improving but has lingering cough.   How often do you have a drink containing alcohol? Rare now - has quit because of weight, does drink when traveling How many drinks containing alcohol do you have on a typical day when you are drinking?1 How often do you have six or more drinks on one occasion? Not anymore Have you ever smoked?n Quit date if applicable? na  How many packs a day do/did you smoke? na Do you use smokeless tobacco?n Do you use an illicit drugs?n Do you feel safe at home?y Last dentist visit? Goes on regular basis Last eye Exam and location? [facility]   See HM section in Epic for other details of completed HM.    ROS: negative for report of fevers, unintentional weight loss, vision changes, vision loss, hearing loss or change, chest pain, sob, hemoptysis, melena, hematochezia, hematuria, falls,  bleeding or bruising, thoughts of suicide or self harm, memory loss  Patient-completed extensive health risk assessment - reviewed and discussed with the patient: See Health Risk Assessment completed with patient prior to the visit either above or in recent phone note. This was reviewed in detailed with the patient today and appropriate recommendations, orders and referrals were placed as needed per Summary below and patient instructions.   Review of Medical History: -PMH, PSH, Family History and current specialty and care providers reviewed and updated and listed below   Patient Care Team: Panosh, Apolinar POUR, MD as PCP - General Nahser, Aleene PARAS, MD (Inactive) as PCP - Cardiology (Cardiology) Nahser, Aleene PARAS, MD (Inactive) (Cardiology) Kristie Lamprey, MD as Attending Physician (Gastroenterology) Sheffield, Andrez SAUNDERS, PA-C (Inactive) as Physician Assistant (Dermatology) Cleotilde Lowe as Attending Physician   Past Medical History:  Diagnosis Date   Aortic root dilation    Arthritis    Colonic polyp    Left bundle branch block (LBBB)     Past Surgical History:  Procedure Laterality Date   CHOLECYSTECTOMY     TONSILLECTOMY     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   Procedure: TOTAL HIP ARTHROPLASTY ANTERIOR APPROACH;  Surgeon: Melodi Lerner, MD;  Location: [facility];  Service: Orthopedics;  Laterality: Right;    Social History   Socioeconomic History   Marital status: Divorced    Spouse name: Not on file   Number of children: Not on file   Years of education: Not on file   Highest education level: Doctorate  Occupational History   Not on file  Tobacco Use   Smoking status: Never   Smokeless tobacco: Never  Vaping Use   Vaping status: Never Used  Substance and Sexual Activity  Alcohol use: Yes    Alcohol/week: 10.0 standard drinks of alcohol    Types: 10 Glasses of wine per week    Comment: rae   Drug use: No   Sexual activity: Not on file  Other Topics Concern   Not on  file  Social History Narrative   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   Regular exercise -  Only ocass now   Hh of 1    etoh 1-2 per week   Daughter murdered in her house   Social Drivers of Corporate Investment Banker Strain: Low Risk  (10/31/2022)   Overall Financial Resource Strain (CARDIA)    Difficulty of Paying Living Expenses: Not hard at all  Food Insecurity: No Food Insecurity (05/10/2023)   Hunger Vital Sign    Worried About Running Out of Food in the Last Year: Never true    Ran Out of Food in the Last Year: Never true  Transportation Needs: No Transportation Needs (11/05/2024)   PRAPARE - Administrator, Civil Service (Medical): No    Lack of Transportation (Non-Medical): No  Physical Activity: Sufficiently Active (11/05/2024)   Exercise Vital Sign    Days of Exercise per Week: 4 days    Minutes of Exercise per Session: 60 min  Stress: No Stress Concern Present (11/05/2024)   [employer] of Occupational Health - Occupational Stress Questionnaire    Feeling of Stress: Not at all  Social Connections: Unknown (11/05/2024)   Social Connection and Isolation Panel    Frequency of Communication with Friends and Family: More than three times a week    Frequency of Social Gatherings with Friends and Family: Once a week    Attends Religious Services: Patient declined    Database Administrator or Organizations: Yes    Attends Engineer, Structural: More than 4 times per year    Marital Status: Patient declined  Intimate Partner Violence: Not At Risk (05/10/2023)   Humiliation, Afraid, Rape, and Kick questionnaire    Fear of Current or Ex-Partner: No    Emotionally Abused: No    Physically Abused: No    Sexually Abused: No    Family History  Problem Relation Age of Onset   Cancer Sister        colon - 35   Heart attack Mother        47   Hypertension Mother    Other Daughter        murdered    No current  outpatient medications on file prior to visit.   No current facility-administered medications on file prior to visit.    Allergies  Allergen Reactions   Quinolones Other (See Comments)    Aortic aneurysm. Patients should avoid         Physical Exam Vitals requested from patient and listed below if patient had equipment and was able to obtain at home for this virtual visit: There were no vitals filed for this visit. Estimated body mass index is 32.12 kg/m as calculated from the following:   Height as of 05/22/24: 5' 1.5 (1.562 m).   Weight as of 05/22/24: 172 lb 12.8 oz (78.4 kg).  EKG (optional): deferred due to virtual visit  GENERAL: alert, oriented, no acute distress detected, full vision exam deferred due to pandemic and/or virtual encounter  HEENT: atraumatic, conjunttiva clear, no obvious abnormalities on inspection of external nose and ears  NECK:  normal movements of the head and neck  LUNGS: on inspection no signs of respiratory distress, breathing rate appears normal, no obvious gross SOB, gasping or wheezing  CV: no obvious cyanosis  MS: moves all visible extremities without noticeable abnormality  PSYCH/NEURO: pleasant and cooperative, no obvious depression or anxiety, speech and thought processing grossly intact, Cognitive function grossly intact  Flowsheet Row Clinical Support from 11/05/2024 in [facility] [place]  PHQ-9 Total Score 2        11/05/2024   12:46 PM 11/07/2023    5:44 PM 08/18/2023   11:07 AM 10/31/2022    2:19 PM 10/11/2021    3:13 PM  Depression screen PHQ 2/9  Decreased Interest 0 0 0 0 0  Down, Depressed, Hopeless 0 0 0 0 0  PHQ - 2 Score 0 0 0 0 0  Altered sleeping 0      Tired, decreased energy 0      Change in appetite 2      Feeling bad or failure about yourself  0      Trouble concentrating 0      Moving slowly or fidgety/restless 0      Suicidal thoughts 0      PHQ-9 Score 2           08/18/2023    11:07 AM 11/07/2023    5:44 PM 11/05/2024    6:09 AM 11/05/2024   12:42 PM 11/05/2024    1:02 PM  Fall Risk  Falls in the past year? 0 0 0 0   Was there an injury with Fall? 0 0  0   Fall Risk Category Calculator 0 0  0   Patient at Risk for Falls Due to    No Fall Risks No Fall Risks  Fall risk Follow up Falls evaluation completed   Falls evaluation completed Falls evaluation completed     SUMMARY AND PLAN:    Cough, unspecified type Viral upper respiratory tract infection -query vuri, she feels was a cold, discussed other possibilities as well. She wants to try Tessalon rx for cough. Advised if worsening or not continuing to improve over the next few days to seek inperson care.   Encounter for Medicare annual wellness exam  Discussed applicable health maintenance/preventive health measures and advised and referred or ordered per patient preferences: -declined bone density test -she gave me date on her covid vaccine, updated -she prefers to schedule her mammogram and reports she has number to call Health Maintenance  Topic Date Due   Hepatitis C Screening  Never done   Mammogram  09/29/2024   COVID-19 Vaccine (13 - 2025-26 season) 11/21/2024 (Originally 10/29/2024)   DEXA SCAN  11/06/2029 (Originally 10/21/2011)   Medicare Annual Wellness (AWV)  11/05/2025   DTaP/Tdap/Td (2 - Td or Tdap) 10/21/2026   Pneumococcal Vaccine: 50+ Years  Completed   Influenza Vaccine  Completed   Zoster Vaccines- Shingrix  Completed   Meningococcal B Vaccine  Aged Out   Hepatitis B Vaccines 19-59 Average Risk  Discontinued   Colonoscopy  Discontinued      Education and counseling on the following was provided based on the above review of health and a plan/checklist for the patient, along with additional information discussed, was provided for the patient in the patient instructions :  -Provided counseling and plan for difficulty hearing - she sees audiology  -Advised and counseled on a  healthy lifestyle - including the importance of a healthy diet, regular physical  activity, social connections and stress management. -Reviewed patient's current diet. Advised and counseled on a whole foods based healthy diet. A summary of a healthy diet was provided in the Patient Instructions.  -reviewed patient's current physical activity level and discussed exercise guidelines for adults. Congratulated on healthy choice and further info provided in patient instructions.  -Advise yearly dental visits at minimum and regular eye exams -Advised and counseled on alcohol, congratulated on giving up regular drinking.   Follow up: see patient instructions     Patient Instructions    -I sent the medication(s) we discussed to your pharmacy: Meds ordered this encounter  Medications   benzonatate (TESSALON) 200 MG capsule    Sig: Take 1 capsule (200 mg total) by mouth 2 (two) times daily as needed.    Dispense:  20 capsule    Refill:  0     I hope you are feeling better soon!  Seek in person care promptly if your symptoms worsen, new concerns arise or you are not improving with treatment.  It was nice to meet you today. I help [facility] out with telemedicine visits on Tuesdays and Thursdays and am happy to help if you need a virtual follow up visit on those days. Otherwise, if you have any concerns or questions following this visit please schedule a follow up visit with your Primary Care office or seek care at a local urgent care clinic to avoid delays in care. If you are having severe or life threatening symptoms please call 911 and/or go to the nearest emergency room.    MEDICARE VISIT:   -follow up yearly for annual wellness visit with primary care office  Here is a list of your preventive care/health maintenance measures and the plan for each if any are due:  PLAN For any measures below that may be due:    1. Please schedule mammogram.   2. Please let us  know if you wish to do the  Hep C screening  Health Maintenance  Topic Date Due   Hepatitis C Screening  Never done   Mammogram  09/29/2024   Medicare Annual Wellness (AWV)  11/06/2024   COVID-19 Vaccine (13 - 2025-26 season) 11/21/2024 (Originally 10/29/2024)   DEXA SCAN  11/06/2029 (Originally 10/21/2011)   DTaP/Tdap/Td (2 - Td or Tdap) 10/21/2026   Pneumococcal Vaccine: 50+ Years  Completed   Influenza Vaccine  Completed   Zoster Vaccines- Shingrix  Completed   Meningococcal B Vaccine  Aged Out   Hepatitis B Vaccines 19-59 Average Risk  Discontinued   Colonoscopy  Discontinued    -See a dentist at least yearly  -Get your eyes checked and then per your eye specialist's recommendations  -Other issues addressed today:   -I have included below further information regarding a healthy whole foods based diet, physical activity guidelines for adults, stress management and opportunities for social connections. I hope you find this information useful.   -----------------------------------------------------------------------------------------------------------------------------------------------------------------------------------------------------------------------------------------------------------    NUTRITION: -eat real food: lots of colorful vegetables (half the plate) and fruits -5-7 servings of vegetables and fruits per day (fresh or steamed is best), exp. 2 servings of vegetables with lunch and dinner and 2 servings of fruit per day. Berries and greens such as kale and collards are great choices.  -consume on a regular basis:  fresh fruits, fresh veggies, fish, nuts, seeds, healthy oils (such as olive oil, avocado oil), whole grains (make sure for bread/pasta/crackers/etc., that the first ingredient on label contains the word whole), legumes. -can eat  small amounts of dairy and lean meat (no larger than the palm of your hand), but avoid processed meats such as ham, bacon, lunch meat, etc. -drink  water  -try to avoid fast food and pre-packaged foods, processed meat, ultra processed foods/beverages (donuts, candy, etc.) -most experts advise limiting sodium to < 2300mg  per day, should limit further is any chronic conditions such as high blood pressure, heart disease, diabetes, etc. The American Heart Association advised that < 1500mg  is is ideal -try to avoid foods/beverages that contain any ingredients with names you do not recognize  -try to avoid foods/beverages  with added sugar or sweeteners/sweets  -try to avoid sweet drinks (including diet drinks): soda, juice, Gatorade, sweet tea, power drinks, diet drinks -try to avoid white rice, white bread, pasta (unless whole grain)  EXERCISE GUIDELINES FOR ADULTS: -if you wish to increase your physical activity, do so gradually and with the approval of your doctor -STOP and seek medical care immediately if you have any chest pain, chest discomfort or trouble breathing when starting or increasing exercise  -move and stretch your body, legs, feet and arms when sitting for long periods -Physical activity guidelines for optimal health in adults: -get at least 150 minutes per week of moderate exercise (can talk, but not sing); this is about 20-30 minutes of sustained activity 5-7 days per week or two 10-15 minute episodes of sustained activity 5-7 days per week -do some muscle building/resistance training/strength training at least 2 days per week  -balance exercises 3+ days per week:   Stand somewhere where you have something sturdy to hold onto if you lose balance    1) lift up on toes, then back down, start with 5x per day and work up to 20x   2) stand and lift one leg straight out to the side so that foot is a few inches of the floor, start with 5x each side and work up to 20x each side   3) stand on one foot, start with 5 seconds each side and work up to 20 seconds on each side  If you need ideas or help with getting more active:  -Silver  sneakers https://tools.silversneakers.com  -Walk with a Doc: Http://www.duncan-williams.com/  -try to include resistance (weight lifting/strength building) and balance exercises twice per week: or the following link for ideas: http://castillo-powell.com/  buyducts.dk  STRESS MANAGEMENT: -can try meditating, or just sitting quietly with deep breathing while intentionally relaxing all parts of your body for 5 minutes daily -if you need further help with stress, anxiety or depression please follow up with your primary doctor or contact the wonderful folks at [facility]: [phone]  SOCIAL CONNECTIONS: -options in [place] if you wish to engage in more social and exercise related activities:  -Silver sneakers https://tools.silversneakers.com  -Walk with a Doc: Http://www.duncan-williams.com/  -Check out the [facility] Active Adults 50+ section on the [place] of [employer] (hiking clubs, book clubs, cards and games, chess, exercise classes, aquatic classes and much more) - see the website for details: https://www.[place].gov/departments/parks-recreation/active-adults50  -YouTube has lots of exercise videos for different ages and abilities as well  -[facility] (a variety of indoor and outdoor inperson activities for adults). [phone]. [address].  -Virtual Online Classes (a variety of topics): see seniorplanet.org or call [phone]  -consider volunteering at a school, hospice center, church, senior center or elsewhere            Chiquita JONELLE Cramp, DO

## 2024-11-26 DIAGNOSIS — H25813 Combined forms of age-related cataract, bilateral: Secondary | ICD-10-CM | POA: Diagnosis not present

## 2024-11-27 DIAGNOSIS — L820 Inflamed seborrheic keratosis: Secondary | ICD-10-CM | POA: Diagnosis not present

## 2025-01-09 ENCOUNTER — Ambulatory Visit: Admitting: Internal Medicine

## 2025-02-10 ENCOUNTER — Ambulatory Visit: Admitting: Emergency Medicine

## 2025-03-27 ENCOUNTER — Ambulatory Visit: Admitting: Internal Medicine

## 2025-04-17 ENCOUNTER — Ambulatory Visit: Admitting: Internal Medicine
